# Patient Record
Sex: Male | Born: 1945 | Race: White | Hispanic: No | Marital: Married | Smoking: Former smoker
Health system: Southern US, Community
[De-identification: ages and names within clinical notes are randomized; demographics above are authoritative.]

## PROBLEM LIST (undated history)

## (undated) DIAGNOSIS — N1831 Chronic kidney disease, stage 3a: Secondary | ICD-10-CM

## (undated) DIAGNOSIS — Z860100 Personal history of colon polyps, unspecified: Secondary | ICD-10-CM

## (undated) DIAGNOSIS — G5600 Carpal tunnel syndrome, unspecified upper limb: Secondary | ICD-10-CM

## (undated) DIAGNOSIS — M1990 Unspecified osteoarthritis, unspecified site: Secondary | ICD-10-CM

## (undated) DIAGNOSIS — G2581 Restless legs syndrome: Secondary | ICD-10-CM

## (undated) DIAGNOSIS — K219 Gastro-esophageal reflux disease without esophagitis: Secondary | ICD-10-CM

## (undated) DIAGNOSIS — C4490 Unspecified malignant neoplasm of skin, unspecified: Secondary | ICD-10-CM

## (undated) DIAGNOSIS — K5792 Diverticulitis of intestine, part unspecified, without perforation or abscess without bleeding: Secondary | ICD-10-CM

## (undated) DIAGNOSIS — E119 Type 2 diabetes mellitus without complications: Secondary | ICD-10-CM

## (undated) DIAGNOSIS — M109 Gout, unspecified: Secondary | ICD-10-CM

## (undated) DIAGNOSIS — N400 Enlarged prostate without lower urinary tract symptoms: Secondary | ICD-10-CM

## (undated) DIAGNOSIS — C61 Malignant neoplasm of prostate: Secondary | ICD-10-CM

## (undated) DIAGNOSIS — G4700 Insomnia, unspecified: Secondary | ICD-10-CM

## (undated) DIAGNOSIS — I10 Essential (primary) hypertension: Secondary | ICD-10-CM

## (undated) DIAGNOSIS — J302 Other seasonal allergic rhinitis: Secondary | ICD-10-CM

## (undated) DIAGNOSIS — E7800 Pure hypercholesterolemia, unspecified: Secondary | ICD-10-CM

## (undated) HISTORY — PX: TONSILLECTOMY: SUR1361

## (undated) HISTORY — DX: Pure hypercholesterolemia, unspecified: E78.00

## (undated) HISTORY — DX: Essential (primary) hypertension: I10

## (undated) HISTORY — DX: Other seasonal allergic rhinitis: J30.2

## (undated) HISTORY — DX: Benign prostatic hyperplasia without lower urinary tract symptoms: N40.0

## (undated) HISTORY — DX: Personal history of colon polyps, unspecified: Z86.0100

## (undated) HISTORY — PX: HERNIA REPAIR: SHX51

## (undated) HISTORY — PX: TRIGGER FINGER RELEASE: SHX641

## (undated) HISTORY — PX: APPENDECTOMY: SHX54

## (undated) HISTORY — DX: Insomnia, unspecified: G47.00

## (undated) HISTORY — DX: Type 2 diabetes mellitus without complications: E11.9

## (undated) HISTORY — DX: Personal history of colonic polyps: Z86.010

## (undated) HISTORY — DX: Chronic kidney disease, stage 3a: N18.31

## (undated) HISTORY — DX: Restless legs syndrome: G25.81

## (undated) HISTORY — DX: Unspecified osteoarthritis, unspecified site: M19.90

---

## 2013-04-25 HISTORY — PX: COLONOSCOPY: SHX174

## 2014-05-12 ENCOUNTER — Telehealth: Payer: Self-pay

## 2014-05-12 NOTE — Telephone Encounter (Signed)
Referral notes given to Manuela Schwartz.

## 2014-05-12 NOTE — Telephone Encounter (Signed)
Pt is aware of OV for next Monday at 2pm with EG

## 2014-05-12 NOTE — Telephone Encounter (Signed)
Pt's wife Katharine Look) called to say that they had received a letter from [facility] about setting patient up for a colonoscopy.  Pt's wife said that she didn't think he needed a colonoscopy and he was having problems with his hiatal hernia. She said that Dr Spainhour's office was to send his medical records to [place]. Do you have those in your office?  If I need to make him an office visit I will need those if you have them. Please advise. [phone] or [phone]

## 2014-05-19 ENCOUNTER — Encounter: Payer: Self-pay | Admitting: Nurse Practitioner

## 2014-05-19 ENCOUNTER — Ambulatory Visit (INDEPENDENT_AMBULATORY_CARE_PROVIDER_SITE_OTHER): Payer: Medicare Other | Admitting: Nurse Practitioner

## 2014-05-19 ENCOUNTER — Other Ambulatory Visit: Payer: Self-pay

## 2014-05-19 VITALS — BP 118/70 | HR 87 | Temp 97.6°F | Ht 73.0 in | Wt 212.6 lb

## 2014-05-19 DIAGNOSIS — R0602 Shortness of breath: Secondary | ICD-10-CM | POA: Insufficient documentation

## 2014-05-19 DIAGNOSIS — K449 Diaphragmatic hernia without obstruction or gangrene: Secondary | ICD-10-CM | POA: Insufficient documentation

## 2014-05-19 DIAGNOSIS — R197 Diarrhea, unspecified: Secondary | ICD-10-CM | POA: Insufficient documentation

## 2014-05-19 DIAGNOSIS — R1013 Epigastric pain: Secondary | ICD-10-CM | POA: Insufficient documentation

## 2014-05-19 DIAGNOSIS — R079 Chest pain, unspecified: Secondary | ICD-10-CM

## 2014-05-19 MED ORDER — DICYCLOMINE HCL 10 MG PO CAPS: ORAL_CAPSULE | ORAL | Status: DC

## 2014-05-19 NOTE — Assessment & Plan Note (Signed)
Has diarrhea multiple times a week. Typically wakes up, has a cup of black coffee, small breakfast, and walks on the treadmill. Will then have a normal BM and about an hour later 1-2 additional but loose bowel movements. No recurrence later in the day. Associated with generalized abdominal pain. Just had a colonoscopy in 2015 with Dr. West Carbo with polypectomy x 1 which was dysplastic/adenomatous and reccommended surveillance in 5 years (2020). Will trial bentyl 10 mg once daily in the morning with breakfast to see if this improves his symptoms. Follow-up in 6 weeks.

## 2014-05-19 NOTE — Patient Instructions (Signed)
1. We will refer you to a cardiologist for further evaluation of your symptoms. 2. We will also plan on doing an EGD, possibly in 4 weeks, but would like cardiology to see you and clear you first. 3. For your diarrhea symptoms, start taking Bentyl once a day when you wake up in the morning and have your breakfast. 4. Will have you follow-up with [place] in 6 weeks to evaluate how you're doing, or sooner we can't get the cardiology and EGD done within the next 30 days.

## 2014-05-19 NOTE — Assessment & Plan Note (Signed)
Admits epigastric pain and shortness of breath typically after walking on the treadmill, as well as occasionally at rest, which tends to be relieved with TUMS. Has not mentioned this to his PCP as he attributed it to GI etiology. Has a history of hiatal hernia, per the patient, and occasional GERD symptoms. Likely his symptoms are related to these, but cannot rule out cardiac etiology. Patient referred to cardiology for evaluation and clearence. Will plan for EGD (as stated in hiatal hernia plan) after he has been seen by cards. Will follow-up in 6 weeks to re-evaluate as well as another visit within 30 days of EGD if cannot accomplish cardiology workup and EGD schedule within 30 days.  Proceed with EGD with Dr. Gala Romney in near future: the risks, benefits, and alternatives have been discussed with the patient in detail. The patient states understanding and desires to proceed.

## 2014-05-19 NOTE — Assessment & Plan Note (Signed)
History of hiatal hernia per patient. Feels a bulging in the epigastric region with abdominal flexion, but no real protrusion felt on exam. Having epigastric tenderness and shortness of breath. Rare GERD symptoms. Has never had hiatal hernia evaluated on endoscopy. Will plan for EGD to further evaluate for hernia existence and severity, as well as possible esophagitis/gastritis.  Proceed with EGD with Dr. Gala Romney in near future: the risks, benefits, and alternatives have been discussed with the patient in detail. The patient states understanding and desires to proceed.

## 2014-05-19 NOTE — Progress Notes (Signed)
Primary Care Physician:  Kendrick Ranch, MD Primary Gastroenterologist:  Dr. Gala Romney  Chief Complaint  Patient presents with  . Hiatal Hernia    HPI:   69 year old male presents to establish care, previous MD (Dr. West Carbo) in [place] recently retired. Last colonoscopy on 02/08/14 with polypectomy x 1 and pathology shower one piece hyperplastic and one piece adenomatous, reccommended repeat in 5 years (2020).   Today states he thinks he's having several problems. Has recently started exercising, walking on a treadmill 45 min 4-5 times a week. Starts having epigastric pain and shortness of breath which is relieved by TUMS. Also having occasional spells of shortness of breath with epigastric pain. When he does abdominal exercises "a little ball pokes out" in the epigastric area. Has a history of hiatal hernia and thinks it's attributed to this.  Also c/o "stool irregularity." His first morning stool is a 4 on the [facility] Scale, within 1 hour has another bowel movement which is a 7 on the scale and has to have an additional 1-2 bowel movements before he feels safe leaving the house. Typically he wakes up, walks the treadmill, has black coffee and something to eat. Will then have his first normal BM. Will linger around the house and about an hour later have the loose stool. Has not tried any treatments yet. This typically occurs several times a week. Has abdominal pain along with the loose stools which is relieved with his bowel movement.  Has had 2 occurrences of fecal incontinence as well. Has GERD symptoms about once every 4-8 weeks which is relieved with TUMS. Has not spoken to his PCP about this yet. Denies dysphagia. Denies any other upper or lower GI symptoms.  Past Medical History  Diagnosis Date  . Hypertension   . Hypercholesterolemia   . DM type 2 (diabetes mellitus, type 2)   . Prostate enlargement   . Restless leg syndrome   . Arthritis   . Insomnia   . Seasonal  allergies   . History of colon polyps     2015 polyp x 1 dysplastic and adenomatous pieces.    Past Surgical History  Procedure Laterality Date  . Tonsillectomy    . Appendectomy    . Hernia repair      x2  . Colonoscopy  2015    polypectomy x 1 pieces dysplastic and adenomatous; Recc repeat in 2020 (5 years).    Current Outpatient Prescriptions  Medication Sig Dispense Refill  . alfuzosin (UROXATRAL) 10 MG 24 hr tablet Take 10 mg by mouth daily with breakfast.    . amLODipine (NORVASC) 10 MG tablet Take 10 mg by mouth daily.    Marland Kitchen aspirin EC 81 MG tablet Take 81 mg by mouth daily.    Marland Kitchen atorvastatin (LIPITOR) 20 MG tablet Take 20 mg by mouth daily.    . Cholecalciferol (VITAMIN D3) 2000 UNITS TABS Take 2,000 Units by mouth daily.    . fluticasone (FLONASE) 50 MCG/ACT nasal spray Place 1 spray into both nostrils daily.    Marland Kitchen gabapentin (NEURONTIN) 600 MG tablet Take 600 mg by mouth 2 (two) times daily.    Marland Kitchen glipiZIDE (GLUCOTROL) 5 MG tablet Take 5 mg by mouth daily before breakfast.    . Liraglutide (VICTOZA [place]) Inject 1.2 mg into the skin.    Marland Kitchen losartan-hydrochlorothiazide (HYZAAR) 100-25 MG per tablet Take 1 tablet by mouth daily.    . metFORMIN (GLUCOPHAGE) 1000 MG tablet Take 1,000 mg by mouth 2 (  two) times daily with a meal.    . metoprolol tartrate (LOPRESSOR) 25 MG tablet Take 25 mg by mouth daily.    . Misc Natural Products (BLACK CHERRY CONCENTRATE PO) Take 1,000 mg by mouth daily.    . Potassium Gluconate 595 MG CAPS Take 595 mg by mouth.     No current facility-administered medications for this visit.    Allergies as of 05/19/2014 - Review Complete 05/19/2014  Allergen Reaction Noted  . Ciprofloxacin  05/19/2014  . Flagyl [metronidazole]  05/19/2014    Family History  Problem Relation Age of Onset  . Hypertension Mother   . Heart failure Mother   . Prostate cancer Father   . Hypertension Child   . Hypertension Child   . Hypertension Child   . High Cholesterol  Child   . High Cholesterol Child   . High Cholesterol Child     History   Social History  . Marital Status: Married    Spouse Name: N/A    Number of Children: N/A  . Years of Education: N/A   Occupational History  . Not on file.   Social History Main Topics  . Smoking status: Never Smoker   . Smokeless tobacco: Not on file  . Alcohol Use: 0.0 oz/week    0 Not specified per week     Comment: wine once a month  . Drug Use: No  . Sexual Activity: Not on file   Other Topics Concern  . Not on file   Social History Narrative  . No narrative on file    Review of Systems: Gen: Denies any fever, chills, fatigue, weight loss, lack of appetite.  CV: Denies chest pain, heart palpitations, peripheral edema, syncope.  Resp: Denies shortness of breath at rest or with exertion. Denies wheezing or cough.  GI: See HPI. Denies dysphagia or odynophagia. Denies jaundice, hematemesis, fecal incontinence. GU : Denies urinary burning, urinary frequency, urinary hesitancy MS: Denies joint pain, muscle weakness, cramps, or limitation of movement.  Derm: Denies rash, itching, dry skin Psych: Denies depression, anxiety, memory loss, and confusion Heme: Denies bruising, bleeding, and enlarged lymph nodes.  Physical Exam: BP 118/70 mmHg  Pulse 87  Temp(Src) 97.6 F (36.4 C) (Oral)  Ht 6\' 1"  (1.854 m)  Wt 212 lb 9.6 oz (96.435 kg)  BMI 28.06 kg/m2 General:   Alert and oriented. Pleasant and cooperative. Well-nourished and well-developed.  Head:  Normocephalic and atraumatic. Eyes:  Without icterus, sclera clear and conjunctiva pink.  Ears:  Normal auditory acuity. Mouth:  No deformity or lesions, oral mucosa pink. No OP edema. Neck:  Supple, without mass or thyromegaly. Lungs:  Clear to auscultation bilaterally. No wheezes, rales, or rhonchi. No distress.  Heart:  S1, S2 present without murmurs appreciated.  Abdomen:  +BS, soft, and non-distended. Mild epigastric TTP. No HSM noted. No  guarding or rebound. No masses appreciated. No hernias noted. Rectal:  Deferred  Msk:  Symmetrical without gross deformities. Normal posture. Pulses:  Normal DP pulses noted. Extremities:  Without clubbing or edema. Neurologic:  Alert and  oriented x4;  grossly normal neurologically. Skin:  Intact without significant lesions or rashes. Psych:  Alert and cooperative. Normal mood and affect.     05/19/2014 2:18 PM

## 2014-05-20 NOTE — Progress Notes (Signed)
cc'ed to pcp °

## 2014-07-03 ENCOUNTER — Encounter: Payer: Self-pay | Admitting: Nurse Practitioner

## 2014-07-03 ENCOUNTER — Ambulatory Visit (INDEPENDENT_AMBULATORY_CARE_PROVIDER_SITE_OTHER): Payer: Medicare Other | Admitting: Nurse Practitioner

## 2014-07-03 ENCOUNTER — Encounter (INDEPENDENT_AMBULATORY_CARE_PROVIDER_SITE_OTHER): Payer: Self-pay

## 2014-07-03 DIAGNOSIS — R197 Diarrhea, unspecified: Secondary | ICD-10-CM

## 2014-07-03 NOTE — Patient Instructions (Signed)
1. I have ordered a blood test for you to check for celiac disease 2. Avoid dairy for the next month 3. Return for re-evaluation in 1 month

## 2014-07-03 NOTE — Progress Notes (Signed)
Referring Provider: Jannett Celestine* Primary Care Physician:  Kendrick Ranch, MD Primary GI:  Dr. Gala Romney  Chief Complaint  Patient presents with  . Follow-up    HPI:   69 year old male presents for follow-up on previous visit for epigastric pain, hiatal hernia, and diarrhea. Symptoms at that time included epigastric pain worse with flexion and shortness of breath which was worse with walking on a treadmill and occasionally at rest. TUMS provided some relief. History of occasional GERD symptoms and hiatal hernia. Was referred to cardiology to rule out cardiac etiology and possible EGD after that for GI evaluation of hernia and possible esophagitis/gastritis. Was also complaining of diarrhea multiple times a week in the morning where he would have 1 normal stool and 1-2 additional loose stools about an hour after that. Was given Rx for bentyl and schedule follow-up.   Today states he saw a cardiologist with [facility], had a treadmill stress test which he states was normal. States the Bentyl has not changed his stool pattern much. This pattern started about 12-18 months ago, although he has always had a history of loose bowels. Had a colonoscopy Fall of last year with recommended 5 year follow-up. Frequent morning stools are proceeded by abdominal cramping which is relieved by defecation. His epigastric pain and symptoms have resolved completely. States he had previously been doing abdominal exercises. He since hurt his back and had to stop the exercises. Since then his epigastric symptoms resolved. Was told he may have a small hernia. Denies hematochezia, melena, other abdominal pain, N/V. Has occasional dyspepsia after spicy or acidic foods but they're well controlled with diet control and occasional OTC antacids. Denies any other upper or lower GI symptoms.  Past Medical History  Diagnosis Date  . Hypertension   . Hypercholesterolemia   . DM type 2 (diabetes  mellitus, type 2)   . Prostate enlargement   . Restless leg syndrome   . Arthritis   . Insomnia   . Seasonal allergies   . History of colon polyps     2015 polyp x 1 dysplastic and adenomatous pieces.    Past Surgical History  Procedure Laterality Date  . Tonsillectomy    . Appendectomy    . Hernia repair      inguinal x2  . Colonoscopy  2015    polypectomy x 1 pieces dysplastic and adenomatous; Recc repeat in 2020 (5 years).    Current Outpatient Prescriptions  Medication Sig Dispense Refill  . alfuzosin (UROXATRAL) 10 MG 24 hr tablet Take 10 mg by mouth daily with breakfast.    . amLODipine (NORVASC) 10 MG tablet Take 10 mg by mouth daily.    Marland Kitchen aspirin EC 81 MG tablet Take 81 mg by mouth daily.    Marland Kitchen atorvastatin (LIPITOR) 20 MG tablet Take 20 mg by mouth daily.    . Cholecalciferol (VITAMIN D3) 2000 UNITS TABS Take 2,000 Units by mouth daily.    Marland Kitchen dicyclomine (BENTYL) 10 MG capsule Take 1 tab daily in the morning when waking. 30 capsule 1  . fluticasone (FLONASE) 50 MCG/ACT nasal spray Place 1 spray into both nostrils daily.    Marland Kitchen gabapentin (NEURONTIN) 600 MG tablet Take 600 mg by mouth 2 (two) times daily.    Marland Kitchen glipiZIDE (GLUCOTROL) 5 MG tablet Take 5 mg by mouth daily before breakfast.    . Liraglutide (VICTOZA [place]) Inject 1.2 mg into the skin.    Marland Kitchen losartan-hydrochlorothiazide (HYZAAR) 100-25 MG per tablet  Take 1 tablet by mouth daily.    . metFORMIN (GLUCOPHAGE) 1000 MG tablet Take 1,000 mg by mouth 2 (two) times daily with a meal.    . metoprolol tartrate (LOPRESSOR) 25 MG tablet Take 25 mg by mouth 2 (two) times daily.     . Misc Natural Products (BLACK CHERRY CONCENTRATE PO) Take 1,000 mg by mouth daily.    . Potassium Gluconate 595 MG CAPS Take 595 mg by mouth.     No current facility-administered medications for this visit.    Allergies as of 07/03/2014 - Review Complete 07/03/2014  Allergen Reaction Noted  . Ciprofloxacin  05/19/2014  . Flagyl [metronidazole]   05/19/2014    Family History  Problem Relation Age of Onset  . Hypertension Mother   . Heart failure Mother   . Prostate cancer Father   . Hypertension Child   . Hypertension Child   . Hypertension Child   . High Cholesterol Child   . High Cholesterol Child   . High Cholesterol Child   . Colon cancer Neg Hx     History   Social History  . Marital Status: Married    Spouse Name: N/A  . Number of Children: N/A  . Years of Education: N/A   Social History Main Topics  . Smoking status: Former Smoker    Quit date: 05/20/1979  . Smokeless tobacco: Former Systems developer    Quit date: 05/20/1979  . Alcohol Use: 0.0 oz/week    0 Standard drinks or equivalent per week     Comment: wine once a month  . Drug Use: No  . Sexual Activity: Not on file   Other Topics Concern  . None   Social History Narrative    Review of Systems: Gen: Denies fever, chills, anorexia. Denies fatigue, weight loss, change in appetite. CV: Denies chest pain, palpitations, syncope. Resp: Denies dyspnea at rest, wheezing, coughing up blood. GI: Denies vomiting blood, jaundice, and fecal incontinence. Denies dysphagia or odynophagia. Derm: Denies rash, itching, dry skin Psych: Denies depression, anxiety, memory loss, confusion. Heme: Denies bruising, bleeding, and enlarged lymph nodes.  Physical Exam: BP 118/70 mmHg  Pulse 73  Temp(Src) 98.1 F (36.7 C) (Oral)  Ht 6' (1.829 m)  Wt 212 lb (96.163 kg)  BMI 28.75 kg/m2 General:   Alert and oriented. No distress noted. Pleasant and cooperative.  Neck:  Supple, without mass or thyromegaly. Lungs:  Clear to auscultation bilaterally. No wheezes, rales, or rhonchi. No distress.  Heart:  S1, S2 present without murmurs, rubs, or gallops. Regular rate and rhythm. Abdomen:  +BS, soft, non-tender and non-distended. No rebound or guarding. No HSM or masses noted. With demonstrated abdominal exercises noted very minor possible hernia protrusion after several  repetitions; not apparent otherwise. Extremities:  Without edema. Neurologic:  Alert and  oriented x4;  grossly normal neurologically. Skin:  Intact without significant lesions or rashes. Psych:  Alert and cooperative. Normal mood and affect.    07/03/2014 10:31 AM

## 2014-07-03 NOTE — Assessment & Plan Note (Addendum)
Daily parretn of one normal bowel movement followed by 2-3 loose stools afterward with some associated abdominal cramping that is relieved by defecation. Denies any red flag/warning signs like gi bleed, severe abdominal pain, weight loss, change in appetite. Bentyl with little to no effect. Will advise to avoid daily products and check tissue transglutaminase IgA for celiac. Follow-up in 4 months. Other etiologies include IBS-D, pancreatic insufficiency. Full cardiac records requested. If labs return normal and no improvement with dietary modification will discuss with Dr. Gala Romney for further insight.

## 2014-07-04 ENCOUNTER — Telehealth: Payer: Self-pay | Admitting: Nurse Practitioner

## 2014-07-04 LAB — TISSUE TRANSGLUTAMINASE, IGA: TISSUE TRANSGLUTAMINASE AB, IGA: 1 U/mL (ref <4)

## 2014-07-04 NOTE — Telephone Encounter (Signed)
His tissue transglutaminase IgA was marked reviewed in error. Please notify patient it was negative (no apparent allergy to gluten.) Continue to avoid dairy and follow the plan as we discussed. Return per scheduled appointment or sooner for worsening symptoms. Please notify patient.

## 2014-07-04 NOTE — Progress Notes (Signed)
CC'ED TO PCP 

## 2014-07-07 NOTE — Telephone Encounter (Signed)
Pt is aware of results. 

## 2014-07-30 ENCOUNTER — Telehealth: Payer: Self-pay | Admitting: Nurse Practitioner

## 2014-07-30 NOTE — Telephone Encounter (Signed)
PATIENT WIFE SANDRA CALLED AND STATED THAT THE PATIENT HAS NOT FOLLOWED THE DAIRY FREE DIET THAT WAS RECOMMENDED AND WANTED TO KNOW IF THAT WAS WHAT WAS BEING LOOKED AT WAS IF THE PATIENT WAS LACTOSE INTOLERANT.  IF SO SHOULD HE STILL KEEP HIS APPOINTMENT Monday OR SHOULD HE TRY OTC LACTOSE PILLS  PLEASE ADVISE [phone]

## 2014-07-31 NOTE — Telephone Encounter (Signed)
Routing to EG 

## 2014-08-01 NOTE — Telephone Encounter (Signed)
Yes it is one option for what's being looked at. I would again recommend abstaining from dairy product for at least a full week to give [place] an idea if his symptoms improve by doing so. May need to push his appointment back some to allow for this. Can keep appointment if symptoms are severe.

## 2014-08-04 ENCOUNTER — Ambulatory Visit: Payer: Medicare Other | Admitting: Nurse Practitioner

## 2014-08-04 NOTE — Telephone Encounter (Signed)
Tried to call pt- LMOM with instructions.  

## 2014-08-04 NOTE — Telephone Encounter (Signed)
pts ov has been changed to 08/20/14.

## 2014-08-20 ENCOUNTER — Encounter: Payer: Self-pay | Admitting: Nurse Practitioner

## 2014-08-20 ENCOUNTER — Ambulatory Visit (INDEPENDENT_AMBULATORY_CARE_PROVIDER_SITE_OTHER): Payer: Medicare Other | Admitting: Nurse Practitioner

## 2014-08-20 VITALS — BP 108/69 | HR 72 | Temp 97.0°F | Ht 72.0 in | Wt 212.2 lb

## 2014-08-20 DIAGNOSIS — R197 Diarrhea, unspecified: Secondary | ICD-10-CM | POA: Diagnosis not present

## 2014-08-20 DIAGNOSIS — R1013 Epigastric pain: Secondary | ICD-10-CM

## 2014-08-20 MED ORDER — HYOSCYAMINE SULFATE 0.125 MG SL SUBL: 0.1250 mg | SUBLINGUAL_TABLET | SUBLINGUAL | Status: DC | PRN

## 2014-08-20 NOTE — Progress Notes (Signed)
Referring Provider: Jannett Celestine* Primary Care Physician:  Kendrick Ranch, MD Primary GI: Dr. Gala Romney  Chief Complaint  Patient presents with  . Follow-up    HPI:   69 year old male presents for follow-up on abdominal pain and diarrhea. Previously was attempted on Bentyl with little no effect. Tissue transglutaminase IgA negative for celiac disease. Been seen and evaluated by cardiology who pursued and exercise stress test and echo both of which were normal per the results received. Reviewed cardiology notes and full.  Today he states he has avoided daily fairly diligently without any improvement in symptoms. Will still have an initial morning bowel movement which is consistent with [place] 4. Will have 2-3 additional episodes after that, with about an hour in between, which are [place] 5-7. Has kept a diligent foot and stool diary. He does not have further symptoms later in the day, it is only in the morning. Denies hematochezia and melena. Will have abdominal discomfort prior to his morning bowel movement which is relieved with defecation. His GERD symptoms have since subsided. Symptoms improved with slowing his eating. Wants to hold off on endoscopy for now.   Past Medical History  Diagnosis Date  . Hypertension   . Hypercholesterolemia   . DM type 2 (diabetes mellitus, type 2)   . Prostate enlargement   . Restless leg syndrome   . Arthritis   . Insomnia   . Seasonal allergies   . History of colon polyps     2015 polyp x 1 dysplastic and adenomatous pieces.    Past Surgical History  Procedure Laterality Date  . Tonsillectomy    . Appendectomy    . Hernia repair      inguinal x2  . Colonoscopy  2015    polypectomy x 1 pieces dysplastic and adenomatous; Recc repeat in 2020 (5 years).    Current Outpatient Prescriptions  Medication Sig Dispense Refill  . alfuzosin (UROXATRAL) 10 MG 24 hr tablet Take 10 mg by mouth daily with breakfast.    .  amLODipine (NORVASC) 10 MG tablet Take 10 mg by mouth daily.    Marland Kitchen aspirin EC 81 MG tablet Take 81 mg by mouth daily.    Marland Kitchen atorvastatin (LIPITOR) 20 MG tablet Take 20 mg by mouth daily.    . Cholecalciferol (VITAMIN D3) 2000 UNITS TABS Take 2,000 Units by mouth daily.    Marland Kitchen dicyclomine (BENTYL) 10 MG capsule Take 1 tab daily in the morning when waking. 30 capsule 1  . fluticasone (FLONASE) 50 MCG/ACT nasal spray Place 1 spray into both nostrils daily.    Marland Kitchen gabapentin (NEURONTIN) 600 MG tablet Take 600 mg by mouth 2 (two) times daily.    Marland Kitchen glipiZIDE (GLUCOTROL) 5 MG tablet Take 5 mg by mouth daily before breakfast.    . Liraglutide (VICTOZA [place]) Inject 1.2 mg into the skin.    Marland Kitchen losartan-hydrochlorothiazide (HYZAAR) 100-25 MG per tablet Take 1 tablet by mouth daily.    . metFORMIN (GLUCOPHAGE) 1000 MG tablet Take 1,000 mg by mouth 2 (two) times daily with a meal.    . metoprolol tartrate (LOPRESSOR) 25 MG tablet Take 25 mg by mouth 2 (two) times daily.     . Misc Natural Products (BLACK CHERRY CONCENTRATE PO) Take 1,000 mg by mouth daily.    . Potassium Gluconate 595 MG CAPS Take 595 mg by mouth.     No current facility-administered medications for this visit.    Allergies as of 08/20/2014 - Review Complete  08/20/2014  Allergen Reaction Noted  . Ciprofloxacin  05/19/2014  . Flagyl [metronidazole]  05/19/2014    Family History  Problem Relation Age of Onset  . Hypertension Mother   . Heart failure Mother   . Prostate cancer Father   . Hypertension Child   . Hypertension Child   . Hypertension Child   . High Cholesterol Child   . High Cholesterol Child   . High Cholesterol Child   . Colon cancer Neg Hx     History   Social History  . Marital Status: Married    Spouse Name: N/A  . Number of Children: N/A  . Years of Education: N/A   Social History Main Topics  . Smoking status: Former Smoker    Quit date: 05/20/1979  . Smokeless tobacco: Former Systems developer    Quit date: 05/20/1979   . Alcohol Use: 0.0 oz/week    0 Standard drinks or equivalent per week     Comment: wine once a month  . Drug Use: No  . Sexual Activity: Not on file   Other Topics Concern  . None   Social History Narrative    Review of Systems: General: Negative for anorexia, weight loss, fever, chills, fatigue, weakness. Eyes: Negative for vision changes.  CV: Negative for chest pain, angina, palpitations, peripheral edema.  Respiratory: Negative for dyspnea at rest, cough, wheezing.  GI: See history of present illness. Derm: Negative for rash or itching.  Neuro: Negative for weakness, seizure, memory loss, confusion.  Psych: Negative for anxiety, depression.  Endo: Negative for unusual weight change.  Heme: Negative for bruising or bleeding. Allergy: Negative for rash or hives.   Physical Exam: BP 108/69 mmHg  Pulse 72  Temp(Src) 97 F (36.1 C)  Ht 6' (1.829 m)  Wt 212 lb 3.2 oz (96.253 kg)  BMI 28.77 kg/m2 General:   Alert and oriented. No distress noted. Pleasant and cooperative.  Head:  Normocephalic and atraumatic. Eyes:  Conjuctiva clear without scleral icterus. Lungs:  Clear to auscultation bilaterally. No wheezes, rales, or rhonchi. No distress.  Heart:  S1, S2 present without murmurs, rubs, or gallops. Regular rate and rhythm. Abdomen:  +BS, soft, non-tender and non-distended. No rebound or guarding. No HSM or masses noted. Msk:  Symmetrical without gross deformities. Normal posture. Extremities:  Without edema. Neurologic:  Alert and  oriented x4;  grossly normal neurologically. Skin:  Intact without significant lesions or rashes. Psych:  Alert and cooperative. Normal mood and affect.    08/20/2014 1:51 PM

## 2014-08-20 NOTE — Assessment & Plan Note (Signed)
Patient with continued abdominal pain and unusual bowel pattern. Recent colonoscopy normal except for polypectomy last year. He continues to wake up in the morning approximately 30 minutes after eating has a single bowel movement which is normal, 30-60 minutes later has a second and then third and and often fourth bowel movement which is progressively loose to the point of diarrhea. Has tried Bentyl without relief. Tissue transglutaminase IgA negative. Has done a trial period of diligent dairy avoidance with no improvement. At this point potentially due to incomplete emptying and will attempt to bulk his stools. Recommend fiber supplementation, probiotic, and Levsin 0.125 mg sublingual every 4 hours as needed. Return for reevaluation in 8 weeks. No red flag/warning signs or symptoms at this ti follow-up can continue further workup if no improvement to include possible IBS D.

## 2014-08-20 NOTE — Patient Instructions (Signed)
1. I'm calling in a prescription for Levsin 0.25 mg. 1 to resolve tablet under your tongue up to every 4 hours as needed. Initially start taking in the morning upon waking before eating or drinking anything. 2. Add a fiber supplement. There are several good over-the-counter supplements such as fiber dummies, Benefiber, and others. He can consult with the pharmacist to help select the best time for you. 3. Start taking a daily probiotic. These were also over-the-counter in her several good options. I provided you with a coupon for money off on one of these options. Feel free to consult with the pharmacist for select in the best time for you. 4. Return for follow-up in 8 weeks with Dr. Gala Romney.

## 2014-08-25 ENCOUNTER — Encounter: Payer: Self-pay | Admitting: Internal Medicine

## 2014-08-25 ENCOUNTER — Telehealth: Payer: Self-pay | Admitting: Internal Medicine

## 2014-08-25 MED ORDER — HYOSCYAMINE SULFATE 0.125 MG SL SUBL: 0.1250 mg | SUBLINGUAL_TABLET | SUBLINGUAL | Status: DC | PRN

## 2014-08-25 NOTE — Telephone Encounter (Signed)
Pt was seen recently be [facility] and the rx (Levsin) was written for twice a day, but the pharmacy only filled it with 30 pills not a 30 day supply. Patient is going out of town leaving possibly tomorrow or either Wednesday morning and needs this corrected. He uses [facility] [address] in [place]. Any questions call his cell at [phone]

## 2014-08-25 NOTE — Telephone Encounter (Signed)
I spoke with the pt, he said so far this is working well for him. His rx says to take it qid prn and he is taking it bid prn.  He is going out of town next week and will run out before he gets back. He is aware that he needs ov with RMR in 8 weeks. Spoke with EG ok to re-send in rx with 2 refills. rx has been sent in.  Pt said he was not scheduled with RMR yet. Please make sure pt gets an appt with RMR in 8 weeks.

## 2014-08-25 NOTE — Telephone Encounter (Signed)
APPOINTMENT MADE AND LETTER SENT °

## 2014-08-26 NOTE — Progress Notes (Signed)
CC'ED TO PCP 

## 2014-10-20 ENCOUNTER — Ambulatory Visit (INDEPENDENT_AMBULATORY_CARE_PROVIDER_SITE_OTHER): Payer: Medicare Other | Admitting: Internal Medicine

## 2014-10-20 ENCOUNTER — Encounter: Payer: Self-pay | Admitting: Internal Medicine

## 2014-10-20 VITALS — BP 119/71 | HR 73 | Temp 96.9°F | Ht 72.0 in | Wt 212.0 lb

## 2014-10-20 DIAGNOSIS — K589 Irritable bowel syndrome without diarrhea: Secondary | ICD-10-CM | POA: Diagnosis not present

## 2014-10-20 MED ORDER — HYOSCYAMINE SULFATE 0.125 MG SL SUBL: 0.1250 mg | SUBLINGUAL_TABLET | Freq: Every day | SUBLINGUAL | Status: DC

## 2014-10-20 NOTE — Progress Notes (Signed)
Primary Care Physician:  Kendrick Ranch, MD Primary Gastroenterologist:  Dr. Gala Romney  Pre-Procedure History & Physical: HPI:  Glen Guerrero. is a 69 y.o. male here for  Follow-up of early morning diarrhea. Since starting Levsin one dose each morning,  fiber and probiotic, patient states bowel function has normalized; he does have 1-2 bowel movements in rapid succession in the mornings  - sometimes it is loose. However, only 2 daily. No episodes of incontinence since last visit. No bleeding. History of adenoma removed from his colon last year. Will be due for surveillance examination in 4 years. He has been on and off metformin over the years.  Past Medical History  Diagnosis Date  . Hypertension   . Hypercholesterolemia   . DM type 2 (diabetes mellitus, type 2)   . Prostate enlargement   . Restless leg syndrome   . Arthritis   . Insomnia   . Seasonal allergies   . History of colon polyps     2015 polyp x 1 dysplastic and adenomatous pieces.    Past Surgical History  Procedure Laterality Date  . Tonsillectomy    . Appendectomy    . Hernia repair      inguinal x2  . Colonoscopy  2015    Dr.Spainhour- polypectomy x 1 pieces dysplastic and adenomatous; Recc repeat in 2020 (5 years).    Prior to Admission medications   Medication Sig Start Date End Date Taking? Authorizing Provider  alfuzosin (UROXATRAL) 10 MG 24 hr tablet Take 10 mg by mouth daily with breakfast.   Yes Historical Provider, MD  amLODipine (NORVASC) 10 MG tablet Take 10 mg by mouth daily.   Yes Historical Provider, MD  aspirin EC 81 MG tablet Take 81 mg by mouth daily.   Yes Historical Provider, MD  atorvastatin (LIPITOR) 20 MG tablet Take 20 mg by mouth daily.   Yes Historical Provider, MD  Cholecalciferol (VITAMIN D3) 2000 UNITS TABS Take 2,000 Units by mouth daily.   Yes Historical Provider, MD  dicyclomine (BENTYL) 10 MG capsule Take 1 tab daily in the morning when waking. 05/19/14  Yes Carlis Stable, NP  fluticasone (FLONASE) 50 MCG/ACT nasal spray Place 1 spray into both nostrils daily.   Yes Historical Provider, MD  gabapentin (NEURONTIN) 600 MG tablet Take 600 mg by mouth 2 (two) times daily.   Yes Historical Provider, MD  glipiZIDE (GLUCOTROL) 5 MG tablet Take 5 mg by mouth daily before breakfast.   Yes Historical Provider, MD  hyoscyamine (LEVSIN/SL) 0.125 MG SL tablet Place 1 tablet (0.125 mg total) under the tongue every 4 (four) hours as needed. 08/25/14  Yes Carlis Stable, NP  Liraglutide (VICTOZA [place]) Inject 1.2 mg into the skin.   Yes Historical Provider, MD  losartan-hydrochlorothiazide (HYZAAR) 100-25 MG per tablet Take 1 tablet by mouth daily.   Yes Historical Provider, MD  metFORMIN (GLUCOPHAGE) 1000 MG tablet Take 1,000 mg by mouth 2 (two) times daily with a meal.   Yes Historical Provider, MD  metoprolol tartrate (LOPRESSOR) 25 MG tablet Take 25 mg by mouth 2 (two) times daily.    Yes Historical Provider, MD  Misc Natural Products (BLACK CHERRY CONCENTRATE PO) Take 1,000 mg by mouth daily.   Yes Historical Provider, MD  Potassium Gluconate 595 MG CAPS Take 595 mg by mouth.   Yes Historical Provider, MD  Probiotic Product (ALIGN) 4 MG CAPS Take 4 mg by mouth daily.   Yes Historical Provider, MD  Allergies as of 10/20/2014 - Review Complete 10/20/2014  Allergen Reaction Noted  . Ciprofloxacin  05/19/2014  . Flagyl [metronidazole]  05/19/2014    Family History  Problem Relation Age of Onset  . Hypertension Mother   . Heart failure Mother   . Prostate cancer Father   . Hypertension Child   . Hypertension Child   . Hypertension Child   . High Cholesterol Child   . High Cholesterol Child   . High Cholesterol Child   . Colon cancer Neg Hx     History   Social History  . Marital Status: Married    Spouse Name: N/A  . Number of Children: N/A  . Years of Education: N/A   Occupational History  . Not on file.   Social History Main Topics  . Smoking status:  Former Smoker    Quit date: 05/20/1979  . Smokeless tobacco: Former Systems developer    Quit date: 05/20/1979  . Alcohol Use: 0.0 oz/week    0 Standard drinks or equivalent per week     Comment: wine once a month  . Drug Use: No  . Sexual Activity: Not on file   Other Topics Concern  . Not on file   Social History Narrative    Review of Systems: See HPI, otherwise negative ROS  Physical Exam: BP 119/71 mmHg  Pulse 73  Temp(Src) 96.9 F (36.1 C) (Oral)  Ht 6' (1.829 m)  Wt 212 lb (96.163 kg)  BMI 28.75 kg/m2 General:   Alert,  Well-developed, well-nourished, pleasant and cooperative in NAD. Accompanied by spouse the Skin:  Intact without significant lesions or rashes. Eyes:  Sclera clear, no icterus.   Conjunctiva pink. Ears:  Normal auditory acuity. Nose:  No deformity, discharge,  or lesions. Mouth:  No deformity or lesions. Neck:  Supple; no masses or thyromegaly. No significant cervical adenopathy. Lungs:  Clear throughout to auscultation.   No wheezes, crackles, or rhonchi. No acute distress. Heart:  Regular rate and rhythm; no murmurs, clicks, rubs,  or gallops. Abdomen: Non-distended, normal bowel sounds.  Soft and nontender without appreciable mass or hepatosplenomegaly.  Pulses:  Normal pulses noted. Extremities:  Without clubbing or edema.  Impression:  69 year old gentleman with early intermittent early morning diarrhea likely related to an element of irritable bowel syndrome. Diabetic enteropathy and medication effect (metformin)  not excluded. Clinically, marked improvement with the new regimen instituted since his last office visit as outlined above.  Recommendations:  Continue Levsin daily.  Continue fiber daily - try one dose daily.  Continue probiotic daily.Repeat colonoscopy in 4 years. Office visit in 6 months      Notice: This dictation was prepared with Dragon dictation along with smaller phrase technology. Any transcriptional errors that result from this  process are unintentional and may not be corrected upon review.

## 2014-10-20 NOTE — Patient Instructions (Signed)
Continue Levsin daily  Continue fiber daily - try one dose daily  Continue probiotic daily.  Repeat colonoscopy in 4 years  Office visit in 6 months

## 2015-03-11 ENCOUNTER — Encounter: Payer: Self-pay | Admitting: Internal Medicine

## 2017-10-24 ENCOUNTER — Encounter

## 2017-10-24 ENCOUNTER — Other Ambulatory Visit: Payer: Self-pay | Admitting: *Deleted

## 2017-10-24 ENCOUNTER — Telehealth: Payer: Self-pay | Admitting: *Deleted

## 2017-10-24 ENCOUNTER — Ambulatory Visit (INDEPENDENT_AMBULATORY_CARE_PROVIDER_SITE_OTHER): Payer: Medicare Other | Admitting: Internal Medicine

## 2017-10-24 ENCOUNTER — Encounter: Payer: Self-pay | Admitting: *Deleted

## 2017-10-24 ENCOUNTER — Encounter: Payer: Self-pay | Admitting: Internal Medicine

## 2017-10-24 VITALS — BP 112/72 | HR 55 | Temp 97.1°F | Ht 72.0 in | Wt 213.2 lb

## 2017-10-24 DIAGNOSIS — K921 Melena: Secondary | ICD-10-CM

## 2017-10-24 DIAGNOSIS — Z86010 Personal history of colonic polyps: Secondary | ICD-10-CM

## 2017-10-24 DIAGNOSIS — K580 Irritable bowel syndrome with diarrhea: Secondary | ICD-10-CM

## 2017-10-24 MED ORDER — NA SULFATE-K SULFATE-MG SULF 17.5-3.13-1.6 GM/177ML PO SOLN: 1.0000 | ORAL | 0 refills | Status: DC

## 2017-10-24 NOTE — Patient Instructions (Addendum)
Schedule a diagnostic colonoscopy - hematochezia and hx of polyps - conscious sedation  Pamphlet on hemorrhoid banding provided  Further recommendations to follow

## 2017-10-24 NOTE — Telephone Encounter (Signed)
Per RMR diabetic instructions for TCS: No glucotrol day of procedure, no jardiance day before/day of procedure, lantus 10 units at bedtime the night before procedure. Will provide instructions for lantus after procedure.

## 2017-10-24 NOTE — Progress Notes (Signed)
Primary Care Physician:  Kendrick Ranch, MD Primary Gastroenterologist:  Dr. Gala Romney  Pre-Procedure History & Physical: HPI:  Glen Guerrero. is a 72 y.o. male here for Further evaluation of rectal bleeding. Patient states he has known hemorrhoids which protrude from time to time when he is having a bowel movement. Bleeding has been noted from time to time recently with wiping - sometimes at other times as well. His IBS symptoms have improved since he came off metformin.  History colonic adenomas removed in [place]; due for surveillance colonoscopy 2020. Hasn't had any upper GI tract symptoms such as odynophagia, dysphagia, early satiety, nausea vomiting. He denies melena. Previously prescribed Levsin for IBS but since coming off metformin hasn't needed to take anything. He does sometimes spend a prolonged amount of time on the toilet in achieving a bowel movement.  Past Medical History:  Diagnosis Date  . Arthritis   . DM type 2 (diabetes mellitus, type 2) ([place])   . History of colon polyps    2015 polyp x 1 dysplastic and adenomatous pieces.  . Hypercholesterolemia   . Hypertension   . Insomnia   . Prostate enlargement   . Restless leg syndrome   . Seasonal allergies     Prior to Admission medications   Medication Sig Start Date End Date Taking? Authorizing Provider  alfuzosin (UROXATRAL) 10 MG 24 hr tablet Take 10 mg by mouth daily with breakfast.   Yes [provider]  amLODipine (NORVASC) 10 MG tablet Take 10 mg by mouth daily.   Yes [provider]  aspirin EC 81 MG tablet Take 81 mg by mouth daily.   Yes [provider]  atorvastatin (LIPITOR) 20 MG tablet Take 20 mg by mouth daily.   Yes [provider]  Cholecalciferol (VITAMIN D3) 2000 UNITS TABS Take 2,000 Units by mouth daily.   Yes [provider]  gabapentin (NEURONTIN) 600 MG tablet Take 1,200 mg by mouth at bedtime.    Yes [provider]    glipiZIDE (GLUCOTROL) 5 MG tablet Take 5 mg by mouth daily before breakfast.   Yes [provider]  JARDIANCE 25 MG TABS tablet Take 25 mg by mouth daily. 08/19/17  Yes [provider]  LANTUS SOLOSTAR 100 UNIT/ML Solostar Pen Inject 36 Units into the skin at bedtime. 10/05/17  Yes [provider]  loratadine (CLARITIN) 10 MG tablet Take 10 mg by mouth daily as needed for allergies.   Yes [provider]  losartan-hydrochlorothiazide (HYZAAR) 100-25 MG per tablet Take 0.5 tablets by mouth daily.    Yes [provider]  metoprolol tartrate (LOPRESSOR) 25 MG tablet Take 25 mg by mouth 2 (two) times daily.    Yes [provider]  Misc Natural Products (BLACK CHERRY CONCENTRATE PO) Take 1,000 mg by mouth 2 (two) times daily.    Yes [provider]  NON FORMULARY Uses CPAP nightly   Yes [provider]  Potassium Gluconate 595 MG CAPS Take 595 mg by mouth.   Yes [provider]  dicyclomine (BENTYL) 10 MG capsule Take 1 tab daily in the morning when waking. Patient not taking: Reported on 10/24/2017 05/19/14   Carlis Stable, NP  fluticasone [facility]) 50 MCG/ACT nasal spray Place 1 spray into both nostrils daily.    [provider]  hyoscyamine (LEVSIN SL) 0.125 MG SL tablet Place 1 tablet (0.125 mg total) under the tongue daily. Patient not taking: Reported on 10/24/2017 10/20/14  Anglia Blakley, Cristopher Estimable, MD  hyoscyamine (LEVSIN/SL) 0.125 MG SL tablet Place 1 tablet (0.125 mg total) under the tongue every 4 (four) hours as needed. Patient not taking: Reported on 10/24/2017 08/25/14   Carlis Stable, NP  Liraglutide (VICTOZA [place]) Inject 1.2 mg into the skin.    [provider]  metFORMIN (GLUCOPHAGE) 1000 MG tablet Take 1,000 mg by mouth 2 (two) times daily with a meal.    [provider]  Probiotic Product (ALIGN) 4 MG CAPS Take 4 mg by mouth daily.    [provider]    Allergies as of 10/24/2017 - Review  Complete 10/24/2017  Allergen Reaction Noted  . Ciprofloxacin  05/19/2014  . Flagyl [metronidazole]  05/19/2014  . Tramadol Nausea And Vomiting 10/24/2017    Family History  Problem Relation Age of Onset  . Hypertension Mother   . Heart failure Mother   . Prostate cancer Father   . Hypertension Child   . Hypertension Child   . Hypertension Child   . High Cholesterol Child   . High Cholesterol Child   . High Cholesterol Child   . Colon cancer Neg Hx     Social History   Socioeconomic History  . Marital status: Married    Spouse name: Not on file  . Number of children: Not on file  . Years of education: Not on file  . Highest education level: Not on file  Occupational History  . Not on file  Social Needs  . Financial resource strain: Not on file  . Food insecurity:    Worry: Not on file    Inability: Not on file  . Transportation needs:    Medical: Not on file    Non-medical: Not on file  Tobacco Use  . Smoking status: Former Smoker    Last attempt to quit: 05/20/1979    Years since quitting: 38.4  . Smokeless tobacco: Former Systems developer    Quit date: 05/20/1979  Substance and Sexual Activity  . Alcohol use: Yes    Alcohol/week: 0.0 oz    Comment: wine once a week; occ whiskey  . Drug use: No  . Sexual activity: Not on file  Lifestyle  . Physical activity:    Days per week: Not on file    Minutes per session: Not on file  . Stress: Not on file  Relationships  . Social connections:    Talks on phone: Not on file    Gets together: Not on file    Attends religious service: Not on file    Active member of club or organization: Not on file    Attends meetings of clubs or organizations: Not on file    Relationship status: Not on file  . Intimate partner violence:    Fear of current or ex partner: Not on file    Emotionally abused: Not on file    Physically abused: Not on file    Forced sexual activity: Not on file  Other Topics Concern  . Not on file  Social  History Narrative  . Not on file    Review of Systems: See HPI, otherwise negative ROS  Physical Exam: BP 112/72   Pulse (!) 55   Temp (!) 97.1 F (36.2 C) (Oral)   Ht 6' (1.829 m)   Wt 213 lb 3.2 oz (96.7 kg)   BMI 28.92 kg/m  General:   Alert,  , pleasant and cooperative in NAD - accompanied by spouse Neck:  Supple; no  masses or thyromegaly. No significant cervical adenopathy. Lungs:  Clear throughout to auscultation.   No wheezes, crackles, or rhonchi. No acute distress. Heart:  Regular rate and rhythm; no murmurs, clicks, rubs,  or gallops. Abdomen: Non-distended, normal bowel sounds.  Soft and nontender without appreciable mass or hepatosplenomegaly.  Pulses:  Normal pulses noted. Extremities:  Without clubbing or edema. Rectal:  small grade 3 hemorrhoid tags present. Easily reducible. No mass in the rectal vault scant brown stool Hemoccult negative  Impression:  Pleasant 72 year old gentleman who appears to have symptomatic grade 3 hemorrhoids in a background of IBS D- the latter problem quiscient lately. History of colonic adenomas removed elsewhere; due for surveillance next year. History of failing conservative topical treatments for his symptomatic hemorrhoid disease. He may benefit from hemorrhoid banding. However, he may have other cause of rectal bleeding; he has a history of colonic polyps  Recommendations: I told the patient he should go ahead and have  colonoscopy to make sure nothing else is going on. Assuming colonoscopy turns out okay, would offer in office hemorrhoid banding in the near future, explaining the procedure to him and his wife. There are multiple treatment modalities for hemorrhoids. Banding is one effective treatment  -  although no 100% guarantee can be made that all of his hemorrhoidal symptoms would resolve permanently with banding. We will schedule a diagnostic colonoscopy - hematochezia and hx of polyps - conscious sedation.  The risks, benefits,  limitations, alternatives and imponderables have been reviewed with the patient. Questions have been answered. All parties are agreeable.   Pamphlet on hemorrhoid banding provided  Further recommendations to follow                            Notice: This dictation was prepared with Dragon dictation along with smaller phrase technology. Any transcriptional errors that result from this process are unintentional and may not be corrected upon review.

## 2017-12-05 ENCOUNTER — Ambulatory Visit (HOSPITAL_COMMUNITY)
Admission: RE | Admit: 2017-12-05 | Discharge: 2017-12-05 | Disposition: A | Discharge status: Home / Self Care | Payer: Medicare Other | Source: Ambulatory Visit | Attending: Internal Medicine | Admitting: Internal Medicine

## 2017-12-05 ENCOUNTER — Encounter (HOSPITAL_COMMUNITY): Payer: Self-pay

## 2017-12-05 ENCOUNTER — Telehealth: Payer: Self-pay | Admitting: Internal Medicine

## 2017-12-05 ENCOUNTER — Other Ambulatory Visit: Payer: Self-pay

## 2017-12-05 ENCOUNTER — Encounter (HOSPITAL_COMMUNITY)
Admission: RE | Disposition: A | Discharge status: Home / Self Care | Payer: Self-pay | Source: Ambulatory Visit | Attending: Internal Medicine

## 2017-12-05 DIAGNOSIS — N400 Enlarged prostate without lower urinary tract symptoms: Secondary | ICD-10-CM | POA: Insufficient documentation

## 2017-12-05 DIAGNOSIS — D124 Benign neoplasm of descending colon: Secondary | ICD-10-CM

## 2017-12-05 DIAGNOSIS — Z86010 Personal history of colonic polyps: Secondary | ICD-10-CM | POA: Diagnosis not present

## 2017-12-05 DIAGNOSIS — Z7982 Long term (current) use of aspirin: Secondary | ICD-10-CM | POA: Insufficient documentation

## 2017-12-05 DIAGNOSIS — E119 Type 2 diabetes mellitus without complications: Secondary | ICD-10-CM | POA: Insufficient documentation

## 2017-12-05 DIAGNOSIS — I10 Essential (primary) hypertension: Secondary | ICD-10-CM | POA: Insufficient documentation

## 2017-12-05 DIAGNOSIS — Z79899 Other long term (current) drug therapy: Secondary | ICD-10-CM | POA: Diagnosis not present

## 2017-12-05 DIAGNOSIS — K921 Melena: Secondary | ICD-10-CM | POA: Diagnosis not present

## 2017-12-05 DIAGNOSIS — K5730 Diverticulosis of large intestine without perforation or abscess without bleeding: Secondary | ICD-10-CM | POA: Diagnosis not present

## 2017-12-05 DIAGNOSIS — K642 Third degree hemorrhoids: Secondary | ICD-10-CM | POA: Diagnosis not present

## 2017-12-05 DIAGNOSIS — Z794 Long term (current) use of insulin: Secondary | ICD-10-CM | POA: Diagnosis not present

## 2017-12-05 DIAGNOSIS — E7800 Pure hypercholesterolemia, unspecified: Secondary | ICD-10-CM | POA: Insufficient documentation

## 2017-12-05 DIAGNOSIS — K644 Residual hemorrhoidal skin tags: Secondary | ICD-10-CM | POA: Diagnosis not present

## 2017-12-05 DIAGNOSIS — K625 Hemorrhage of anus and rectum: Secondary | ICD-10-CM | POA: Diagnosis present

## 2017-12-05 HISTORY — PX: POLYPECTOMY: SHX5525

## 2017-12-05 HISTORY — PX: COLONOSCOPY: SHX5424

## 2017-12-05 LAB — GLUCOSE, CAPILLARY: GLUCOSE-CAPILLARY: 123 mg/dL — AB (ref 70–99)

## 2017-12-05 SURGERY — COLONOSCOPY
Anesthesia: Moderate Sedation

## 2017-12-05 MED ORDER — ONDANSETRON HCL 4 MG/2ML IJ SOLN
INTRAMUSCULAR | Status: DC | PRN
  Administered 2017-12-05: 4 mg via INTRAVENOUS

## 2017-12-05 MED ORDER — MEPERIDINE HCL 100 MG/ML IJ SOLN
INTRAMUSCULAR | Status: AC
  Filled 2017-12-05: qty 2

## 2017-12-05 MED ORDER — STERILE WATER FOR IRRIGATION IR SOLN
Status: DC | PRN
  Administered 2017-12-05: 11:00:00

## 2017-12-05 MED ORDER — ONDANSETRON HCL 4 MG/2ML IJ SOLN
INTRAMUSCULAR | Status: AC
  Filled 2017-12-05: qty 2

## 2017-12-05 MED ORDER — MEPERIDINE HCL 100 MG/ML IJ SOLN
INTRAMUSCULAR | Status: DC | PRN
  Administered 2017-12-05: 25 mg via INTRAVENOUS

## 2017-12-05 MED ORDER — MIDAZOLAM HCL 5 MG/5ML IJ SOLN
INTRAMUSCULAR | Status: DC | PRN
  Administered 2017-12-05: 1 mg via INTRAVENOUS
  Administered 2017-12-05 (×2): 2 mg via INTRAVENOUS
  Administered 2017-12-05: 1 mg via INTRAVENOUS

## 2017-12-05 MED ORDER — MIDAZOLAM HCL 5 MG/5ML IJ SOLN
INTRAMUSCULAR | Status: AC
  Filled 2017-12-05: qty 5

## 2017-12-05 MED ORDER — SODIUM CHLORIDE 0.9 % IV SOLN
INTRAVENOUS | Status: DC
  Administered 2017-12-05: 10:00:00 via INTRAVENOUS

## 2017-12-05 MED ORDER — HYDROCORTISONE 2.5 % RE CREA: 1.0000 "application " | TOPICAL_CREAM | Freq: Two times a day (BID) | RECTAL | 1 refills | Status: DC

## 2017-12-05 MED ORDER — MIDAZOLAM HCL 5 MG/5ML IJ SOLN
INTRAMUSCULAR | Status: AC
  Filled 2017-12-05: qty 10

## 2017-12-05 NOTE — Op Note (Signed)
[facility] Patient Name: Glen Guerrero Procedure Date: 12/05/2017 9:45 AM MRN: [id number] Date of Birth: Feb 22, 1946 Attending MD: Norvel Richards , MD CSN: [id number] Age: 72 Admit Type: Outpatient Procedure:                Colonoscopy Indications:              Hematochezia Providers:                Norvel Richards, MD, Jeanann Lewandowsky. Gwenlyn Perking RN, RN,                            Aram Candela Referring MD:              Medicines:                Midazolam 5 mg IV, Meperidine 25 mg IV, Ondansetron                            4 mg SL Complications:            No immediate complications. Estimated blood loss:                            Minimal. Estimated Blood Loss:     Estimated blood loss was minimal. Procedure:                Pre-Anesthesia Assessment:                           - Prior to the procedure, a History and Physical                            was performed, and patient medications and                            allergies were reviewed. The patient's tolerance of                            previous anesthesia was also reviewed. The risks                            and benefits of the procedure and the sedation                            options and risks were discussed with the patient.                            All questions were answered, and informed consent                            was obtained. Prior Anticoagulants: The patient has                            taken no previous anticoagulant or antiplatelet                            agents. ASA  Grade Assessment: II - A patient with                            mild systemic disease. After reviewing the risks                            and benefits, the patient was deemed in                            satisfactory condition to undergo the procedure.                           After obtaining informed consent, the colonoscope                            was passed under direct vision. Throughout the           procedure, the patient's blood pressure, pulse, and                            oxygen saturations were monitored continuously. The                            CF-HQ190L ([id number]) scope was introduced through                            the anus and advanced to the the cecum, identified                            by appendiceal orifice and ileocecal valve. The                            colonoscopy was performed without difficulty. The                            patient tolerated the procedure well. The quality                            of the bowel preparation was adequate. The                            ileocecal valve, appendiceal orifice, and rectum                            were photographed. The entire colon was well                            visualized. Scope In: 10:35:27 AM Scope Out: 10:52:08 AM Scope Withdrawal Time: 0 hours 11 minutes 12 seconds  Total Procedure Duration: 0 hours 16 minutes 41 seconds  Findings:      Hemorrhoids were found on perianal exam.      Non-bleeding external and internal hemorrhoids were found during       retroflexion. The hemorrhoids were moderate, medium-sized and Grade III       (internal hemorrhoids that  prolapse but require manual reduction).      A 6 mm polyp was found in the descending colon. The polyp was       semi-pedunculated. The polyp was removed with a cold snare. Resection       and retrieval were complete. Estimated blood loss was minimal.      Multiple small and large-mouthed diverticula were found in the sigmoid       colon and descending colon.      The exam was otherwise without abnormality on direct and retroflexion       views. Impression:               - Hemorrhoids found on perianal exam.                           - Non-bleeding external and internal hemorrhoids.                           - One 6 mm polyp in the descending colon, removed                            with a cold snare. Resected and retrieved.                            - Diverticulosis in the sigmoid colon and in the                            descending colon.                           - The examination was otherwise normal on direct                            and retroflexion views. Bleeding likely from                            hemorrhoids. Moderate Sedation:      Moderate (conscious) sedation was administered by the endoscopy nurse       and supervised by the endoscopist. The following parameters were       monitored: oxygen saturation, heart rate, blood pressure, respiratory       rate, EKG, adequacy of pulmonary ventilation, and response to care.       Total physician intraservice time was 23 minutes. Recommendation:           - Patient has a contact number available for                            emergencies. The signs and symptoms of potential                            delayed complications were discussed with the                            patient. Return to normal activities tomorrow.  Written discharge instructions were provided to the                            patient.                           - Advance diet as tolerated.                           - Patient has a contact number available for                            emergencies. The signs and symptoms of potential                            delayed complications were discussed with the                            patient. Return to normal activities tomorrow.                            Written discharge instructions were provided to the                            patient.                           - Advance diet as tolerated.                           - Continue present medications.                           - Repeat colonoscopy date to be determined after                            pending pathology results are reviewed for                            surveillance based on pathology results.                           - Return to GI office in 8  weeks. Pamphlet on                            hemorrhoid banding. Office visit as scheduled. Procedure Code(s):        --- Professional ---                           [phone], Colonoscopy, flexible; with removal of                            tumor(s), polyp(s), or other lesion(s) by snare                            technique  G0500, Moderate sedation services provided by the                            same physician or other qualified health care                            professional performing a gastrointestinal                            endoscopic service that sedation supports,                            requiring the presence of an independent trained                            observer to assist in the monitoring of the                            patient's level of consciousness and physiological                            status; initial 15 minutes of intra-service time;                            patient age 55 years or older (additional time may                            be reported with [phone], as appropriate)                           [phone], Moderate sedation services provided by the                            same physician or other qualified health care                            professional performing the diagnostic or                            therapeutic service that the sedation supports,                            requiring the presence of an independent trained                            observer to assist in the monitoring of the                            patient's level of consciousness and physiological                            status; each additional 15 minutes intraservice  time (List separately in addition to code for                            primary service) Diagnosis Code(s):        --- Professional ---                           K64.2, Third degree hemorrhoids                           D12.4, Benign  neoplasm of descending colon                           K92.1, Melena (includes Hematochezia)                           K57.30, Diverticulosis of large intestine without                            perforation or abscess without bleeding CPT copyright 2017 American Medical Association. All rights reserved. The codes documented in this report are preliminary and upon coder review may  be revised to meet current compliance requirements. Cristopher Estimable. Sheryle Vice, MD Norvel Richards, MD 12/05/2017 11:00:02 AM This report has been signed electronically. Number of Addenda: 0

## 2017-12-05 NOTE — Telephone Encounter (Signed)
[name], RN from Short Stay called to make Banding OV in 6-8 weeks. I asked if this was for a banding and she said yes. Banding was made with AB on 10/31. I looked at RMR recommendations and it said banding information was given to patient and to make OV in 8 weeks. Please clarify if patient needs OV or a banding and can I keep patient on AB schedule for 10/31 or if I need to move him farther out in [name]

## 2017-12-05 NOTE — Discharge Instructions (Signed)
Colonoscopy Discharge Instructions  Read the instructions outlined below and refer to this sheet in the next few weeks. These discharge instructions provide you with general information on caring for yourself after you leave the hospital. Your doctor may also give you specific instructions. While your treatment has been planned according to the most current medical practices available, unavoidable complications occasionally occur. If you have any problems or questions after discharge, call Dr. Gala Romney at [phone]. ACTIVITY  You may resume your regular activity, but move at a slower pace for the next 24 hours.   Take frequent rest periods for the next 24 hours.   Walking will help get rid of the air and reduce the bloated feeling in your belly (abdomen).   No driving for 24 hours (because of the medicine (anesthesia) used during the test).    Do not sign any important legal documents or operate any machinery for 24 hours (because of the anesthesia used during the test).  NUTRITION  Drink plenty of fluids.   You may resume your normal diet as instructed by your doctor.   Begin with a light meal and progress to your normal diet. Heavy or fried foods are harder to digest and may make you feel sick to your stomach (nauseated).   Avoid alcoholic beverages for 24 hours or as instructed.  MEDICATIONS  You may resume your normal medications unless your doctor tells you otherwise.  WHAT YOU CAN EXPECT TODAY  Some feelings of bloating in the abdomen.   Passage of more gas than usual.   Spotting of blood in your stool or on the toilet paper.  IF YOU HAD POLYPS REMOVED DURING THE COLONOSCOPY:  No aspirin products for 7 days or as instructed.   No alcohol for 7 days or as instructed.   Eat a soft diet for the next 24 hours.  FINDING OUT THE RESULTS OF YOUR TEST Not all test results are available during your visit. If your test results are not back during the visit, make an appointment  with your caregiver to find out the results. Do not assume everything is normal if you have not heard from your caregiver or the medical facility. It is important for you to follow up on all of your test results.  SEEK IMMEDIATE MEDICAL ATTENTION IF:  You have more than a spotting of blood in your stool.   Your belly is swollen (abdominal distention).   You are nauseated or vomiting.   You have a temperature over 101.   You have abdominal pain or discomfort that is severe or gets worse throughout the day.    Diverticulosis, colon polyp and  Hemorrhoid information provided  Pamphlet on hemorrhoid banding provided  Office visit with [place] in 6-8 weeks for Hemorrhoid banding.  Oct 31 @ 9:30 am  Further recommendations to follow  Pending review of pathology report  Diverticulosis Diverticulosis is a condition that develops when small pouches (diverticula) form in the wall of the large intestine (colon). The colon is where water is absorbed and stool is formed. The pouches form when the inside layer of the colon pushes through weak spots in the outer layers of the colon. You may have a few pouches or many of them. What are the causes? The cause of this condition is not known. What increases the risk? The following factors may make you more likely to develop this condition:  Being older than age 73. Your risk for this condition increases with age. Diverticulosis is rare  among people younger than age 20. By age 32, many people have it.  Eating a low-fiber diet.  Having frequent constipation.  Being overweight.  Not getting enough exercise.  Smoking.  Taking over-the-counter pain medicines, like aspirin and ibuprofen.  Having a family history of diverticulosis.  What are the signs or symptoms? In most people, there are no symptoms of this condition. If you do have symptoms, they may include:  Bloating.  Cramps in the abdomen.  Constipation or diarrhea.  Pain in the lower  left side of the abdomen.  How is this diagnosed? This condition is most often diagnosed during an exam for other colon problems. Because diverticulosis usually has no symptoms, it often cannot be diagnosed independently. This condition may be diagnosed by:  Using a flexible scope to examine the colon (colonoscopy).  Taking an X-ray of the colon after dye has been put into the colon (barium enema).  Doing a CT scan.  How is this treated? You may not need treatment for this condition if you have never developed an infection related to diverticulosis. If you have had an infection before, treatment may include:  Eating a high-fiber diet. This may include eating more fruits, vegetables, and grains.  Taking a fiber supplement.  Taking a live bacteria supplement (probiotic).  Taking medicine to relax your colon.  Taking antibiotic medicines.  Follow these instructions at home:  Drink 6-8 glasses of water or more each day to prevent constipation.  Try not to strain when you have a bowel movement.  If you have had an infection before: ? Eat more fiber as directed by your health care provider or your diet and nutrition specialist (dietitian). ? Take a fiber supplement or probiotic, if your health care provider approves.  Take over-the-counter and prescription medicines only as told by your health care provider.  If you were prescribed an antibiotic, take it as told by your health care provider. Do not stop taking the antibiotic even if you start to feel better.  Keep all follow-up visits as told by your health care provider. This is important. Contact a health care provider if:  You have pain in your abdomen.  You have bloating.  You have cramps.  You have not had a bowel movement in 3 days. Get help right away if:  Your pain gets worse.  Your bloating becomes very bad.  You have a fever or chills, and your symptoms suddenly get worse.  You vomit.  You have bowel  movements that are bloody or black.  You have bleeding from your rectum. Summary  Diverticulosis is a condition that develops when small pouches (diverticula) form in the wall of the large intestine (colon).  You may have a few pouches or many of them.  This condition is most often diagnosed during an exam for other colon problems.  If you have had an infection related to diverticulosis, treatment may include increasing the fiber in your diet, taking supplements, or taking medicines. This information is not intended to replace advice given to you by your health care provider. Make sure you discuss any questions you have with your health care provider. Document Released: 01/07/2004 Document Revised: 02/29/2016 Document Reviewed: 02/29/2016 Elsevier Interactive Patient Education  2017 [place].  Colon Polyps Polyps are tissue growths inside the body. Polyps can grow in many places, including the large intestine (colon). A polyp may be a round bump or a mushroom-shaped growth. You could have one polyp or several. Most colon polyps  are noncancerous (benign). However, some colon polyps can become cancerous over time. What are the causes? The exact cause of colon polyps is not known. What increases the risk? This condition is more likely to develop in people who:  Have a family history of colon cancer or colon polyps.  Are older than 25 or older than 45 if they are African American.  Have inflammatory bowel disease, such as ulcerative colitis or Crohn disease.  Are overweight.  Smoke cigarettes.  Do not get enough exercise.  Drink too much alcohol.  Eat a diet that is: ? High in fat and red meat. ? Low in fiber.  Had childhood cancer that was treated with abdominal radiation.  What are the signs or symptoms? Most polyps do not cause symptoms. If you have symptoms, they may include:  Blood coming from your rectum when having a bowel movement.  Blood in your stool.The  stool may look dark red or black.  A change in bowel habits, such as constipation or diarrhea.  How is this diagnosed? This condition is diagnosed with a colonoscopy. This is a procedure that uses a lighted, flexible scope to look at the inside of your colon. How is this treated? Treatment for this condition involves removing any polyps that are found. Those polyps will then be tested for cancer. If cancer is found, your health care provider will talk to you about options for colon cancer treatment. Follow these instructions at home: Diet  Eat plenty of fiber, such as fruits, vegetables, and whole grains.  Eat foods that are high in calcium and vitamin D, such as milk, cheese, yogurt, eggs, liver, fish, and broccoli.  Limit foods high in fat, red meats, and processed meats, such as hot dogs, sausage, bacon, and lunch meats.  Maintain a healthy weight, or lose weight if recommended by your health care provider. General instructions  Do not smoke cigarettes.  Do not drink alcohol excessively.  Keep all follow-up visits as told by your health care provider. This is important. This includes keeping regularly scheduled colonoscopies. Talk to your health care provider about when you need a colonoscopy.  Exercise every day or as told by your health care provider. Contact a health care provider if:  You have new or worsening bleeding during a bowel movement.  You have new or increased blood in your stool.  You have a change in bowel habits.  You unexpectedly lose weight. This information is not intended to replace advice given to you by your health care provider. Make sure you discuss any questions you have with your health care provider. Document Released: 01/06/2004 Document Revised: 09/17/2015 Document Reviewed: 03/02/2015 Elsevier Interactive Patient Education  [employer].

## 2017-12-05 NOTE — H&P (Signed)
Primary Care Physician:  Kendrick Ranch, MD Primary Gastroenterologist:  Dr. Gala Romney  Pre-Procedure History & Physical: HPI:  Glen Guerrero. is a 72 y.o. male here for diagnostic colonoscopy. Recent rectal bleeding and a history  Of colonic polyps.  Past Medical History:  Diagnosis Date  . Arthritis   . DM type 2 (diabetes mellitus, type 2) ([place])   . History of colon polyps    2015 polyp x 1 dysplastic and adenomatous pieces.  . Hypercholesterolemia   . Hypertension   . Insomnia   . Prostate enlargement   . Restless leg syndrome   . Seasonal allergies       Prior to Admission medications   Medication Sig Start Date End Date Taking? Authorizing Provider  alfuzosin (UROXATRAL) 10 MG 24 hr tablet Take 10 mg by mouth daily with breakfast.   Yes [provider]  amLODipine (NORVASC) 10 MG tablet Take 10 mg by mouth daily.   Yes [provider]  aspirin EC 81 MG tablet Take 81 mg by mouth daily.   Yes [provider]  atorvastatin (LIPITOR) 20 MG tablet Take 20 mg by mouth daily.   Yes [provider]  Black Pepper-Turmeric 3-500 MG CAPS Take 1 tablet by mouth 2 (two) times daily.   Yes [provider]  Cholecalciferol (VITAMIN D3) 2000 UNITS TABS Take 2,000 Units by mouth daily.   Yes [provider]  ezetimibe (ZETIA) 10 MG tablet Take 10 mg by mouth daily.   Yes [provider]  fluorouracil (EFUDEX) 5 % cream Apply 1 application topically 2 (two) times daily.   Yes [provider]  gabapentin (NEURONTIN) 600 MG tablet Take 1,200 mg by mouth at bedtime.    Yes [provider]  glipiZIDE (GLUCOTROL) 10 MG tablet Take 10 mg by mouth daily before breakfast.    Yes [provider]  ibuprofen (ADVIL,MOTRIN) 200 MG tablet Take 200-800 mg by mouth every 6 (six) hours as needed.   Yes [provider]  JARDIANCE 25 MG TABS tablet Take 25 mg by mouth daily. 08/19/17  Yes  [provider]  LANTUS SOLOSTAR 100 UNIT/ML Solostar Pen Inject 35 Units into the skin at bedtime.  10/05/17  Yes [provider]  loratadine (CLARITIN) 10 MG tablet Take 10 mg by mouth daily as needed for allergies.   Yes [provider]  losartan-hydrochlorothiazide (HYZAAR) 100-25 MG per tablet Take 0.5 tablets by mouth daily.    Yes [provider]  metoprolol tartrate (LOPRESSOR) 25 MG tablet Take 25 mg by mouth 2 (two) times daily.    Yes [provider]  Misc Natural Products (BLACK CHERRY CONCENTRATE PO) Take 1,000 mg by mouth 2 (two) times daily.    Yes [provider]  Na Sulfate-K Sulfate-Mg Sulf 17.5-3.13-1.6 GM/177ML SOLN Take 1 kit by mouth as directed. 10/24/17  Yes Castulo Scarpelli, Cristopher Estimable, MD  NON FORMULARY Uses CPAP nightly   Yes [provider]  Potassium 99 MG TABS Take 99 mg by mouth daily.   Yes [provider]  vitamin B-12 (CYANOCOBALAMIN) 1000 MCG tablet Take 1,000 mcg by mouth daily.   Yes [provider]  dicyclomine (BENTYL) 10 MG capsule Take 1 tab daily in the morning when waking. Patient not taking: Reported on 10/24/2017 05/19/14   Carlis Stable, NP  hyoscyamine (LEVSIN SL) 0.125 MG SL tablet Place 1 tablet (0.125 mg total) under the tongue daily. Patient not taking: Reported on  10/24/2017 10/20/14   Daneil Dolin, MD  hyoscyamine (LEVSIN/SL) 0.125 MG SL tablet Place 1 tablet (0.125 mg total) under the tongue every 4 (four) hours as needed. Patient not taking: Reported on 10/24/2017 08/25/14   Carlis Stable, NP    Allergies as of 10/24/2017 - Review Complete 10/24/2017  Allergen Reaction Noted  . Ciprofloxacin  05/19/2014  . Flagyl [metronidazole]  05/19/2014  . Tramadol Nausea And Vomiting 10/24/2017    Family History  Problem Relation Age of Onset  . Hypertension Mother   . Heart failure Mother   . Prostate cancer Father   . Hypertension Child   . Hypertension Child   . Hypertension Child    . High Cholesterol Child   . High Cholesterol Child   . High Cholesterol Child   . Colon cancer Neg Hx       Review of Systems: See HPI, otherwise negative ROS  Physical Exam: BP 140/77   Pulse 66   Temp 98.6 F (37 C) (Oral)   Resp 17   Ht 6' (1.829 m)   Wt 97.1 kg   SpO2 98%   BMI 29.02 kg/m  General:   Alert,  Well-developed, well-nourished, pleasant and cooperative in NAD Neck:  Supple; no masses or thyromegaly. No significant cervical adenopathy. Lungs:  Clear throughout to auscultation.   No wheezes, crackles, or rhonchi. No acute distress. Heart:  Regular rate and rhythm; no murmurs, clicks, rubs,  or gallops. Abdomen: Non-distended, normal bowel sounds.  Soft and nontender without appreciable mass or hepatosplenomegaly.  Pulses:  Normal pulses noted. Extremities:  Without clubbing or edema.  Impression/Plan:   72 year old gentleman history of colonic polyps and intermittent rectal bleediing. Here for colonoscopy to further  Evaluate per plan.  The risks, benefits, limitations, alternatives and imponderables have been reviewed with the patient. Questions have been answered. All parties are agreeable.  The risks, benefits, limitations, alternatives and imponderables have been reviewed with the patient. Questions have been answered. All parties are agreeable.      Notice: This dictation was prepared with Dragon dictation along with smaller phrase technology. Any transcriptional errors that result from this process are unintentional and may not be corrected upon review.

## 2017-12-05 NOTE — Telephone Encounter (Signed)
May keep on 10/31. In meantime, avoid straining, avoid constipation, start using Anusol cream BID per rectum. Call if any irritation or pain and we will see sooner. I sent Anusol cream to his pharmacy.

## 2017-12-08 ENCOUNTER — Encounter (HOSPITAL_COMMUNITY): Payer: Self-pay | Admitting: Internal Medicine

## 2017-12-09 ENCOUNTER — Encounter: Payer: Self-pay | Admitting: Internal Medicine

## 2018-02-22 ENCOUNTER — Ambulatory Visit (INDEPENDENT_AMBULATORY_CARE_PROVIDER_SITE_OTHER): Payer: Medicare Other | Admitting: Gastroenterology

## 2018-02-22 ENCOUNTER — Encounter: Payer: Self-pay | Admitting: Gastroenterology

## 2018-02-22 VITALS — BP 122/75 | HR 57 | Temp 97.7°F | Ht 72.0 in | Wt 216.8 lb

## 2018-02-22 DIAGNOSIS — K642 Third degree hemorrhoids: Secondary | ICD-10-CM | POA: Diagnosis not present

## 2018-02-22 NOTE — Progress Notes (Addendum)
[facility] Banding Note:  Pleasant 72 year old male s/p recent colonoscopy Aug 2019, known internal hemorrhoids with intermittent rectal bleeding, prolapsing but able to manually reduce at home, feels sensation of prolapse right side of rectum. Leakage and soilage noted.   The patient presents with symptomatic grade 3 hemorrhoids, unresponsive to maximal medical therapy, requesting rubber band ligation of his hemorrhoidal disease. All risks, benefits, and alternative forms of therapy were described and informed consent was obtained. Reports feeling more prolapsing on the right side when wiping.   In the left lateral decubitus position DRE exam was performed. External skin tags noted, no mass or pain with DRE.   The decision was made to band the right posterior internal hemorrhoid, and the [place] was used to perform band ligation without complication. Digital anorectal examination was then performed to assure proper positioning of the band, and to adjust the banded tissue as required. The patient was discharged home without pain or other issues. Dietary and behavioral recommendations were given along with follow-up instructions. The patient will return in 2-3 weeks for followup and possible additional banding as required.  No complications were encountered and the patient tolerated the procedure well.  Annitta Needs, PhD, ANP-BC [facility]

## 2018-02-22 NOTE — Patient Instructions (Signed)
Continue to avoid straining.  Benefiber 2 teaspoons twice daily  Limit toilet time to 2-3 minutes  Call with any interim problems  Schedule followup appointment in 2-3 weeks from now  It was good to meet you!  It was a pleasure to see you today. I strive to create trusting relationships with patients to provide genuine, compassionate, and quality care. I value your feedback. If you receive a survey regarding your visit,  I greatly appreciate you taking time to fill this out.   Annitta Needs, PhD, ANP-BC [facility]enter[facility]

## 2018-03-14 ENCOUNTER — Ambulatory Visit (INDEPENDENT_AMBULATORY_CARE_PROVIDER_SITE_OTHER): Payer: Medicare Other | Admitting: Gastroenterology

## 2018-03-14 ENCOUNTER — Encounter: Payer: Self-pay | Admitting: Gastroenterology

## 2018-03-14 VITALS — BP 111/70 | HR 62 | Temp 97.7°F | Ht 72.0 in | Wt 217.4 lb

## 2018-03-14 DIAGNOSIS — K641 Second degree hemorrhoids: Secondary | ICD-10-CM | POA: Diagnosis not present

## 2018-03-14 NOTE — Progress Notes (Signed)
[facility] Banding Note:   Pleasant 72 year old male s/p recent colonoscopy Aug 2019, known internal hemorrhoids with intermittent rectal bleeding, prolapsing but able to manually reduce at home. Previously banded the right posterior internal hemorrhoid. He notes mild urinary hesitancy several days following banding, and he saw his PCP. Started on new agent. History of BPH. No urinary issues now.   The patient presents with symptomatic grade 2-3 hemorrhoids, unresponsive to maximal medical therapy, requesting rubber band ligation of his/her hemorrhoidal disease. All risks, benefits, and alternative forms of therapy were described and informed consent was obtained.   The decision was made to band the right anterior internal hemorrhoid, and the [place] was used to perform band ligation without complication. Digital anorectal examination was then performed to assure proper positioning of the band, and to adjust the banded tissue as required. The patient was discharged home without pain or other issues. Dietary and behavioral recommendations were given along with follow-up instructions. The patient will return in 2-3 weeks for followup and possible additional banding as required.  No complications were encountered and the patient tolerated the procedure well. Discussed with patient to call if any further issues with urinary symptoms.    Annitta Needs, PhD, ANP-BC [facility]astroenter[facility]

## 2018-03-14 NOTE — Patient Instructions (Signed)
Avoid straining.  Benefiber 2 teaspoons twice daily  Limit toilet time to 2-3 minutes  Call with any interim problems!  Please schedule followup appointment in 2-3 weeks from now!  It was a pleasure to see you today. I strive to create trusting relationships with patients to provide genuine, compassionate, and quality care. I value your feedback. If you receive a survey regarding your visit,  I greatly appreciate you taking time to fill this out.   Annitta Needs, PhD, ANP-BC [facility]astroenterology

## 2018-04-06 ENCOUNTER — Ambulatory Visit (INDEPENDENT_AMBULATORY_CARE_PROVIDER_SITE_OTHER): Payer: Medicare Other | Admitting: Gastroenterology

## 2018-04-06 ENCOUNTER — Encounter: Payer: Self-pay | Admitting: Gastroenterology

## 2018-04-06 VITALS — BP 116/73 | HR 64 | Temp 97.9°F | Ht 72.0 in | Wt 217.4 lb

## 2018-04-06 DIAGNOSIS — K642 Third degree hemorrhoids: Secondary | ICD-10-CM

## 2018-04-06 NOTE — Patient Instructions (Signed)
Continue to avoid straining, and use Benefiber (or equivalent) 2 teaspoons twice a day. Limit toilet time to 2-3 minutes.   Please call with any other issues!  This was the third band, so you should notice improvement overall. If any issues continuing let me know.   We will see you back as needed!  I enjoyed seeing you again today! As you know, I value our relationship and want to provide genuine, compassionate, and quality care. I welcome your feedback. If you receive a survey regarding your visit,  I greatly appreciate you taking time to fill this out. See you next time!  Annitta Needs, PhD, ANP-BC [facility]astroenterology

## 2018-04-06 NOTE — Progress Notes (Addendum)
   [facility] Banding Note:   Pleasant 72 year old male s/p recent colonoscopy Aug 2019, known internal hemorrhoids with intermittent rectal bleeding, prolapsing but able to manually reduce at home. Here for follow-up for potential last banding. Felt to have Grade III hemorrhoids in the past. Previously banding right posterior and most recently right anterior. Here to discuss left lateral banding.  No rectal bleeding. Continues to note improvement from last 2 bandings. Still with prolapsing occasionally, easily reduced manually. The patient presents with symptomatic grade 3 hemorrhoids, requesting rubber band ligation of his hemorrhoidal disease. All risks, benefits, and alternative forms of therapy were described and informed consent was obtained.   The decision was made to band the left lateral internal hemorrhoid, and the [place] was used to perform band ligation without complication. Digital anorectal examination was then performed to assure proper positioning of the band, and to adjust the banded tissue as required. The patient was discharged home without pain or other issues. Dietary and behavioral recommendations were given along with follow-up instructions. The patient will follow-up prn. Next colonoscopy in 7 years.   No complications were encountered and the patient tolerated the procedure well. Discussed with patient to call if any further issues with urinary symptoms.    Annitta Needs, PhD, ANP-BC [facility] Gastroenterology

## 2018-12-07 ENCOUNTER — Ambulatory Visit (INDEPENDENT_AMBULATORY_CARE_PROVIDER_SITE_OTHER): Payer: Medicare Other | Admitting: Urology

## 2018-12-07 ENCOUNTER — Other Ambulatory Visit: Payer: Self-pay

## 2018-12-07 ENCOUNTER — Other Ambulatory Visit (HOSPITAL_COMMUNITY): Payer: Self-pay | Admitting: Urology

## 2018-12-07 ENCOUNTER — Other Ambulatory Visit: Payer: Self-pay | Admitting: Urology

## 2018-12-07 DIAGNOSIS — R3912 Poor urinary stream: Secondary | ICD-10-CM | POA: Diagnosis not present

## 2018-12-07 DIAGNOSIS — N403 Nodular prostate with lower urinary tract symptoms: Secondary | ICD-10-CM

## 2018-12-07 DIAGNOSIS — R972 Elevated prostate specific antigen [PSA]: Secondary | ICD-10-CM | POA: Diagnosis not present

## 2019-01-04 ENCOUNTER — Ambulatory Visit (HOSPITAL_COMMUNITY)
Admission: RE | Admit: 2019-01-04 | Discharge: 2019-01-04 | Disposition: A | Discharge status: Home / Self Care | Payer: Medicare Other | Source: Ambulatory Visit | Attending: Urology | Admitting: Urology

## 2019-01-04 ENCOUNTER — Encounter (HOSPITAL_COMMUNITY): Payer: Self-pay

## 2019-01-04 ENCOUNTER — Other Ambulatory Visit: Payer: Self-pay

## 2019-01-04 DIAGNOSIS — R972 Elevated prostate specific antigen [PSA]: Secondary | ICD-10-CM | POA: Diagnosis not present

## 2019-01-04 DIAGNOSIS — C61 Malignant neoplasm of prostate: Secondary | ICD-10-CM | POA: Insufficient documentation

## 2019-01-04 MED ORDER — CEFTRIAXONE SODIUM 1 G IJ SOLR
1.0000 g | Freq: Once | INTRAMUSCULAR | Status: AC
  Administered 2019-01-04: 13:00:00 1 g via INTRAMUSCULAR

## 2019-01-04 MED ORDER — LIDOCAINE HCL (PF) 2 % IJ SOLN
INTRAMUSCULAR | Status: AC
  Filled 2019-01-04: qty 10

## 2019-01-04 MED ORDER — LIDOCAINE HCL (PF) 1 % IJ SOLN
INTRAMUSCULAR | Status: AC
  Administered 2019-01-04: 2.1 mL
  Filled 2019-01-04: qty 5

## 2019-01-04 MED ORDER — CEFTRIAXONE SODIUM 1 G IJ SOLR
INTRAMUSCULAR | Status: AC
  Administered 2019-01-04: 1 g via INTRAMUSCULAR
  Filled 2019-01-04: qty 10

## 2019-01-16 ENCOUNTER — Other Ambulatory Visit: Payer: Self-pay | Admitting: Urology

## 2019-01-16 ENCOUNTER — Other Ambulatory Visit (HOSPITAL_COMMUNITY): Payer: Self-pay | Admitting: Urology

## 2019-01-16 DIAGNOSIS — C61 Malignant neoplasm of prostate: Secondary | ICD-10-CM

## 2019-01-21 ENCOUNTER — Other Ambulatory Visit (HOSPITAL_COMMUNITY): Payer: Self-pay | Admitting: Urology

## 2019-01-21 DIAGNOSIS — C61 Malignant neoplasm of prostate: Secondary | ICD-10-CM

## 2019-01-30 ENCOUNTER — Encounter (HOSPITAL_COMMUNITY)
Admission: RE | Admit: 2019-01-30 | Discharge: 2019-01-30 | Disposition: A | Discharge status: Home / Self Care | Payer: Medicare Other | Source: Ambulatory Visit | Attending: Urology | Admitting: Urology

## 2019-01-30 ENCOUNTER — Encounter (HOSPITAL_COMMUNITY): Payer: Self-pay

## 2019-01-30 ENCOUNTER — Other Ambulatory Visit: Payer: Self-pay

## 2019-01-30 ENCOUNTER — Ambulatory Visit (HOSPITAL_COMMUNITY)
Admission: RE | Admit: 2019-01-30 | Discharge: 2019-01-30 | Disposition: A | Discharge status: Home / Self Care | Payer: Medicare Other | Source: Ambulatory Visit | Attending: Urology | Admitting: Urology

## 2019-01-30 DIAGNOSIS — C61 Malignant neoplasm of prostate: Secondary | ICD-10-CM | POA: Diagnosis not present

## 2019-01-30 LAB — POCT I-STAT CREATININE: Creatinine, Ser: 1.3 mg/dL — ABNORMAL HIGH (ref 0.61–1.24)

## 2019-01-30 MED ORDER — TECHNETIUM TC 99M MEDRONATE IV KIT
20.0000 | PACK | Freq: Once | INTRAVENOUS | Status: AC | PRN
  Administered 2019-01-30: 21 via INTRAVENOUS

## 2019-01-30 MED ORDER — IOHEXOL 300 MG/ML  SOLN
100.0000 mL | Freq: Once | INTRAMUSCULAR | Status: AC | PRN
  Administered 2019-01-30: 08:00:00 100 mL via INTRAVENOUS

## 2019-02-01 ENCOUNTER — Ambulatory Visit (INDEPENDENT_AMBULATORY_CARE_PROVIDER_SITE_OTHER): Payer: Medicare Other | Admitting: Urology

## 2019-02-01 DIAGNOSIS — R911 Solitary pulmonary nodule: Secondary | ICD-10-CM

## 2019-02-01 DIAGNOSIS — N403 Nodular prostate with lower urinary tract symptoms: Secondary | ICD-10-CM | POA: Diagnosis not present

## 2019-02-01 DIAGNOSIS — C61 Malignant neoplasm of prostate: Secondary | ICD-10-CM

## 2019-02-01 DIAGNOSIS — R3912 Poor urinary stream: Secondary | ICD-10-CM | POA: Diagnosis not present

## 2019-02-04 ENCOUNTER — Encounter: Payer: Self-pay | Admitting: *Deleted

## 2019-02-05 ENCOUNTER — Telehealth: Payer: Self-pay | Admitting: Medical Oncology

## 2019-02-05 NOTE — Telephone Encounter (Signed)
I called pt to introduce myself as the Prostate Nurse Navigator and the Coordinator of the Prostate [place].  1. I confirmed with the patient he is aware of his referral to the clinic 02/12/19 arriving at 12:30 pm. I reviewed COVID protocols for [facility].   2. I discussed the format of the clinic and the physicians he will be seeing that day.  3. I discussed where the clinic is located and how to contact me.  4. I confirmed his address and informed him I would be mailing a packet of information and forms to be completed. I asked him to bring them with him the day of his appointment.   He voiced understanding of the above. I asked him to call me if he has any questions or concerns regarding his appointments or the forms he needs to complete.

## 2019-02-08 ENCOUNTER — Encounter: Payer: Self-pay | Admitting: Medical Oncology

## 2019-02-11 NOTE — Progress Notes (Signed)
GU Location of Tumor / Histology: prostatic adenocarcinoma  If Prostate Cancer, Gleason Score is (4 + 3) and PSA is (3.7) on finasteride. Prostate volume: 32 mL.   Prostate cancer runs in the family. Patient has a history of skin cancer.  Biopsies of prostate (if applicable) revealed:    Past/Anticipated interventions by urology, if any: prostate biopsy, CT abd/pelvis (negative), bone scan (negative), referral to [facility]  Patient taking finasteride and alfluzosin.  Past/Anticipated interventions by medical oncology, if any: no  Weight changes, if any: denies  Bowel/Bladder complaints, if any: IPSS 6. SHIM 24.    Nausea/Vomiting, if any: denies  Pain issues, if any:  Joint pain. Arthritis. Gout.  SAFETY ISSUES:  Prior radiation? no  Pacemaker/ICD? no  Possible current pregnancy? no  Is the patient on methotrexate? no  Current Complaints / other details:  73 year old male. Married with three children.

## 2019-02-12 ENCOUNTER — Inpatient Hospital Stay: Payer: Medicare Other | Attending: Oncology | Admitting: Oncology

## 2019-02-12 ENCOUNTER — Ambulatory Visit
Admission: RE | Admit: 2019-02-12 | Discharge: 2019-02-12 | Disposition: A | Discharge status: Home / Self Care | Payer: Medicare Other | Source: Ambulatory Visit | Attending: Radiation Oncology | Admitting: Radiation Oncology

## 2019-02-12 ENCOUNTER — Telehealth: Payer: Self-pay | Admitting: Medical Oncology

## 2019-02-12 ENCOUNTER — Encounter: Payer: Self-pay | Admitting: Medical Oncology

## 2019-02-12 ENCOUNTER — Encounter: Payer: Self-pay | Admitting: Radiation Oncology

## 2019-02-12 ENCOUNTER — Other Ambulatory Visit: Payer: Self-pay

## 2019-02-12 VITALS — BP 151/74 | HR 67 | Temp 98.5°F | Resp 18 | Ht 72.0 in | Wt 208.4 lb

## 2019-02-12 DIAGNOSIS — C61 Malignant neoplasm of prostate: Secondary | ICD-10-CM | POA: Insufficient documentation

## 2019-02-12 DIAGNOSIS — I10 Essential (primary) hypertension: Secondary | ICD-10-CM

## 2019-02-12 DIAGNOSIS — Z79899 Other long term (current) drug therapy: Secondary | ICD-10-CM | POA: Diagnosis not present

## 2019-02-12 DIAGNOSIS — E119 Type 2 diabetes mellitus without complications: Secondary | ICD-10-CM | POA: Insufficient documentation

## 2019-02-12 HISTORY — DX: Unspecified malignant neoplasm of skin, unspecified: C44.90

## 2019-02-12 HISTORY — DX: Carpal tunnel syndrome, unspecified upper limb: G56.00

## 2019-02-12 HISTORY — DX: Diverticulitis of intestine, part unspecified, without perforation or abscess without bleeding: K57.92

## 2019-02-12 HISTORY — DX: Malignant neoplasm of prostate: C61

## 2019-02-12 HISTORY — DX: Gout, unspecified: M10.9

## 2019-02-12 NOTE — Consult Note (Signed)
[facility]     02/12/2019   --------------------------------------------------------------------------------   Glen Guerrero  [id number]  DOB: [birth year], 73 year old Male  SSN:    PRIMARY CARE:  Venugopal K. Vasireddy, MD  REFERRING:    PROVIDER:  Irine Seal, M.D.  TREATING:  Raynelle Bring, M.D.  LOCATION:  [facility] [phone]     --------------------------------------------------------------------------------   CC/HPI: CC: Prostate Cancer   Physician requesting consult: Dr. Irine Seal  PCP: Dr. Alford Highland  Location of consult: [facility]   Glen Guerrero is a 73 year old gentleman seen by his primary care physician initially in October 2019 for BPH and LUTS. He was started on medical therapy with alfuzosin and finasteride. His PSA at that time was 3.7. He was seen by Dr. Jeffie Pollock in August because is voiding symptoms had not improved on combination medical therapy. At that time, he was noted to have a new right base nodule measuring about 8 mm and organ confined. His PSA at that time was 1.6 after having started 5 alpha reductase inhibitor therapy. He underwent a TRUS biopsy of the prostate on 01/04/19 that demonstrated Gleason 4+3=7 adenocarcinoma with 9 out of 12 biopsy cores positive for malignancy. Interestingly, his lower urinary tract symptoms significantly improved after Dr. Jeffie Pollock performed his digital rectal exam.   Family history: He does have a family history of prostate cancer. His father died of metastatic prostate cancer at age 71.   Imaging studies:  CT abd/pelvis (01/30/19): Small right lower lobe pulmonary subpleural nodules. No evidence of metastatic disease.  Bone scan (01/30/19): Negative for metastatic disease.   PMH: He has a history of hypertension, hyperlipidemia, diabetes, arthritis, GERD and sleep apnea.  PSH: Appendectomy, hernia repair.   TNM stage: cT2  N0 M0 (induration along right base and lateral mid gland with concern of induration headed toward the right seminal vesicle but no clear extraprostatic disease)  PSA: 1.6 (on finasteride)  Gleason score: 4+3=7 (Grade group 3)  Biopsy (01/04/19): 9/12 cores positive  Left: L lateral apex (40%, 3+3=6), L apex (< 5%, 3+3=6), L base (5%, 3+3=6)  Right: R apex (40%, 3+4=7), R lateral apex (50%, 3+4=7, PNI), R mid (80%, 3+4=7), R lateral mid (95%, 4+3=7, PNI), R base (50%, 3+4=7), R lateral base (90%, 4+3=7)  Prostate volume: 32.0 cc   Nomogram  OC disease: 30%  EPE: 67%  SVI: 18%  LNI: 18%  PFS (5 year, 10 year): 64%, 49%   Urinary function: IPSS is 6.  Erectile function: SHIM score is 24. He does complain of rare episodes of painful ejaculation and loss of ejaculation which have occurred since starting alpha-blocker therapy.     ALLERGIES: Mirapex TABS Tramadol     Notes: reports cant take cipro and flaygl together   MEDICATIONS: Aspirin  Finasteride  Metoprolol Tartrate  Alfuzosin Hcl Er  Allegra Allergy  Amlodipine Besylate  Atorvastatin Calcium  Ezetimibe  Fenofibrate  Fluorouracil  Fluticasone Propionate  Glipizide  Jardiance  Levemir  Ropinirole Hcl  Tart [place]     GU PSH: Prostate Needle Biopsy - 01/04/2019       PSH Notes: trigger finger  meniscus    NON-GU PSH: Appendectomy (open) Carpal tunnel surgery Hernia Repair Surgical Pathology, Gross And Microscopic Examination For Prostate Needle - 01/04/2019 Tonsillectomy..     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 His prostate volume  was 79ml. His LUTS have improved since our initial visit. He remains on finasteride and alfuzosin. he has not had a PSA since 10/19. I am going to get a PSA and testosterone today. I discussed options for therapy but feel his best options are prostatectomy or radiation therapy with ADT for 6-8 months. He has a fairly  high risk for T3 disease and at 50 witih comorbidities I would lean toward the radiation option but he would like to investigate the surgical option as well. I am going to get him set up for the [facility] for further discussion and I gave him the prostate cancer books to review. - 02/01/2019 Elevated PSA - 12/07/2018 Prostate nodule w/ LUTS, He has and elevated PSA with a right base nodule and LUTS. I am going to get him set up for biopsy and then have him return for a flowrate and possible cystoscopy. I have reviewed the risks of the biopsy including bleeding, infection and voiding difficulty and sent a script for Levaquin. - 12/07/2018 Weak Urinary Stream - 12/07/2018      PMH Notes: skin cancer   NON-GU PMH: Solitary pulmonary nodule, He had small subpleural nodules and will need f/u for that. I would consider a Chest CT at 6 months as the appropriate option but will await the recommendations of the [place]. - 02/01/2019 Arthritis GERD Hypercholesterolemia Hypertension Sleep Apnea Type 1 diabetes mellitus without complications    FAMILY HISTORY: Prostate Cancer - Runs in Family Tuberculosis - Runs in Family   SOCIAL HISTORY: No Social History    REVIEW OF SYSTEMS:    GU Review Male:   Patient denies frequent urination, hard to postpone urination, burning/ pain with urination, get up at night to urinate, leakage of urine, stream starts and stops, trouble starting your streams, and have to strain to urinate .  Gastrointestinal (Lower):   Patient denies diarrhea and constipation.  Gastrointestinal (Upper):   Patient denies nausea and vomiting.  Constitutional:   Patient denies fever, night sweats, weight loss, and fatigue.  Skin:   Patient denies itching and skin rash/ lesion.  Eyes:   Patient denies blurred vision and double vision.  Ears/ Nose/ Throat:   Patient denies sore throat and sinus problems.  Hematologic/Lymphatic:   Patient denies swollen glands and easy bruising.  Cardiovascular:    Patient denies leg swelling and chest pains.  Respiratory:   Patient denies cough and shortness of breath.  Endocrine:   Patient denies excessive thirst.  Musculoskeletal:   Patient denies back pain and joint pain.  Neurological:   Patient denies headaches and dizziness.  Psychologic:   Patient denies depression and anxiety.   VITAL SIGNS: None   GU PHYSICAL EXAMINATION:    Prostate: Prostate about 40 grams. He does have induration along the right lateral mid prostate gland extending toward the majority of the right base of the prostate with possible extension toward the right seminal vesicle.   MULTI-SYSTEM PHYSICAL EXAMINATION:    Constitutional: Well-nourished. No physical deformities. Normally developed. Good grooming.     PAST DATA REVIEWED:  Source Of History:  Patient  Lab Test Review:   PSA  Records Review:   Pathology Reports, Previous Patient Records  X-Ray Review: C.T. Abdomen/Pelvis: Reviewed Films.  Bone Scan: Reviewed Films.     02/01/19 02/08/18  PSA  Total PSA 1.6 ng/dl 3.7 ng/dl    02/01/19  Hormones  Testosterone, Total 237 pg/dL    PROCEDURES: None   ASSESSMENT:  ICD-10 Details  1 GU:   Prostate Cancer - C61    PLAN:           Document Letter(s):  Created for Patient: Clinical Summary         Notes:   1. Unfavorable intermediate risk prostate cancer: I had a detailed discussion with Mr. Kruchten today along with his wife and daughter who were part of the conversation. He has also seen Dr. Alen Blew and Dr. Tammi Klippel. Considering his life expectancy and disease parameters, I did recommend therapy of curative intent. The patient was counseled about the natural history of prostate cancer and the standard treatment options that are available for prostate cancer. It was explained to him how his age and life expectancy, clinical stage, Gleason score, and PSA affect his prognosis, the decision to proceed with additional staging studies, as well as how that  information influences recommended treatment strategies. We discussed the roles for active surveillance, radiation therapy, surgical therapy, androgen deprivation, as well as ablative therapy options for the treatment of prostate cancer as appropriate to his individual cancer situation. We discussed the risks and benefits of these options with regard to their impact on cancer control and also in terms of potential adverse events, complications, and impact on quality of life particularly related to urinary and sexual function. The patient was encouraged to ask questions throughout the discussion today and all questions were answered to his stated satisfaction. In addition, the patient was provided with and/or directed to appropriate resources and literature for further education about prostate cancer and treatment options.   We discussed options of primary surgical therapy vs. radiation therapy would likely at least short-term androgen deprivation. Considering his exam, I would be hesitant to consider a radiation seed implant alone in do think he would likely benefit from external beam radiation as at least a component of his therapy along with a short course of systemic androgen deprivation. He is going to further consider his options but appears to be leaning toward 6 months of androgen deprivation and external beam radiation therapy. He may be interested in proceeding with radiation therapy in [place], [place].   2. BPH/LUTS: Interestingly, his symptoms significantly improved after his rectal exam after not improving on combination medical therapy for 9 months. This does raise the question of how helpful as medication is. He has been having significant side effects with alpha-blocker therapy we discussed stopping this medication to see how he would do without it. Once he proceeds with prostate cancer treatment, it is also questionable whether finasteride will continue to provide him any additional benefit.  He will discuss this further with Dr. Jeffie Pollock once he has made a final decision about treatment.   Cc: Dr. Irine Seal  Dr. Tyler Pita  Dr. Zola Button  Dr. Horald Chestnut Vasireddy     E & M CODE: I spent at least 38 minutes face to face with the patient, more than 50% of that time was spent on counseling and/or coordinating care.

## 2019-02-12 NOTE — Progress Notes (Signed)
Radiation Oncology         [phone] ________________________________  Multidisciplinary Prostate Cancer Clinic  Initial Radiation Oncology Consultation  Name: Glen Guerrero. MRN: [id number]  Date: 02/12/2019  DOB: 1946/03/31  GY:[id number], Lanetta Inch, MD  Irine Seal, MD   REFERRING PHYSICIAN: Irine Seal, MD  DIAGNOSIS: 73 y.o. 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    ICD-10-CM   1. Malignant neoplasm of prostate ([place])  C61     HISTORY OF PRESENT ILLNESS::Glen Guerrero. is a 73 y.o. gentleman.  He presented with worsening voiding symptoms. He had been followed by his PCP, Dr. Silverio Decamp in [place], for this and is currently on finasteride and alfuzosin. PSA in 01/2018, after starting finasteride, was 3.7.  He made an appointment for evaluation in urology by Dr. Jeffie Pollock on 12/07/2018,  digital rectal examination was performed at that time revealing an 8 mm right base prostate nodule.  The patient proceeded to transrectal ultrasound with 12 biopsies of the prostate on 01/04/2019.  The prostate volume measured 32 cc.  Out of 12 core biopsies, 9 were positive.  The maximum Gleason score was 4+3, and this was seen in right base lateral and right mid lateral (with perineural invasion). Gleason 3+4 was seen in right apex lateral (with PNI), right apex, right mid, and right base. Gleason 3+3 was noted in left apex lateral, left base (with PNI), and left apex (small focus).    He underwent abdomen/pelvis CT on 01/30/2019, which showed no acute process or evidence of metastatic disease. Bone scan performed the same day also showed no evidence of metastatic disease.  The patient reviewed the biopsy results with his urologist and he has kindly been referred today to the multidisciplinary prostate cancer clinic for presentation of pathology and radiology studies in our conference for discussion of  potential radiation treatment options and clinical evaluation.    PREVIOUS RADIATION THERAPY: No  PAST MEDICAL HISTORY:  has a past medical history of Arthritis, Cancer ([place]), DM type 2 (diabetes mellitus, type 2) ([place]), History of colon polyps, Hypercholesterolemia, Hypertension, Insomnia, Prostate enlargement, Restless leg syndrome, and Seasonal allergies.    PAST SURGICAL HISTORY: Past Surgical History:  Procedure Laterality Date  . APPENDECTOMY    . COLONOSCOPY  2015   Dr.Spainhour- polypectomy x 1 pieces dysplastic and adenomatous; Recc repeat in 2020 (5 years).  . COLONOSCOPY N/A 12/05/2017   Procedure: COLONOSCOPY;  Surgeon: Daneil Dolin, MD;  Location: [facility];  Service: Endoscopy;  Laterality: N/A;  11:15am  . HERNIA REPAIR     inguinal x2  . POLYPECTOMY  12/05/2017   Procedure: POLYPECTOMY;  Surgeon: Daneil Dolin, MD;  Location: [facility];  Service: Endoscopy;;  colon   . TONSILLECTOMY      FAMILY HISTORY: family history includes Heart failure in his mother; High Cholesterol in his child, child, and child; Hypertension in his child, child, child, and mother; Prostate cancer in his father.  SOCIAL HISTORY:  reports that he quit smoking about 39 years ago. He quit smokeless tobacco use about 39 years ago. He reports current alcohol use. He reports that he does not use drugs.  ALLERGIES: Ciprofloxacin, Flagyl [metronidazole], and Tramadol  MEDICATIONS:  Current Outpatient Medications  Medication Sig Dispense Refill  . alfuzosin (UROXATRAL) 10 MG 24 hr tablet Take 10 mg by mouth daily with breakfast.    . amLODipine (  NORVASC) 10 MG tablet Take 10 mg by mouth daily.    Marland Kitchen aspirin EC 81 MG tablet Take 81 mg by mouth daily.    Marland Kitchen atorvastatin (LIPITOR) 20 MG tablet Take 20 mg by mouth daily.    . Black Pepper-Turmeric 3-500 MG CAPS Take 1 tablet by mouth 2 (two) times daily.    . Cholecalciferol (VITAMIN D3) 2000 UNITS TABS Take 2,000 Units by mouth daily.    Marland Kitchen  ezetimibe (ZETIA) 10 MG tablet Take 10 mg by mouth daily.    . finasteride (PROSCAR) 5 MG tablet Take 5 mg by mouth daily.    . fluorouracil (EFUDEX) 5 % cream Apply 1 application topically as needed.     . gabapentin (NEURONTIN) 600 MG tablet Take 1,200 mg by mouth at bedtime.     Marland Kitchen glipiZIDE (GLUCOTROL) 10 MG tablet Take 10 mg by mouth daily before breakfast.     . hydrocortisone (ANUSOL-HC) 2.5 % rectal cream Place 1 application rectally 2 (two) times daily. (Patient taking differently: Place 1 application rectally as needed. ) 30 g 1  . ibuprofen (ADVIL,MOTRIN) 200 MG tablet Take 200-800 mg by mouth every 6 (six) hours as needed.    Marland Kitchen JARDIANCE 25 MG TABS tablet Take 25 mg by mouth daily.  1  . LANTUS SOLOSTAR 100 UNIT/ML Solostar Pen Inject 36 Units into the skin at bedtime.   3  . loratadine (CLARITIN) 10 MG tablet Take 10 mg by mouth daily as needed for allergies.    Marland Kitchen losartan-hydrochlorothiazide (HYZAAR) 100-25 MG per tablet Take 0.5 tablets by mouth daily.     . metoprolol tartrate (LOPRESSOR) 25 MG tablet Take 25 mg by mouth 2 (two) times daily.     . Misc Natural Products (BLACK CHERRY CONCENTRATE PO) Take 1,000 mg by mouth 2 (two) times daily.     . Na Sulfate-K Sulfate-Mg Sulf 17.5-3.13-1.6 GM/177ML SOLN Take 1 kit by mouth as directed. (Patient not taking: Reported on 04/06/2018) 1 Bottle 0  . NON FORMULARY Uses CPAP nightly    . POTASSIUM PO Take 595 mg by mouth daily.     . vitamin B-12 (CYANOCOBALAMIN) 1000 MCG tablet Take 1,000 mcg by mouth daily.    . Wheat Dextrin (BENEFIBER PO) Take by mouth every other day.      No current facility-administered medications for this encounter.     REVIEW OF SYSTEMS:  On review of systems, the patient reports that he is doing well overall. He denies any chest pain, shortness of breath, cough, fevers, chills, night sweats, unintended weight changes. He denies any bowel disturbances, and denies abdominal pain, nausea or vomiting. He denies  any new musculoskeletal or joint aches or pains. He does report chronic joint pain, arthritis, and gout. His IPSS was 6, indicating mild urinary symptoms. His SHIM was 24, indicating he does not have erectile dysfunction. A complete review of systems is obtained and is otherwise negative.   PHYSICAL EXAM:  Wt Readings from Last 3 Encounters:  04/06/18 217 lb 6.4 oz (98.6 kg)  03/14/18 217 lb 6.4 oz (98.6 kg)  02/22/18 216 lb 12.8 oz (98.3 kg)   Temp Readings from Last 3 Encounters:  01/04/19 97.9 F (36.6 C) (Oral)  04/06/18 97.9 F (36.6 C) (Oral)  03/14/18 97.7 F (36.5 C) (Oral)   BP Readings from Last 3 Encounters:  01/04/19 (!) 152/76  04/06/18 116/73  03/14/18 111/70   Pulse Readings from Last 3 Encounters:  01/04/19 64  04/06/18 64  03/14/18 62    /10  In general this is a well appearing gentleman in no acute distress. He is alert and oriented x4 and appropriate throughout the examination.   KPS = 100  100 - Normal; no complaints; no evidence of disease. 90   - Able to carry on normal activity; minor signs or symptoms of disease. 80   - Normal activity with effort; some signs or symptoms of disease. 2   - Cares for self; unable to carry on normal activity or to do active work. 60   - Requires occasional assistance, but is able to care for most of his personal needs. 50   - Requires considerable assistance and frequent medical care. 17   - Disabled; requires special care and assistance. 85   - Severely disabled; hospital admission is indicated although death not imminent. 64   - Very sick; hospital admission necessary; active supportive treatment necessary. 10   - Moribund; fatal processes progressing rapidly. 0     - Dead  Karnofsky DA, Abelmann WH, Craver LS and Burchenal JH [phone]) The use of the nitrogen mustards in the palliative treatment of carcinoma: with particular reference to bronchogenic carcinoma Cancer 1 634-56   LABORATORY DATA:  No results found  for: WBC, HGB, HCT, MCV, PLT No results found for: NA, K, CL, CO2 No results found for: ALT, AST, GGT, ALKPHOS, BILITOT   RADIOGRAPHY: Nm Bone Scan Whole Body  Result Date: 01/30/2019 CLINICAL DATA:  New diagnosis prostate cancer, no bone pain EXAM: NUCLEAR MEDICINE WHOLE BODY BONE SCAN TECHNIQUE: Whole body anterior and posterior images were obtained approximately 3 hours after intravenous injection of radiopharmaceutical. RADIOPHARMACEUTICALS:  21 mCi Technetium-31mMDP IV COMPARISON:  None Correlation: CT abdomen pelvis 01/30/2019 FINDINGS: Minimal uptake of tracer at the shoulders, sternoclavicular joints, LEFT elbow, LEFT wrist, knees, and feet, typically degenerative. No worrisome sites of abnormal tracer accumulation to suggest osseous metastatic disease. Expected urinary tract and soft tissue distribution of tracer. IMPRESSION: No scintigraphic evidence of osseous metastatic disease. Electronically Signed   By: MLavonia DanaM.D.   On: 01/30/2019 16:33   Ct Abdomen Pelvis W Contrast  Result Date: 01/30/2019 CLINICAL DATA:  Staging of prostate cancer. History of hernia repair and appendectomy. EXAM: CT ABDOMEN AND PELVIS WITH CONTRAST TECHNIQUE: Multidetector CT imaging of the abdomen and pelvis was performed using the standard protocol following bolus administration of intravenous contrast. CONTRAST:  1041mOMNIPAQUE IOHEXOL 300 MG/ML  SOLN COMPARISON:  None. FINDINGS: Lower chest: Subpleural right lower lobe pulmonary nodules on the order of 3 mm on series 4. Normal heart size without pericardial or pleural effusion. Hepatobiliary: Hepatomegaly at 21.6 cm. Normal gallbladder, without biliary ductal dilatation. Pancreas: Normal, without mass or ductal dilatation. Spleen: Normal in size, without focal abnormality. Adrenals/Urinary Tract: Normal adrenal glands. Mild bilateral renal cortical thinning. Fluid density bilateral renal lesions are consistent with cysts. No hydronephrosis. Normal urinary  bladder. Stomach/Bowel: Normal stomach, without wall thickening. Extensive colonic diverticulosis. Normal terminal ileum. Appendectomy. Normal small bowel. Vascular/Lymphatic: Aortic atherosclerosis. No abdominopelvic adenopathy. Reproductive: Normal prostate. Symmetric seminal vesicles. Other: No significant free fluid. Left inguinal hernia repair. 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 IMPRESSION: 1. No acute process or evidence of metastatic disease in the abdomen or pelvis. 2. Hepatomegaly. 3. Aortic Atherosclerosis (ICD10-I70.0). 4. Tiny right base pulmonary nodules are favored to represent subpleural lymph nodes. Recommend attention on follow-up. Electronically Signed   By: KyAbigail Miyamoto.D.   On: 01/30/2019 08:18  IMPRESSION/PLAN: 73 y.o. gentleman with Stage T2a vs. T3 adenocarcinoma of the prostate with a PSA of 3.7 on finasteride and a Gleason score of 4+3.    We discussed the patient's workup and outlined the nature of prostate cancer in this setting. The patient's T stage, Gleason's score, and PSA put him into the unfavorable intermediate risk group. Accordingly, he is eligible for a variety of potential treatment options including brachytherapy, 8 weeks of external radiation or 5 weeks of external radiation followed by a brachytherapy boost. We discussed the available radiation techniques, and focused on the details and logistics and delivery. We discussed and outlined the risks, benefits, short and long-term effects associated with radiotherapy and compared and contrasted these with prostatectomy. We discussed the role of SpaceOAR in reducing the rectal toxicity associated with radiotherapy. We also detailed the role of ADT in the treatment of prostate cancer and outlined the associated side effects that could be expected with this therapy.  At the end of the conversation the patient is leaning toward external radiotherapy closer to home in [place], [place].  He expressed a  desire to avoid ADT now reserving it for salvage if needed later.Marland Kitchen    ------------------------------------------------   Tyler Pita, MD [facility] Director and Director of Stereotactic Radiosurgery Direct Dial: [phone]  Fax: [phone] [email or web address]  Skype  [email or web address]   This document serves as a record of services personally performed by Tyler Pita, MD. It was created on his behalf by Wilburn Mylar, a trained medical scribe. The creation of this record is based on the scribe's personal observations and the provider's statements to them. This document has been checked and approved by the attending provider.

## 2019-02-12 NOTE — Progress Notes (Signed)
Reason for the request:    Prostate cancer  HPI: I was asked by Dr. Jeffie Pollock to evaluate Glen Guerrero for evaluation of prostate cancer.  He is a 73 year old man with history of hypertension and diabetes who was complaining of lower urinary tract symptoms including nocturia, frequency and urgency and found to have an elevated PSA of 3.7.  He was evaluated by Dr. Jeffie Pollock and was started on finasteride which helped his symptoms.  A repeat digital rectal examination showed a concerning right base nodule of the prostate that prompted a biopsy after that.  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  His PSA did go down expectedly on finasteride.  CT scan and bone scan for staging purposes demonstrate any evidence of advanced disease.  Clinically, he reports improvement in his urinary symptoms at this time.  He denies any abdominal pain or discomfort.  Continues to be active and attends activities of daily living.  He does not report any headaches, blurry vision, syncope or seizures. Does not report any fevers, chills or sweats.  Does not report any cough, wheezing or hemoptysis.  Does not report any chest pain, palpitation, orthopnea or leg edema.  Does not report any nausea, vomiting or abdominal pain.  Does not report any constipation or diarrhea.  Does not report any skeletal complaints.    Does not report frequency, urgency or hematuria.  Does not report any skin rashes or lesions. Does not report any heat or cold intolerance.  Does not report any lymphadenopathy or petechiae.  Does not report any anxiety or depression.  Remaining review of systems is negative.    Past Medical History:  Diagnosis Date  . Arthritis   . Cancer [facility])    Prostate  . DM type 2 (diabetes mellitus, type 2) ([place])   . History of colon polyps    2015 polyp x 1 dysplastic and adenomatous pieces.  . Hypercholesterolemia   . Hypertension    . Insomnia   . Prostate enlargement   . Restless leg syndrome   . Seasonal allergies   :  Past Surgical History:  Procedure Laterality Date  . APPENDECTOMY    . COLONOSCOPY  2015   Dr.Spainhour- polypectomy x 1 pieces dysplastic and adenomatous; Recc repeat in 2020 (5 years).  . COLONOSCOPY N/A 12/05/2017   Procedure: COLONOSCOPY;  Surgeon: Daneil Dolin, MD;  Location: [facility];  Service: Endoscopy;  Laterality: N/A;  11:15am  . HERNIA REPAIR     inguinal x2  . POLYPECTOMY  12/05/2017   Procedure: POLYPECTOMY;  Surgeon: Daneil Dolin, MD;  Location: [facility];  Service: Endoscopy;;  colon   . TONSILLECTOMY    :   Current Outpatient Medications:  .  alfuzosin (UROXATRAL) 10 MG 24 hr tablet, Take 10 mg by mouth daily with breakfast., Disp: , Rfl:  .  amLODipine (NORVASC) 10 MG tablet, Take 10 mg by mouth daily., Disp: , Rfl:  .  aspirin EC 81 MG tablet, Take 81 mg by mouth daily., Disp: , Rfl:  .  atorvastatin (LIPITOR) 20 MG tablet, Take 20 mg by mouth daily., Disp: , Rfl:  .  Black Pepper-Turmeric 3-500 MG CAPS, Take 1 tablet by mouth 2 (two) times daily., Disp: , Rfl:  .  Cholecalciferol (VITAMIN D3) 2000 UNITS TABS, Take 2,000 Units by mouth daily., Disp: , Rfl:  .  ezetimibe (ZETIA) 10 MG tablet, Take 10 mg by mouth daily., Disp: , Rfl:  .  finasteride (PROSCAR) 5 MG tablet, Take 5 mg by mouth daily., Disp: , Rfl:  .  fluorouracil (EFUDEX) 5 % cream, Apply 1 application topically as needed. , Disp: , Rfl:  .  gabapentin (NEURONTIN) 600 MG tablet, Take 1,200 mg by mouth at bedtime. , Disp: , Rfl:  .  glipiZIDE (GLUCOTROL) 10 MG tablet, Take 10 mg by mouth daily before breakfast. , Disp: , Rfl:  .  hydrocortisone (ANUSOL-HC) 2.5 % rectal cream, Place 1 application rectally 2 (two) times daily. (Patient taking differently: Place 1 application rectally as needed. ), Disp: 30 g, Rfl: 1 .  ibuprofen (ADVIL,MOTRIN) 200 MG tablet, Take 200-800 mg by mouth every 6 (six)  hours as needed., Disp: , Rfl:  .  JARDIANCE 25 MG TABS tablet, Take 25 mg by mouth daily., Disp: , Rfl: 1 .  LANTUS SOLOSTAR 100 UNIT/ML Solostar Pen, Inject 36 Units into the skin at bedtime. , Disp: , Rfl: 3 .  loratadine (CLARITIN) 10 MG tablet, Take 10 mg by mouth daily as needed for allergies., Disp: , Rfl:  .  losartan-hydrochlorothiazide (HYZAAR) 100-25 MG per tablet, Take 0.5 tablets by mouth daily. , Disp: , Rfl:  .  metoprolol tartrate (LOPRESSOR) 25 MG tablet, Take 25 mg by mouth 2 (two) times daily. , Disp: , Rfl:  .  Misc Natural Products (BLACK CHERRY CONCENTRATE PO), Take 1,000 mg by mouth 2 (two) times daily. , Disp: , Rfl:  .  Na Sulfate-K Sulfate-Mg Sulf 17.5-3.13-1.6 GM/177ML SOLN, Take 1 kit by mouth as directed. (Patient not taking: Reported on 04/06/2018), Disp: 1 Bottle, Rfl: 0 .  NON FORMULARY, Uses CPAP nightly, Disp: , Rfl:  .  POTASSIUM PO, Take 595 mg by mouth daily. , Disp: , Rfl:  .  vitamin B-12 (CYANOCOBALAMIN) 1000 MCG tablet, Take 1,000 mcg by mouth daily., Disp: , Rfl:  .  Wheat Dextrin (BENEFIBER PO), Take by mouth every other day. , Disp: , Rfl: :  Allergies  Allergen Reactions  . Ciprofloxacin Nausea Only    Lightheaded  . Flagyl [Metronidazole] Other (See Comments)    Can't remember symptoms  . Tramadol Nausea And Vomiting    Can't take any medication similar to Tramadol  :  Family History  Problem Relation Age of Onset  . Hypertension Mother   . Heart failure Mother   . Prostate cancer Father   . Hypertension Child   . Hypertension Child   . Hypertension Child   . High Cholesterol Child   . High Cholesterol Child   . High Cholesterol Child   . Colon cancer Neg Hx   :  Social History   Socioeconomic History  . Marital status: Married    Spouse name: Not on file  . Number of children: Not on file  . Years of education: Not on file  . Highest education level: Not on file  Occupational History  . Not on file  Social Needs  .  Financial resource strain: Not on file  . Food insecurity    Worry: Not on file    Inability: Not on file  . Transportation needs    Medical: Not on file    Non-medical: Not on file  Tobacco Use  . Smoking status: Former Smoker    Quit date: 05/20/1979    Years since quitting: 39.7  . Smokeless tobacco: Former Systems developer    Quit date:  05/20/1979  Substance and Sexual Activity  . Alcohol use: Yes    Alcohol/week: 0.0 standard drinks    Comment: wine once a week; occ whiskey  . Drug use: No  . Sexual activity: Not on file  Lifestyle  . Physical activity    Days per week: Not on file    Minutes per session: Not on file  . Stress: Not on file  Relationships  . Social Herbalist on phone: Not on file    Gets together: Not on file    Attends religious service: Not on file    Active member of club or organization: Not on file    Attends meetings of clubs or organizations: Not on file    Relationship status: Not on file  . Intimate partner violence    Fear of current or ex partner: Not on file    Emotionally abused: Not on file    Physically abused: Not on file    Forced sexual activity: Not on file  Other Topics Concern  . Not on file  Social History Narrative  . Not on file  :  Pertinent items are noted in HPI.  Exam: ECOG 1 General appearance: alert and cooperative appeared without distress. Head: atraumatic without any abnormalities. Eyes: conjunctivae/corneas clear. PERRL.  Sclera anicteric. Throat: lips, mucosa, and tongue normal; without oral thrush or ulcers. Resp: clear to auscultation bilaterally without rhonchi, wheezes or dullness to percussion. Cardio: regular rate and rhythm, S1, S2 normal, no murmur, click, rub or gallop GI: soft, non-tender; bowel sounds normal; no masses,  no organomegaly Skin: Skin color, texture, turgor normal. No rashes or lesions Lymph nodes: Cervical, supraclavicular, and axillary nodes normal. Neurologic: Grossly normal without  any motor, sensory or deep tendon reflexes. Musculoskeletal: No joint deformity or effusion.     Nm Bone Scan Whole Body  Result Date: 01/30/2019 CLINICAL DATA:  New diagnosis prostate cancer, no bone pain EXAM: NUCLEAR MEDICINE WHOLE BODY BONE SCAN TECHNIQUE: Whole body anterior and posterior images were obtained approximately 3 hours after intravenous injection of radiopharmaceutical. RADIOPHARMACEUTICALS:  21 mCi Technetium-27mMDP IV COMPARISON:  None Correlation: CT abdomen pelvis 01/30/2019 FINDINGS: Minimal uptake of tracer at the shoulders, sternoclavicular joints, LEFT elbow, LEFT wrist, knees, and feet, typically degenerative. No worrisome sites of abnormal tracer accumulation to suggest osseous metastatic disease. Expected urinary tract and soft tissue distribution of tracer. IMPRESSION: No scintigraphic evidence of osseous metastatic disease. Electronically Signed   By: MLavonia DanaM.D.   On: 01/30/2019 16:33   Ct Abdomen Pelvis W Contrast  Result Date: 01/30/2019 CLINICAL DATA:  Staging of prostate cancer. History of hernia repair and appendectomy. EXAM: CT ABDOMEN AND PELVIS WITH CONTRAST TECHNIQUE: Multidetector CT imaging of the abdomen and pelvis was performed using the standard protocol following bolus administration of intravenous contrast. CONTRAST:  1024mOMNIPAQUE IOHEXOL 300 MG/ML  SOLN COMPARISON:  None. FINDINGS: Lower chest: Subpleural right lower lobe pulmonary nodules on the order of 3 mm on series 4. Normal heart size without pericardial or pleural effusion. Hepatobiliary: Hepatomegaly at 21.6 cm. Normal gallbladder, without biliary ductal dilatation. Pancreas: Normal, without mass or ductal dilatation. Spleen: Normal in size, without focal abnormality. Adrenals/Urinary Tract: Normal adrenal glands. Mild bilateral renal cortical thinning. Fluid density bilateral renal lesions are consistent with cysts. No hydronephrosis. Normal urinary bladder. Stomach/Bowel: Normal stomach,  without wall thickening. Extensive colonic diverticulosis. Normal terminal ileum. Appendectomy. Normal small bowel. Vascular/Lymphatic: Aortic atherosclerosis. No abdominopelvic adenopathy. Reproductive: Normal prostate.  Symmetric seminal vesicles. Other: No significant free fluid. Left inguinal hernia repair. 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 IMPRESSION: 1. No acute process or evidence of metastatic disease in the abdomen or pelvis. 2. Hepatomegaly. 3. Aortic Atherosclerosis (ICD10-I70.0). 4. Tiny right base pulmonary nodules are favored to represent subpleural lymph nodes. Recommend attention on follow-up. Electronically Signed   By: Abigail Miyamoto M.D.   On: 01/30/2019 08:18    Assessment and Plan:   73 year old man with prostate cancer diagnosed in September 2020.  He was found to have a PSA initially of 3.7 that was decreased on finasteride of 1.6.  He had a abnormal right base nodule that showed a prostate cancer on a biopsy on 01/04/2019 with a Gleason score of 3+4 = 7 and 9 out of 12 cores.  Staging work-up did not show any evidence of metastatic disease.  This case was discussed today in the prostate cancer multidisciplinary clinic.  Imaging studies were discussed with the reviewing radiologist.  His pathology reports was also discussed with the reviewing pathologist.  Natural course of this disease was also reviewed in addition to treatment options.  His services would include primary surgical therapy although he would be at increased risk of postoperative complications given his age and alternatively definitive therapy with radiation would be a reasonable choice as well.  External beam radiation with 6 months of androgen deprivation would be equivalent at this time and achieving cure moving forward.  Complication associated with androgen deprivation was reviewed today include weight gain, hot flashes and sexual dysfunction.  Additional systemic therapy such as  chemotherapy, immunotherapy, androgen synthesis inhibitors among others are not really indicated in this particular setting.  These will have a role if he develops advanced disease in the future and likely will not be curative.  After discussion today, he will consider this treatment options and appears to be leaning towards radiation with short-term androgen deprivation therapy.  I think it is a reasonable option if he chooses to do so.  30  minutes was spent with the patient face-to-face today.  More than 50% of time was spent on reviewing his disease status, treatment options and answering questions regarding future plan of care.   Thank you for the referral. A copy of this consult has been forwarded to the requesting physician.

## 2019-02-12 NOTE — Progress Notes (Signed)
                               Care Plan Summary  Name:  Mr. Glen Guerrero DOB: Jan 31, 1946  Your Medical Team:   Urologist -  Dr. Raynelle Bring, [facility]  Radiation Oncologist - Dr. Tyler Pita, [facility]   Medical Oncologist - Dr. Zola Button, [place]  Recommendations: 1) 6 month ADT (hormone injection) with radiation 2) Prostatectomy   * These recommendations are based on information available as of today's consult.      Recommendations may change depending on the results of further tests or exams.   Next Steps: 1) Dr. Ralene Muskrat office will schedule hormone injection 2) Dr. Tammi Klippel will refer you to [facility] for radiation   When appointments need to be scheduled, you will be contacted by [facility] and/or [facility].  Questions?  Please do not hesitate to call Cira Rue, RN, BSN, OCN at [phone] any questions or concerns.  Shirlean Mylar is your Oncology Nurse Navigator and is available to assist you while you're receiving your medical care at [facility].

## 2019-02-12 NOTE — Telephone Encounter (Signed)
Spoke with patient to confirm appointment for [facility] today, arriving at 12:30 pm. I reviewed COVID restrictions and reviewed the registration process. I reminded him to bring his completed medical forms. He voiced understanding.

## 2019-02-13 ENCOUNTER — Telehealth: Payer: Self-pay | Admitting: Oncology

## 2019-02-13 NOTE — Telephone Encounter (Signed)
No los per 10/20

## 2019-02-14 ENCOUNTER — Encounter: Payer: Self-pay | Admitting: General Practice

## 2019-02-14 ENCOUNTER — Encounter: Payer: Self-pay | Admitting: Medical Oncology

## 2019-02-14 DIAGNOSIS — C61 Malignant neoplasm of prostate: Secondary | ICD-10-CM

## 2019-02-14 NOTE — Progress Notes (Signed)
Patient called stating he has discussed his treatment options with his family and he has decided to move forward with ST-ADT and radiation. I will inform Dr. Alinda Money and Dr. Tammi Klippel of his decision. He is aware that Dr. Ralene Muskrat office will schedule the ADT and [facility]  I asked him to call me if he has questions or concerns. He voiced understanding.

## 2019-02-14 NOTE — Progress Notes (Signed)
[place] Psychosocial Distress Screening Spiritual Care  LVM for [facility] following Prostate Multidisciplinary Clinic to introduce [place] team/resources, review distress screen per protocol, and assess for psychosocial/other needs.  The patient scored a 3 on the Psychosocial Distress Thermometer which indicates mild distress.   ONCBCN DISTRESS SCREENING 02/14/2019  Screening Type Initial Screening  Distress experienced in past week (1-10) 3  Emotional problem type Adjusting to illness  Spiritual/Religous concerns type Relating to God  Physical Problem type Sleep/insomnia;Tingling hands/feet  Referral to support programs Yes    Follow up needed: No. LVM encouraging callback. Please also page if needs arise or circumstances change. Thank you!   [place], [place], [facility] Pager [phone] Voicemail [phone]

## 2019-02-14 NOTE — Progress Notes (Signed)
[place] Spiritual Care Note  Connected with Glen Guerrero by phone. He is "very impressed with the whole team" and feeling confident in his care. He plans to join Prostate Cancer Support Group as a source of shared information and encouragement. Per pt, no other needs at this time, but he is very pleased to know of [place] team/programming availability should he wish to reach out later.   [place], [place], [facility] Pager [phone] Voicemail [phone]

## 2019-02-19 ENCOUNTER — Telehealth: Payer: Self-pay | Admitting: Medical Oncology

## 2019-02-19 NOTE — Telephone Encounter (Signed)
Patient returned call to confirm appointment 11/30 for ADT with Dr. Jeffie Pollock. He has a consult appointment next week with [facility], radiation oncology. I wished him well and asked him to call me if I can assist him in any way. He voiced his appreciation and understanding of the above.

## 2019-02-19 NOTE — Telephone Encounter (Signed)
Left a message with patient requesting a return call to follow up [place]. He is scheduled for ADT 10/30 with Dr. Jeffie Pollock and need to confirm [facility] has contacted him for consult.

## 2019-02-22 ENCOUNTER — Other Ambulatory Visit: Payer: Self-pay

## 2019-02-22 ENCOUNTER — Ambulatory Visit (INDEPENDENT_AMBULATORY_CARE_PROVIDER_SITE_OTHER): Payer: Medicare Other | Admitting: Urology

## 2019-02-22 DIAGNOSIS — E349 Endocrine disorder, unspecified: Secondary | ICD-10-CM

## 2019-02-22 DIAGNOSIS — N403 Nodular prostate with lower urinary tract symptoms: Secondary | ICD-10-CM | POA: Diagnosis not present

## 2019-02-22 DIAGNOSIS — C61 Malignant neoplasm of prostate: Secondary | ICD-10-CM | POA: Diagnosis not present

## 2019-03-01 ENCOUNTER — Encounter: Payer: Self-pay | Admitting: Medical Oncology

## 2019-03-25 ENCOUNTER — Encounter: Payer: Self-pay | Admitting: *Deleted

## 2019-03-26 ENCOUNTER — Ambulatory Visit (INDEPENDENT_AMBULATORY_CARE_PROVIDER_SITE_OTHER): Payer: Medicare Other | Admitting: Urology

## 2019-03-26 DIAGNOSIS — C61 Malignant neoplasm of prostate: Secondary | ICD-10-CM

## 2019-05-31 ENCOUNTER — Ambulatory Visit (INDEPENDENT_AMBULATORY_CARE_PROVIDER_SITE_OTHER): Payer: Medicare Other | Admitting: Urology

## 2019-05-31 ENCOUNTER — Other Ambulatory Visit: Payer: Self-pay

## 2019-05-31 ENCOUNTER — Encounter: Payer: Self-pay | Admitting: Urology

## 2019-05-31 VITALS — BP 122/70 | HR 69 | Temp 97.5°F | Ht 72.0 in | Wt 208.0 lb

## 2019-05-31 DIAGNOSIS — C61 Malignant neoplasm of prostate: Secondary | ICD-10-CM | POA: Diagnosis not present

## 2019-05-31 DIAGNOSIS — R918 Other nonspecific abnormal finding of lung field: Secondary | ICD-10-CM | POA: Diagnosis not present

## 2019-05-31 DIAGNOSIS — R351 Nocturia: Secondary | ICD-10-CM | POA: Diagnosis not present

## 2019-05-31 DIAGNOSIS — N1831 Chronic kidney disease, stage 3a: Secondary | ICD-10-CM

## 2019-05-31 LAB — POCT URINALYSIS DIPSTICK
Bilirubin, UA: NEGATIVE
Blood, UA: NEGATIVE
Glucose, UA: POSITIVE — AB
Ketones, UA: NEGATIVE
Leukocytes, UA: NEGATIVE
Nitrite, UA: NEGATIVE
Protein, UA: NEGATIVE
Spec Grav, UA: 1.025 (ref 1.010–1.025)
Urobilinogen, UA: NEGATIVE E.U./dL — AB
pH, UA: 5 (ref 5.0–8.0)

## 2019-05-31 LAB — BLADDER SCAN: Scan Result: 122.9

## 2019-05-31 MED ORDER — TAMSULOSIN HCL 0.4 MG PO CAPS: 0.8000 mg | ORAL_CAPSULE | Freq: Every day | ORAL | 11 refills | Status: DC

## 2019-05-31 NOTE — Progress Notes (Signed)
Subjective:  1. Prostate cancer ([place])   2. Pulmonary nodules   3. Nocturia   4. Stage 3a chronic kidney disease     I have prostate cancer. HPI: Glen Guerrero is a 74 year-old male established patient who is here evaluation for treatment of prostate cancer.  His prostate cancer was diagnosed 01/04/2019. He does have the pathology report from his biopsy. His PSA at his time of diagnosis was 3.7.   05/31/19: [name] returns today in f/u for his prostate cancer.  He was started on Firmagon 240mg  on 02/22/19 and he had Eligard 30mg  SQ on 03/26/19 and will need a second dose in early April.  He had some scrotal ecchymosis after SpaceOAR and gold seed placement on 03/19/19.   He has 7 more radiation treatments to go and has increased LUTS with an IPSS of 18.  He remains on alfuzosin and finasteride but still has significant symptoms with nocturia x 5 with difficulty voiding at night.  He has pain with voiding and it can take a long time to initiate voiding.   He does better if he walks around for a bit before voiding.   He is taking Azo with some benefit.   He has leg cramps about 5am.  His recent Cr is 1.42 on 05/08/19.  He would like to be considered for a nephrology consultation.    He has been seen at the [facility] for his T2a Gleason 7(4+3) prostate cancer. He has elected to proceed with EXRT with adjuvanct ADT for 6-8 months. He will be getting his EXRT at [facility] and I will await their input regarding the need for SpaceOAR and fiducials. He was initially seen for BPH with BOO and was found to have an 39mm right base nodule with a PSA of 3.7 which was from 10/19 prior to the initiation of finasteride which he is currently taking. He had a biopsy that demonstrated a 20ml prostate with a T2a N0 M0 Gleason 7(4+3) prostate cancer. The right base and mid lateral cores had Gleason 7(4+3) up to 90% involvement. The remaining 4 right cores had 7(3+4) disease up to 85% involvement. 3/6 left cores had low volume  Gleason 6 disease. The bone scan is negative and a CT showed some small subpleural nodules favored to be nodes but no other evidence of metastatic disease. His comorbidities include HTN, hyperlipidemia, and diabetes. His voiding symptoms have improved since his initial visit.   MSKCC nomogram predicts. 28% OCD, 69% ECE, 19% LNI and 20% SVI.    ROS:  ROS:  A complete review of systems was performed.  All systems are negative except for pertinent findings as noted.   Review of Systems  Constitutional: Positive for malaise/fatigue.  Gastrointestinal:       Frequent stools without diarrhea  Musculoskeletal: Positive for joint pain.    Allergies  Allergen Reactions  . Mirapex  [Pramipexole Dihydrochloride] Other (See Comments)  . Ciprofloxacin Nausea Only    Lightheaded  . Flagyl [Metronidazole] Other (See Comments)    Can't remember symptoms  . Ibuprofen   . Tramadol Nausea And Vomiting    Can't take any medication similar to Tramadol    Outpatient Encounter Medications as of 05/31/2019  Medication Sig  . amLODipine (NORVASC) 10 MG tablet Take 10 mg by mouth daily.  Marland Kitchen aspirin EC 81 MG tablet Take 81 mg by mouth daily.  Marland Kitchen atorvastatin (LIPITOR) 20 MG tablet Take 20 mg by mouth daily.  . calcium carbonate (OSCAL) 1500 (  600 Ca) MG TABS tablet Take by mouth 2 (two) times daily with a meal.  . ezetimibe (ZETIA) 10 MG tablet Take 10 mg by mouth daily.  . fenofibrate micronized (LOFIBRA) 134 MG capsule Take by mouth daily.  . fexofenadine (ALLEGRA) 180 MG tablet Take 180 mg by mouth daily.  . finasteride (PROSCAR) 5 MG tablet Take 5 mg by mouth daily.  . fluorouracil (EFUDEX) 5 % cream Apply 1 application topically as needed.   . fluticasone (FLONASE) 50 MCG/ACT nasal spray Place into both nostrils daily.  Marland Kitchen glipiZIDE (GLUCOTROL) 10 MG tablet Take 10 mg by mouth daily before breakfast.   . glucose blood test strip OneTouch Ultra Blue Test Strip  USE 1 STRIP VIA METER THREE TIMES A DAY  DX: E11.9  . hydrocortisone (ANUSOL-HC) 2.5 % rectal cream Place 1 application rectally 2 (two) times daily.  . Insulin Pen Needle 31G X 5 MM MISC BD Ultra-Fine Mini Pen Needle 31 gauge x 3/16"  . JARDIANCE 25 MG TABS tablet Take 25 mg by mouth daily.  Marland Kitchen LANTUS SOLOSTAR 100 UNIT/ML Solostar Pen Inject 36 Units into the skin at bedtime.   Marland Kitchen LEVEMIR FLEXTOUCH 100 UNIT/ML Pen INJECT 40 UNITS SUBCUTANEOUSLY AT BEDTIME  . loratadine (CLARITIN) 10 MG tablet Take 10 mg by mouth daily as needed for allergies.  . magnesium 30 MG tablet Take 30 mg by mouth 2 (two) times daily.  . Melatonin 10 MG TABS Take by mouth.  . metoprolol tartrate (LOPRESSOR) 25 MG tablet Take 25 mg by mouth 2 (two) times daily.   . Misc Natural Products (BLACK CHERRY CONCENTRATE PO) Take 1,000 mg by mouth 2 (two) times daily.   . NON FORMULARY Uses CPAP nightly  . ondansetron (ZOFRAN) 4 MG tablet Take 4 mg by mouth every 8 (eight) hours as needed.  Glory Rosebush Delica Lancets 99991111 MISC OneTouch Delica Lancets 30 gauge  USE TO TEST 3 TIMES A DAY  . potassium chloride (KLOR-CON) 8 MEQ tablet Take 8 mEq by mouth daily.  Marland Kitchen rOPINIRole (REQUIP) 0.5 MG tablet TAKE 1 TABLET BY MOUTH EVERYDAY AT BEDTIME  . [DISCONTINUED] alfuzosin (UROXATRAL) 10 MG 24 hr tablet Take 10 mg by mouth daily with breakfast.  . ibuprofen (ADVIL,MOTRIN) 200 MG tablet Take 200-800 mg by mouth every 6 (six) hours as needed.  . tamsulosin (FLOMAX) 0.4 MG CAPS capsule Take 2 capsules (0.8 mg total) by mouth daily.   No facility-administered encounter medications on file as of 05/31/2019.    Past Medical History:  Diagnosis Date  . Arthritis   . Carpal tunnel syndrome   . Diverticulitis   . DM type 2 (diabetes mellitus, type 2) ([place])   . Gout   . History of colon polyps    2015 polyp x 1 dysplastic and adenomatous pieces.  . Hypercholesterolemia   . Hypertension   . Insomnia   . Prostate cancer ([place])   . Prostate enlargement   . Restless leg syndrome   .  Seasonal allergies   . Skin cancer     Past Surgical History:  Procedure Laterality Date  . APPENDECTOMY    . COLONOSCOPY  2015   Dr.Spainhour- polypectomy x 1 pieces dysplastic and adenomatous; Recc repeat in 2020 (5 years).  . COLONOSCOPY N/A 12/05/2017   Procedure: COLONOSCOPY;  Surgeon: Daneil Dolin, MD;  [facility];  Service: Endoscopy;  Laterality: N/A;  11:15am  . HERNIA REPAIR     inguinal x2  . POLYPECTOMY  12/05/2017   Procedure: POLYPECTOMY;  Surgeon: Daneil Dolin, MD;  Location: [facility];  Service: Endoscopy;;  colon   . TONSILLECTOMY      Social History   Socioeconomic History  . Marital status: Married    Spouse name: Katharine Look  . Number of children: 3  . Years of education: Not on file  . Highest education level: Not on file  Occupational History  . Not on file  Tobacco Use  . Smoking status: Current Some Day Smoker    Types: Cigars  . Smokeless tobacco: Former Systems developer    Quit date: 05/20/1979  . Tobacco comment: 5 cigars per week  Substance and Sexual Activity  . Alcohol use: Yes    Alcohol/week: 0.0 standard drinks    Comment: wine once a week; occ whiskey  . Drug use: No  . Sexual activity: Yes  Other Topics Concern  . 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 Difficulty of Paying Living Expenses: Not on file  Food Insecurity:   . Worried About Charity fundraiser in the Last Year: Not on file  . Ran Out of Food in the Last Year: Not on file  Transportation Needs:   . Lack of Transportation (Medical): Not on file  . Lack of Transportation (Non-Medical): Not on file  Physical Activity:   . Days of Exercise per Week: Not on file  . Minutes of Exercise per Session: Not on file  Stress:   . Feeling of Stress : Not on file  Social Connections:   . Frequency of  Communication with Friends and Family: Not on file  . Frequency of Social Gatherings with Friends and Family: Not on file  . Attends Religious Services: Not on file  . Active Member of Clubs or Organizations: Not on file  . Attends Archivist Meetings: Not on file  . Marital Status: Not on file  Intimate Partner Violence:   . Fear of Current or Ex-Partner: Not on file  . Emotionally Abused: Not on file  . Physically Abused: Not on file  . Sexually Abused: Not on file    Family History  Problem Relation Age of Onset  . Hypertension Mother   . Heart failure Mother   . Prostate cancer Father   . Hypertension Child   . Hypertension Child   . Hypertension Child   . High Cholesterol Child   . High Cholesterol Child   . High Cholesterol Child   . Cancer Paternal Aunt        unknown type  . Cancer Paternal Aunt        unknown type  . Colon cancer Neg Hx        Objective: Vitals:   05/31/19 1009  BP: 122/70  Pulse: 69  Temp: (!) 97.5 F (36.4 C)     Physical Exam  Lab Results:  Results for orders placed or performed in visit on 05/31/19 (from the past 24 hour(s))  POCT urinalysis dipstick     Status: Abnormal   Collection Time: 05/31/19 10:20 AM  Result Value Ref Range   Color, UA orange    Clarity, UA clear    Glucose, UA Positive (A) Negative   Bilirubin, UA neg    Ketones, UA neg    Spec  Grav, UA 1.025 1.010 - 1.025   Blood, UA neg    pH, UA 5.0 5.0 - 8.0   Protein, UA Negative Negative   Urobilinogen, UA negative (A) 0.2 or 1.0 E.U./dL   Nitrite, UA neg    Leukocytes, UA Negative Negative   Appearance clear    Odor       BMET No results for input(s): NA, K, CL, CO2, GLUCOSE, BUN, CREATININE, CALCIUM in the last 72 hours. PSA No results found for: PSA No results found for: TESTOSTERONE    Studies/Results: No results found.    Assessment & Plan: BOO with Nocturia associated with radiation therapy and a PVR of 166ml.  I am going  change him to tamsulosin bid from the alfuzosin.  He can stop the finasteride.     Prostate cancer.  He will return in 2 months with labs for Eligard 30mg .  Pulmonary nodules.  He will have a Chest CT prior to f/u.  CKD3.  He has asked for a nephrology referral and I will request that.    Meds ordered this encounter  Medications  . tamsulosin (FLOMAX) 0.4 MG CAPS capsule    Sig: Take 2 capsules (0.8 mg total) by mouth daily.    Dispense:  60 capsule    Refill:  11     Orders Placed This Encounter  Procedures  . CT CHEST WO CONTRAST    Standing Status:   Future    Standing Expiration Date:   07/28/2020    Order Specific Question:   Preferred imaging location?    Answer:   [facility]    Order Specific Question:   Radiology Contrast Protocol - do NOT remove file path    Answer:   [email or web address]  . PSA    Standing Status:   Future    Standing Expiration Date:   09/28/2019  . Testosterone    Standing Status:   Future    Standing Expiration Date:   08/28/2019  . Ambulatory referral to Nephrology    Referral Priority:   Routine    Referral Type:   Consultation    Referral Reason:   Specialty Services Required    Referred to Provider:   Edrick Oh, MD    Requested Specialty:   Nephrology    Number of Visits Requested:   1  . Bladder scan  . POCT urinalysis dipstick      Return in about 2 months (around 07/29/2019) for For Eligard 30mg  IM and labs.   CC: VasireddyLanetta Inch, MD      Irine Seal 05/31/2019

## 2019-06-19 ENCOUNTER — Other Ambulatory Visit: Payer: Self-pay | Admitting: Nephrology

## 2019-06-19 ENCOUNTER — Other Ambulatory Visit (HOSPITAL_COMMUNITY): Payer: Self-pay | Admitting: Nephrology

## 2019-06-19 DIAGNOSIS — E1122 Type 2 diabetes mellitus with diabetic chronic kidney disease: Secondary | ICD-10-CM

## 2019-06-19 DIAGNOSIS — E876 Hypokalemia: Secondary | ICD-10-CM

## 2019-06-19 DIAGNOSIS — I129 Hypertensive chronic kidney disease with stage 1 through stage 4 chronic kidney disease, or unspecified chronic kidney disease: Secondary | ICD-10-CM

## 2019-06-19 DIAGNOSIS — N181 Chronic kidney disease, stage 1: Secondary | ICD-10-CM

## 2019-06-19 DIAGNOSIS — Z79899 Other long term (current) drug therapy: Secondary | ICD-10-CM

## 2019-06-28 ENCOUNTER — Ambulatory Visit (HOSPITAL_COMMUNITY)
Admission: RE | Admit: 2019-06-28 | Discharge: 2019-06-28 | Disposition: A | Discharge status: Home / Self Care | Payer: Medicare Other | Source: Ambulatory Visit | Attending: Nephrology | Admitting: Nephrology

## 2019-06-28 ENCOUNTER — Other Ambulatory Visit: Payer: Self-pay

## 2019-06-28 DIAGNOSIS — R918 Other nonspecific abnormal finding of lung field: Secondary | ICD-10-CM | POA: Diagnosis present

## 2019-06-28 DIAGNOSIS — N181 Chronic kidney disease, stage 1: Secondary | ICD-10-CM | POA: Diagnosis present

## 2019-06-28 DIAGNOSIS — E1122 Type 2 diabetes mellitus with diabetic chronic kidney disease: Secondary | ICD-10-CM | POA: Diagnosis not present

## 2019-06-28 DIAGNOSIS — E876 Hypokalemia: Secondary | ICD-10-CM | POA: Diagnosis present

## 2019-06-28 DIAGNOSIS — Z79899 Other long term (current) drug therapy: Secondary | ICD-10-CM | POA: Diagnosis present

## 2019-06-28 DIAGNOSIS — I129 Hypertensive chronic kidney disease with stage 1 through stage 4 chronic kidney disease, or unspecified chronic kidney disease: Secondary | ICD-10-CM | POA: Diagnosis present

## 2019-07-16 ENCOUNTER — Other Ambulatory Visit: Payer: Self-pay | Admitting: Urology

## 2019-07-17 LAB — PSA: PSA: <0.1 ng/mL (ref <=4.0)

## 2019-07-17 LAB — TESTOSTERONE: Testosterone: <10 ng/dL — ABNORMAL LOW (ref 250–827)

## 2019-08-08 NOTE — Progress Notes (Signed)
Subjective:  1. Prostate cancer ([place])   2. BPH with urinary obstruction   3. Nocturia     I have prostate cancer. HPI: Glen Guerrero is a 74 year-old male established patient who is here evaluation for treatment of prostate cancer.  His prostate cancer was diagnosed 01/04/2019. He does have the pathology report from his biopsy. His PSA at his time of diagnosis was 3.7.   05/31/19: Glen Guerrero returns today in f/u for his prostate cancer.  He was started on Firmagon 240mg  on 02/22/19 and he had Eligard 30mg  SQ on 03/26/19 and is to have second and final injection today.  He had some scrotal ecchymosis after SpaceOAR and gold seed placement on 03/19/19.   He completed radiation in mid February.  He remains on tamsulosin.   His nocturia has improved and is down to 2-3x.    He has no further  pain with voiding or hesitancy.   He does better if he walks around for a bit before voiding.   His PSA is <0.1 and his T is <10/  His Cr was 1.42 on 05/08/19.  He has seen nephrology and has CKD3 and was started on Vit D.    He has been seen at [facility] for his T2a Gleason 7(4+3) prostate cancer. He has elected to proceed with EXRT with adjuvanct ADT for 6-8 months. He will be getting his EXRT at [facility] and I will await their input regarding the need for SpaceOAR and fiducials. He was initially seen for BPH with BOO and was found to have an 35mm right base nodule with a PSA of 3.7 which was from 10/19 prior to the initiation of finasteride which he is currently taking. He had a biopsy that demonstrated a 57ml prostate with a T2a N0 M0 Gleason 7(4+3) prostate cancer. The right base and mid lateral cores had Gleason 7(4+3) up to 90% involvement. The remaining 4 right cores had 7(3+4) disease up to 85% involvement. 3/6 left cores had low volume Gleason 6 disease. The bone scan is negative and a CT showed some small subpleural nodules favored to be nodes but no other evidence of metastatic disease. His comorbidities  include HTN, hyperlipidemia, and diabetes. His voiding symptoms have improved since his initial visit.   MSKCC nomogram predicts. 28% OCD, 69% ECE, 19% LNI and 20% SVI.    ROS:  ROS:  A complete review of systems was performed.  All systems are negative except for pertinent findings as noted.   Review of Systems  All other systems reviewed and are negative.   Allergies  Allergen Reactions  . Mirapex  [Pramipexole Dihydrochloride] Other (See Comments)  . Other Other (See Comments)  . Pramipexole Other (See Comments)  . Ciprofloxacin Nausea Only    Lightheaded  . Flagyl [Metronidazole] Other (See Comments)    Can't remember symptoms  . Ibuprofen   . Tramadol Nausea And Vomiting    Can't take any medication similar to Tramadol    Outpatient Encounter Medications as of 08/09/2019  Medication Sig  . amLODipine (NORVASC) 10 MG tablet Take 10 mg by mouth daily.  Marland Kitchen aspirin EC 81 MG tablet Take 81 mg by mouth daily.  Marland Kitchen atorvastatin (LIPITOR) 20 MG tablet Take 20 mg by mouth daily.  . calcium carbonate (OSCAL) 1500 (600 Ca) MG TABS tablet Take by mouth 2 (two) times daily with a meal.  . CHERRY PO Take by mouth.  . ezetimibe (ZETIA) 10 MG tablet Take 10 mg by  mouth daily.  . fenofibrate micronized (LOFIBRA) 134 MG capsule Take by mouth daily.  . fexofenadine (ALLEGRA) 180 MG tablet Take 180 mg by mouth daily.  . finasteride (PROSCAR) 5 MG tablet Take 5 mg by mouth daily.  . fluorouracil (EFUDEX) 5 % cream Apply 1 application topically as needed.   . fluticasone (FLONASE) 50 MCG/ACT nasal spray Place into both nostrils daily.  Marland Kitchen glipiZIDE (GLUCOTROL) 10 MG tablet Take 10 mg by mouth daily before breakfast.   . glucose blood test strip OneTouch Ultra Blue Test Strip  USE 1 STRIP VIA METER THREE TIMES A DAY DX: E11.9  . hydrocortisone (ANUSOL-HC) 2.5 % rectal cream Place 1 application rectally 2 (two) times daily.  Marland Kitchen ibuprofen (ADVIL,MOTRIN) 200 MG tablet Take 200-800 mg by mouth every  6 (six) hours as needed.  . Insulin Pen Needle 31G X 5 MM MISC BD Ultra-Fine Mini Pen Needle 31 gauge x 3/16"  . JARDIANCE 25 MG TABS tablet Take 25 mg by mouth daily.  Marland Kitchen LANTUS SOLOSTAR 100 UNIT/ML Solostar Pen Inject 36 Units into the skin at bedtime.   Marland Kitchen LEVEMIR FLEXTOUCH 100 UNIT/ML Pen INJECT 40 UNITS SUBCUTANEOUSLY AT BEDTIME  . loratadine (CLARITIN) 10 MG tablet Take 10 mg by mouth daily as needed for allergies.  . magnesium 30 MG tablet Take 30 mg by mouth 2 (two) times daily.  . magnesium gluconate (MAGONATE) 500 MG tablet Take by mouth.  . Melatonin 10 MG TABS Take by mouth.  . metoprolol tartrate (LOPRESSOR) 25 MG tablet Take 25 mg by mouth 2 (two) times daily.   . Misc Natural Products (BLACK CHERRY CONCENTRATE PO) Take 1,000 mg by mouth 2 (two) times daily.   . NON FORMULARY Uses CPAP nightly  . NOVOLOG FLEXPEN 100 UNIT/ML FlexPen   . ondansetron (ZOFRAN) 4 MG tablet Take 4 mg by mouth every 8 (eight) hours as needed.  Glory Rosebush Delica Lancets [id number] MISC OneTouch Delica Lancets 30 gauge  USE TO TEST 3 TIMES A DAY  . potassium chloride (KLOR-CON) 8 MEQ tablet Take 8 mEq by mouth daily.  Marland Kitchen rOPINIRole (REQUIP) 0.5 MG tablet TAKE 1 TABLET BY MOUTH EVERYDAY AT BEDTIME  . tamsulosin (FLOMAX) 0.4 MG CAPS capsule Take 2 capsules (0.8 mg total) by mouth daily.  . valsartan (DIOVAN) 80 MG tablet Take 80 mg by mouth daily.  . Vitamin D, Ergocalciferol, (DRISDOL) 1.25 MG (50000 UNIT) CAPS capsule Take 50,000 Units by mouth once a week.  . zolpidem (AMBIEN) 5 MG tablet Take by mouth.  Marland Kitchen POTASSIUM CHLORIDE ER PO Take by mouth.   Facility-Administered Encounter Medications as of 08/09/2019  Medication  . leuprolide (LUPRON) injection 30 mg    Past Medical History:  Diagnosis Date  . Arthritis   . Carpal tunnel syndrome   . Diverticulitis   . DM type 2 (diabetes mellitus, type 2) ([place])   . Gout   . History of colon polyps    2015 polyp x 1 dysplastic and adenomatous pieces.  .  Hypercholesterolemia   . Hypertension   . Insomnia   . Prostate cancer ([place])   . Prostate enlargement   . Restless leg syndrome   . Seasonal allergies   . Skin cancer     Past Surgical History:  Procedure Laterality Date  . APPENDECTOMY    . COLONOSCOPY  2015   Dr.Spainhour- polypectomy x 1 pieces dysplastic and adenomatous; Recc repeat in 2020 (5 years).  . COLONOSCOPY N/A 12/05/2017   Procedure: COLONOSCOPY;  Surgeon: Daneil Dolin, MD;  [facility];  Service: Endoscopy;  Laterality: N/A;  11:15am  . HERNIA REPAIR     inguinal x2  . POLYPECTOMY  12/05/2017   Procedure: POLYPECTOMY;  Surgeon: Daneil Dolin, MD;  [facility];  Service: Endoscopy;;  colon   . TONSILLECTOMY      Social History   Socioeconomic History  . Marital status: Married    Spouse name: Katharine Look  . Number of children: 3  . Years of education: Not on file  . Highest education level: Not on file  Occupational History  . Not on file  Tobacco Use  . Smoking status: Current Some Day Smoker    Types: Cigars  . Smokeless tobacco: Former Systems developer    Quit date: 05/20/1979  . Tobacco comment: 5 cigars per week  Substance and Sexual Activity  . Alcohol use: Yes    Alcohol/week: 0.0 standard drinks    Comment: wine once a week; occ whiskey  . Drug use: No  . Sexual activity: Yes  Other Topics Concern  . 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 Difficulty of Paying Living Expenses:   Food Insecurity:   . Worried About Charity fundraiser in the Last Year:   . Arboriculturist in the Last Year:   Transportation Needs:   . Film/video editor (Medical):   Marland Kitchen Lack of Transportation (Non-Medical):   Physical Activity:   . Days of Exercise per Week:   . Minutes of Exercise per Session:   Stress:   . Feeling of Stress  :   Social Connections:   . Frequency of Communication with Friends and Family:   . Frequency of Social Gatherings with Friends and Family:   . Attends Religious Services:   . Active Member of Clubs or Organizations:   . Attends Archivist Meetings:   Marland Kitchen Marital Status:   Intimate Partner Violence:   . Fear of Current or Ex-Partner:   . Emotionally Abused:   Marland Kitchen Physically Abused:   . Sexually Abused:     Family History  Problem Relation Age of Onset  . Hypertension Mother   . Heart failure Mother   . Prostate cancer Father   . Hypertension Child   . Hypertension Child   . Hypertension Child   . High Cholesterol Child   . High Cholesterol Child   . High Cholesterol Child   . Cancer Paternal Aunt        unknown type  . Cancer Paternal Aunt        unknown type  . Colon cancer Neg Hx        Objective: Vitals:   08/09/19 1054  BP: 123/74  Pulse: 60  Temp: (!) 97.5 F (36.4 C)     Physical Exam Vitals reviewed.  Constitutional:      Appearance: Normal appearance.  Neurological:     Mental Status: He is alert.     Lab Results:  No results found for this or any previous visit (from the past 24 hour(s)).   BMET No results for input(s): NA, K, CL, CO2, GLUCOSE, BUN, CREATININE, CALCIUM in the last 72 hours. PSA PSA  Date Value Ref Range Status  07/16/2019 <0.1 < OR = 4.0  ng/mL Final    Comment:    The total PSA value from this assay system is  standardized against the WHO standard. The test  result will be approximately 20% lower when compared  to the equimolar-standardized total PSA (Beckman  Coulter). Comparison of serial PSA results should be  interpreted with this fact in mind. . This test was performed using the Siemens  chemiluminescent method. Values obtained from  different assay methods cannot be used interchangeably. PSA levels, regardless of value, should not be interpreted as absolute evidence of the presence or absence of  disease.    Testosterone  Date Value Ref Range Status  07/16/2019 <10 (L) 250 - 827 ng/dL Final    Comment:    In hypogonadal males, Testosterone, Total, LC/MS/MS, is the recommended assay due to the diminished accuracy of immunoassay at levels below 250 ng/dL. This test code [id number]) must be collected in a red-top tube with no gel.        Studies/Results: No results found.    Assessment & Plan: BPH with BOO and Nocturia.  He is voiding better on tamsulosin which he will take 1-2 x daily.    Prostate cancer.  He was given Eligard 30mg  today.  He will return in 6 months with a PSA and testosterone. .  Pulmonary nodules.  He was to have a Chest CT prior to f/u but reports he was not contacted to have it done.  I have reordered the study.   CKD3.  He has seen nephrology and is on Vit D.     Meds ordered this encounter  Medications  . leuprolide (LUPRON) injection 30 mg     Orders Placed This Encounter  Procedures  . PSA    Standing Status:   Future    Standing Expiration Date:   08/08/2020  . Testosterone    Standing Status:   Future    Standing Expiration Date:   08/08/2020      Return in about 6 months (around 02/08/2020) for with PSA and testosterone. .   CC: VasireddyLanetta Inch, MD      Irine Seal 08/09/2019

## 2019-08-09 ENCOUNTER — Other Ambulatory Visit: Payer: Self-pay

## 2019-08-09 ENCOUNTER — Ambulatory Visit (INDEPENDENT_AMBULATORY_CARE_PROVIDER_SITE_OTHER): Payer: Medicare Other | Admitting: Urology

## 2019-08-09 VITALS — BP 123/74 | HR 60 | Temp 97.5°F | Ht 72.0 in | Wt 208.0 lb

## 2019-08-09 DIAGNOSIS — R351 Nocturia: Secondary | ICD-10-CM | POA: Diagnosis not present

## 2019-08-09 DIAGNOSIS — N138 Other obstructive and reflux uropathy: Secondary | ICD-10-CM

## 2019-08-09 DIAGNOSIS — R918 Other nonspecific abnormal finding of lung field: Secondary | ICD-10-CM

## 2019-08-09 DIAGNOSIS — C61 Malignant neoplasm of prostate: Secondary | ICD-10-CM

## 2019-08-09 DIAGNOSIS — N401 Enlarged prostate with lower urinary tract symptoms: Secondary | ICD-10-CM | POA: Diagnosis not present

## 2019-08-09 MED ORDER — LEUPROLIDE ACETATE (4 MONTH) 30 MG ~~LOC~~ KIT
30.0000 mg | PACK | Freq: Once | SUBCUTANEOUS | Status: AC
  Administered 2019-08-09: 12:00:00 30 mg via SUBCUTANEOUS

## 2019-08-09 MED ORDER — LEUPROLIDE ACETATE (4 MONTH) 30 MG IM KIT: 30.0000 mg | PACK | Freq: Once | INTRAMUSCULAR | Status: DC

## 2019-08-29 ENCOUNTER — Other Ambulatory Visit: Payer: Self-pay

## 2019-08-29 ENCOUNTER — Ambulatory Visit (HOSPITAL_COMMUNITY): Payer: Medicare Other

## 2019-08-29 ENCOUNTER — Ambulatory Visit (HOSPITAL_COMMUNITY)
Admission: RE | Admit: 2019-08-29 | Discharge: 2019-08-29 | Disposition: A | Discharge status: Home / Self Care | Payer: Medicare Other | Source: Ambulatory Visit | Attending: Urology | Admitting: Urology

## 2019-08-29 ENCOUNTER — Telehealth: Payer: Self-pay

## 2019-08-29 DIAGNOSIS — R918 Other nonspecific abnormal finding of lung field: Secondary | ICD-10-CM | POA: Diagnosis present

## 2019-08-29 NOTE — Telephone Encounter (Signed)
Pt notified of results

## 2019-08-29 NOTE — Telephone Encounter (Signed)
-----   Message from Irine Seal, MD sent at 08/29/2019 10:29 AM EDT ----- The previously noted nodules are no longer apparent.  ----- Message ----- From: Iris Pert, LPN Sent: [id number]  [id number] AM EDT To: Irine Seal, MD  Please review

## 2019-09-06 NOTE — Progress Notes (Signed)
I am not sure if I already reviewed this result, but the pulmonary nodules have resolved,  he does have atherosclerosis with calcification of the aorta and coronary arteries and this report needs to be sent to his PCP.

## 2020-02-10 IMAGING — CT CT ABD-PELV W/ CM
2 of 5 series · 16 of 46 positions shown, 18 images · IV contrast (omnipaque)
Comparison: None.

CLINICAL DATA: Staging of prostate cancer. History of hernia repair
and appendectomy.

EXAM:
CT ABDOMEN AND PELVIS WITH CONTRAST
TECHNIQUE: Multidetector CT imaging of the abdomen and pelvis was performed
using the standard protocol following bolus administration of
intravenous contrast.
CONTRAST:  100mL OMNIPAQUE IOHEXOL 300 MG/ML  SOLN

[Series 2: axial st · axial · 0.81mm/px · z∈[-518,-18]mm · 13 of 116 slices shown, 15 images]
[im 8/116  soft-tissue]
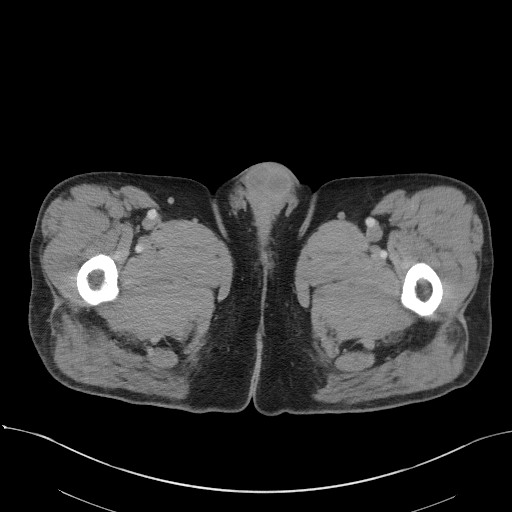
[im 8/116  bone]
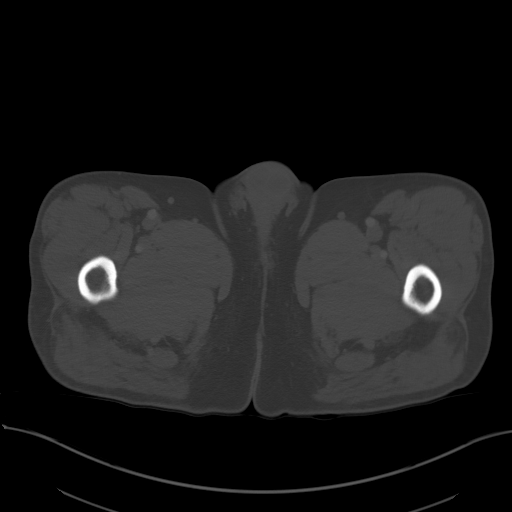
[im 16/116  soft-tissue]
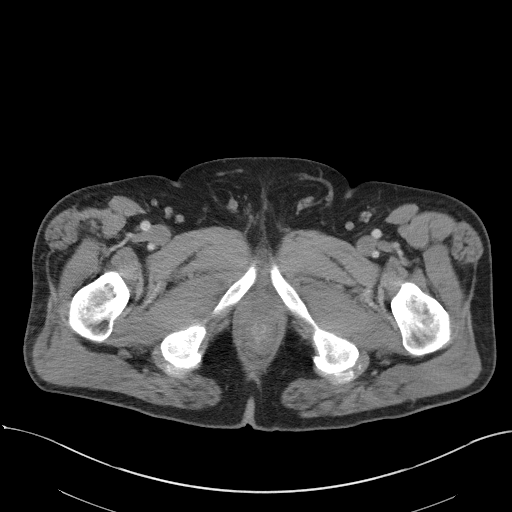
[im 24/116  soft-tissue]
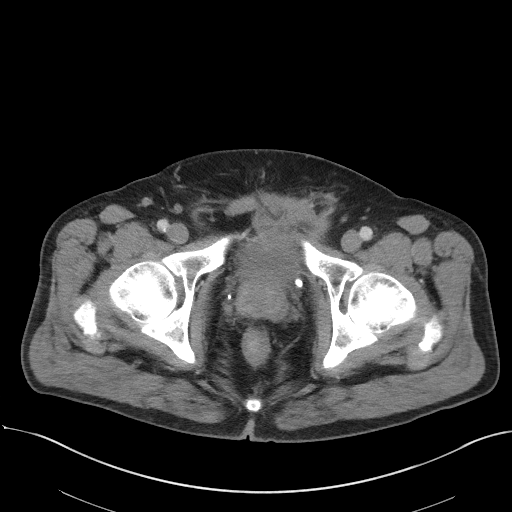
[im 31/116  soft-tissue]
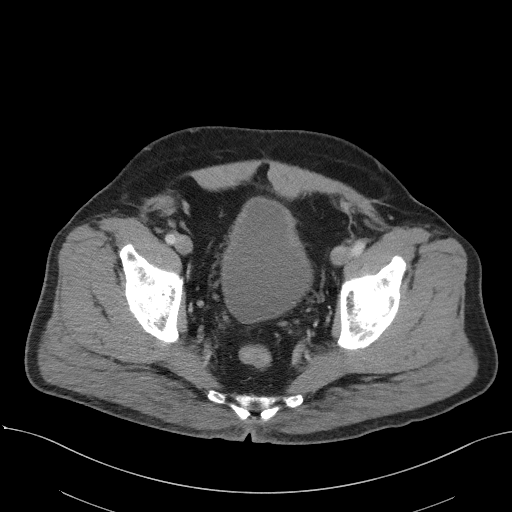
[im 39/116  soft-tissue]
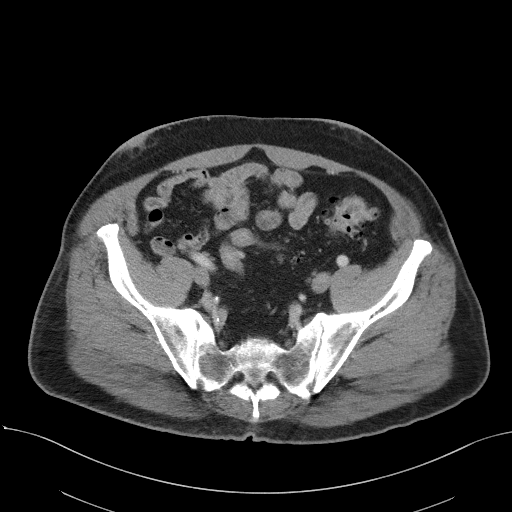
[im 47/116  soft-tissue]
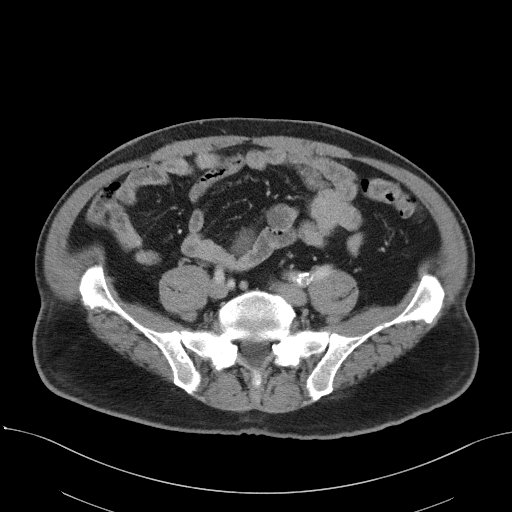
[im 62/116  soft-tissue]
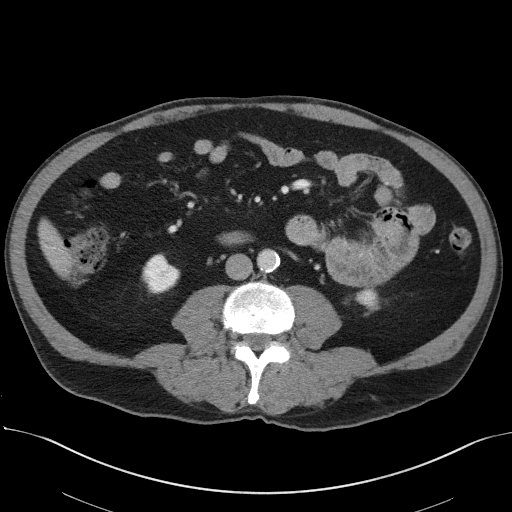
[im 70/116  soft-tissue]
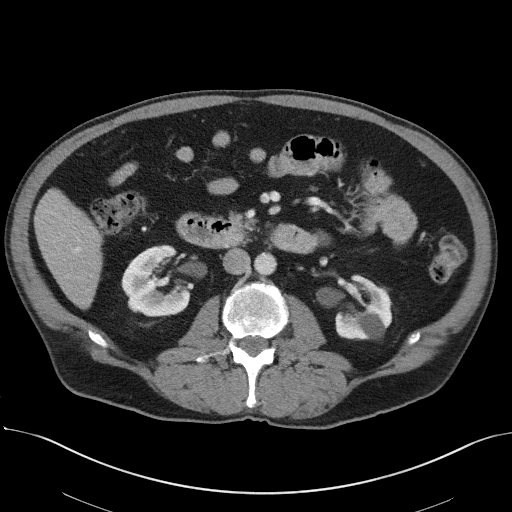
[im 77/116  soft-tissue]
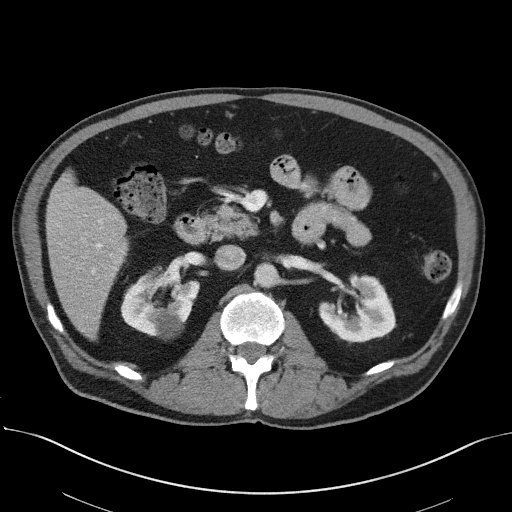
[im 77/116  bone]
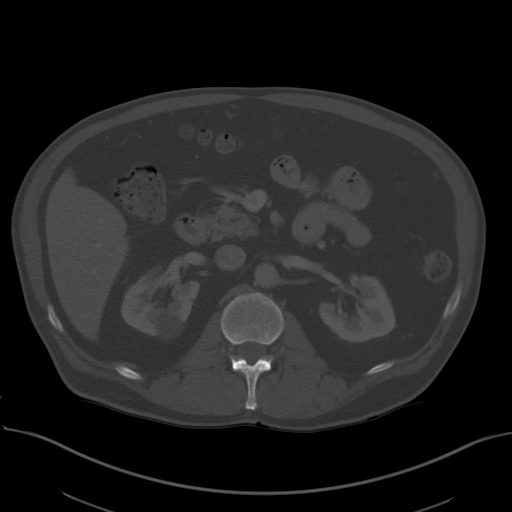
[im 85/116  soft-tissue]
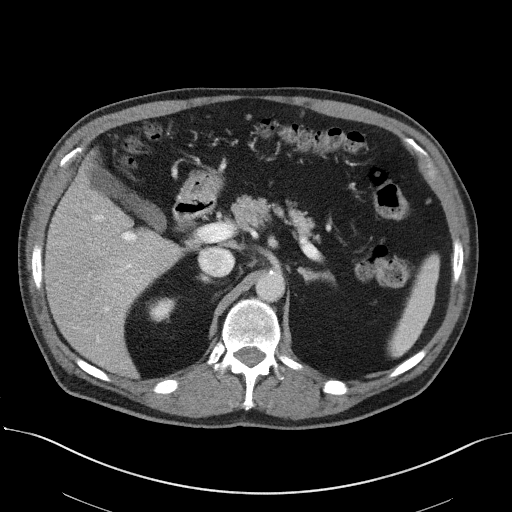
[im 93/116  soft-tissue]
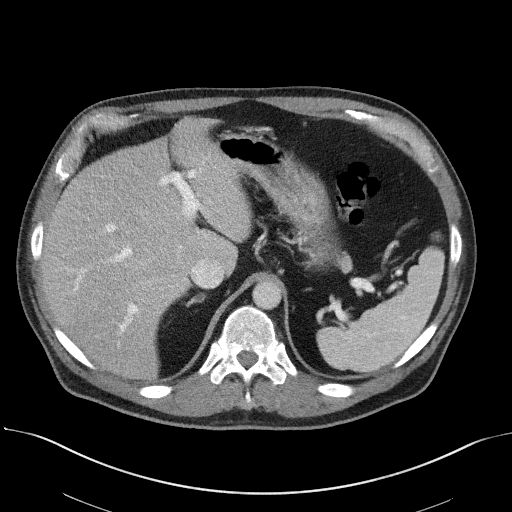
[im 100/116  soft-tissue]
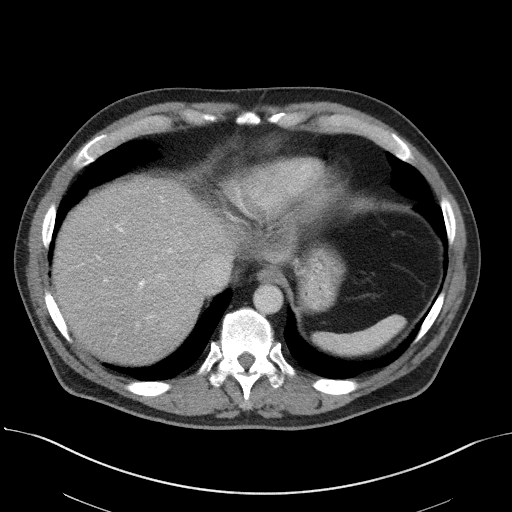
[im 108/116  soft-tissue]
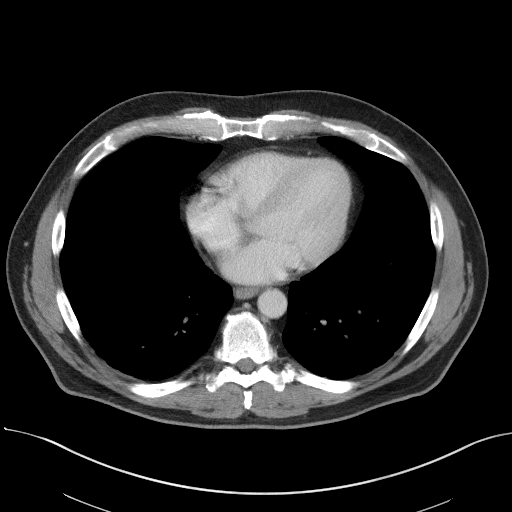

[Series 5: coronal st · coronal · 0.84mm/px · 3 of 93 slices shown]
[im 31/93  soft-tissue]
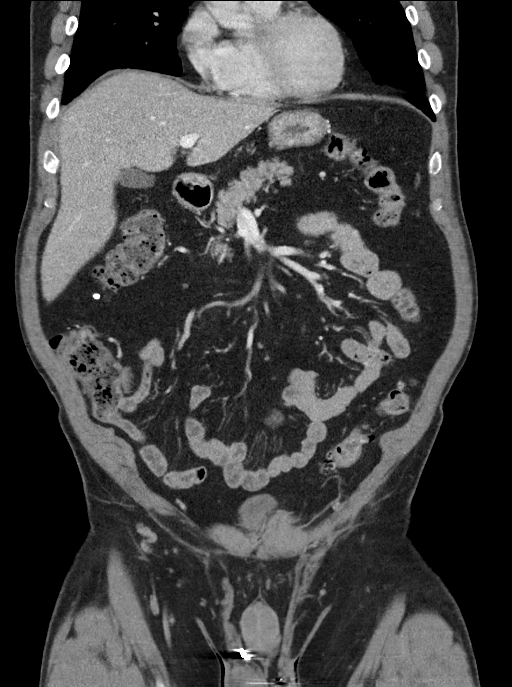
[im 41/93  soft-tissue]
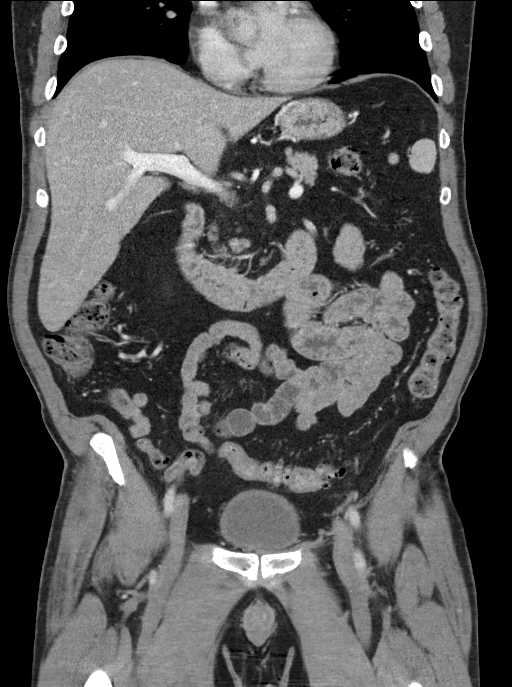
[im 52/93  soft-tissue]
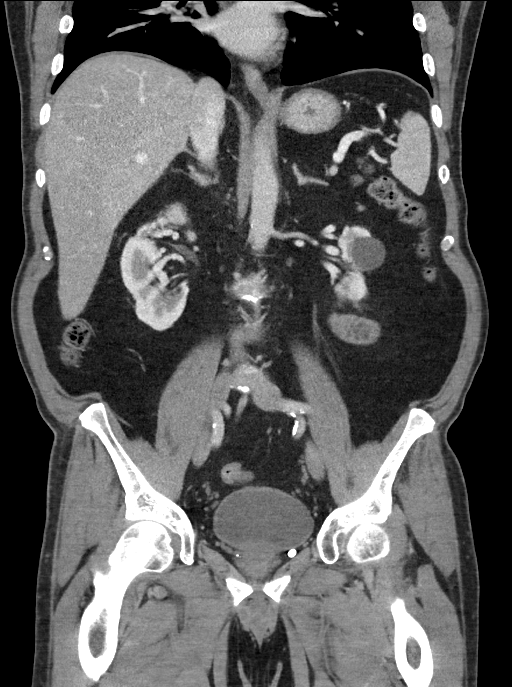

[16 of 46 positions shown; findings below may reference images not displayed]

FINDINGS: Lower chest: Subpleural right lower lobe pulmonary nodules on the
order of 3 mm on series 4. Normal heart size without pericardial or
pleural effusion.

Hepatobiliary: Hepatomegaly at 21.6 cm. Normal gallbladder, without
biliary ductal dilatation.

Pancreas: Normal, without mass or ductal dilatation.

Spleen: Normal in size, without focal abnormality.

Adrenals/Urinary Tract: Normal adrenal glands. Mild bilateral renal
cortical thinning. Fluid density bilateral renal lesions are
consistent with cysts. No hydronephrosis. Normal urinary bladder.

Stomach/Bowel: Normal stomach, without wall thickening. Extensive
colonic diverticulosis. Normal terminal ileum. Appendectomy. Normal
small bowel.

Vascular/Lymphatic: Aortic atherosclerosis. No abdominopelvic
adenopathy.

Reproductive: Normal prostate. Symmetric seminal vesicles.

Other: No significant free fluid. Left inguinal hernia repair.

Musculoskeletal: Degenerate disc disease, primarily at L4-5.
IMPRESSION: 1. No acute process or evidence of metastatic disease in the abdomen
or pelvis.
2. Hepatomegaly.
3. Aortic Atherosclerosis ([id number]).
4. Tiny right base pulmonary nodules are favored to represent
subpleural lymph nodes. Recommend attention on follow-up.

## 2020-02-14 ENCOUNTER — Ambulatory Visit: Payer: Medicare Other | Admitting: Urology

## 2020-02-19 ENCOUNTER — Other Ambulatory Visit: Payer: Medicare Other

## 2020-02-19 ENCOUNTER — Other Ambulatory Visit: Payer: Self-pay

## 2020-02-19 DIAGNOSIS — C61 Malignant neoplasm of prostate: Secondary | ICD-10-CM

## 2020-02-20 NOTE — Progress Notes (Signed)
Subjective:  1. 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 Nocturia   4. BPH with urinary obstruction     I have prostate cancer. HPI: Glen Guerrero is a 74 year-old male established patient who is here evaluation for treatment of prostate cancer.  His prostate cancer was diagnosed 01/04/2019. He does have the pathology report from his biopsy. His PSA at his time of diagnosis was 3.7.   02/21/20: Glen Guerrero returns today for his history of prostate cancer.   His PSA from 10/27 is not back yet. It was <0.1 and the testosterone was castrate at <10 in 3/21.   He has persistent fatigue and hot flashes.  He has been feeling better for the last 2 weeks.   He continues to have nocturia 3-4x and he has some intermittency.  He is on tamsulosin 2 daily.   He has no hematuria.  He has no GI complaints.  His weight is stable.     05/31/19: Glen Guerrero returns today in f/u for his prostate cancer.  He was started on Firmagon 240mg  on 02/22/19 and he had Eligard 30mg  SQ on 03/26/19 and on 08/09/19.    He completed radiation in mid February.  He remains on tamsulosin.   His nocturia has improved and is down to 2-3x.    He has no further  pain with voiding or hesitancy.   He does better if he walks around for a bit before voiding.      He has been seen at the [facility] for his T2a Gleason 7(4+3) prostate cancer. He has elected to proceed with EXRT with adjuvanct ADT for 6-8 months. He will be getting his EXRT at [facility] and I will await their input regarding the need for [employer] and fiducials. He was initially seen for BPH with BOO and was found to have an 59mm right base nodule with a PSA of 3.7 which was from 10/19 prior to the initiation of finasteride which he is currently taking. He had a biopsy that demonstrated a 49ml prostate with a T2a N0 M0 Gleason 7(4+3) prostate cancer. The right base and mid lateral cores had Gleason 7(4+3) up to 90% involvement. The remaining 4 right cores had 7(3+4)  disease up to 85% involvement. 3/6 left cores had low volume Gleason 6 disease. The bone scan is negative and a CT showed some small subpleural nodules favored to be nodes but no other evidence of metastatic disease. His comorbidities include HTN, hyperlipidemia, and diabetes. His voiding symptoms have improved since his initial visit.   MSKCC nomogram predicts. 28% OCD, 69% ECE, 19% LNI and 20% SVI.    IPSS    Row Name 02/21/20 1300         International Prostate Symptom Score   How often have you had the sensation of not emptying your bladder? About half the time     How often have you had to urinate less than every two hours? Less than 1 in 5 times     How often have you found you stopped and started again several times when you urinated? Almost always     How often have you found it difficult to postpone urination? Less than 1 in 5 times     How often have you had a weak urinary stream? More than half the time     How often have you had to strain to start urination? Not at All     How many  times did you typically get up at night to urinate? 4 Times     Total IPSS Score 18       Quality of Life due to urinary symptoms   If you were to spend the rest of your life with your urinary condition just the way it is now how would you feel about that? Unhappy             ROS:  ROS:  A complete review of systems was performed.  All systems are negative except for pertinent findings as noted.   Review of Systems  All other systems reviewed and are negative.   Allergies  Allergen Reactions  . Mirapex  [Pramipexole Dihydrochloride] Other (See Comments)  . Other Other (See Comments)  . Pramipexole Other (See Comments)  . Ciprofloxacin Nausea Only    Lightheaded  . Flagyl [Metronidazole] Other (See Comments)    Can't remember symptoms  . Ibuprofen   . Tramadol Nausea And Vomiting    Can't take any medication similar to Tramadol    Outpatient Encounter Medications as of  02/21/2020  Medication Sig  . amLODipine (NORVASC) 10 MG tablet Take 10 mg by mouth daily.  Marland Kitchen aspirin EC 81 MG tablet Take 81 mg by mouth daily.  Marland Kitchen atorvastatin (LIPITOR) 20 MG tablet Take 20 mg by mouth daily.  . calcium carbonate (OSCAL) 1500 (600 Ca) MG TABS tablet Take by mouth 2 (two) times daily with a meal.  . CHERRY PO Take by mouth.  . ezetimibe (ZETIA) 10 MG tablet Take 10 mg by mouth daily.  . fenofibrate micronized (LOFIBRA) 134 MG capsule Take by mouth daily.  . fexofenadine (ALLEGRA) 180 MG tablet Take 180 mg by mouth daily.  . finasteride (PROSCAR) 5 MG tablet Take 5 mg by mouth daily.  . fluorouracil (EFUDEX) 5 % cream Apply 1 application topically as needed.   . fluticasone (FLONASE) 50 MCG/ACT nasal spray Place into both nostrils daily.  Marland Kitchen glipiZIDE (GLUCOTROL) 10 MG tablet Take 10 mg by mouth daily before breakfast.   . glucose blood test strip OneTouch Ultra Blue Test Strip  USE 1 STRIP VIA METER THREE TIMES A DAY DX: E11.9  . hydrocortisone (ANUSOL-HC) 2.5 % rectal cream Place 1 application rectally 2 (two) times daily.  Marland Kitchen ibuprofen (ADVIL,MOTRIN) 200 MG tablet Take 200-800 mg by mouth every 6 (six) hours as needed.  . Insulin Pen Needle 31G X 5 MM MISC BD Ultra-Fine Mini Pen Needle 31 gauge x 3/16"  . JARDIANCE 25 MG TABS tablet Take 25 mg by mouth daily.  Marland Kitchen LANTUS SOLOSTAR 100 UNIT/ML Solostar Pen Inject 36 Units into the skin at bedtime.   Marland Kitchen LEVEMIR FLEXTOUCH 100 UNIT/ML Pen INJECT 40 UNITS SUBCUTANEOUSLY AT BEDTIME  . loratadine (CLARITIN) 10 MG tablet Take 10 mg by mouth daily as needed for allergies.  . magnesium 30 MG tablet Take 30 mg by mouth 2 (two) times daily.  . magnesium gluconate (MAGONATE) 500 MG tablet Take by mouth.  . Melatonin 10 MG TABS Take by mouth.  . metoprolol tartrate (LOPRESSOR) 25 MG tablet Take 25 mg by mouth 2 (two) times daily.   . Misc Natural Products (BLACK CHERRY CONCENTRATE PO) Take 1,000 mg by mouth 2 (two) times daily.   . NON  FORMULARY Uses CPAP nightly  . NOVOLOG FLEXPEN 100 UNIT/ML FlexPen   . ondansetron (ZOFRAN) 4 MG tablet Take 4 mg by mouth every 8 (eight) hours as needed.  Glory Rosebush Delica Lancets 19F  MISC OneTouch Delica Lancets 30 gauge  USE TO TEST 3 TIMES A DAY  . potassium chloride (KLOR-CON) 8 MEQ tablet Take 8 mEq by mouth daily.  Marland Kitchen POTASSIUM CHLORIDE ER PO Take by mouth.  Marland Kitchen rOPINIRole (REQUIP) 0.5 MG tablet TAKE 1 TABLET BY MOUTH EVERYDAY AT BEDTIME  . tamsulosin (FLOMAX) 0.4 MG CAPS capsule Take 2 capsules (0.8 mg total) by mouth daily.  Marland Kitchen triamcinolone cream (KENALOG) 0.1 % triamcinolone acetonide 0.1 % topical cream  APPLY TO ITCHY SPOTS ON BACK  . valsartan (DIOVAN) 80 MG tablet Take 80 mg by mouth daily.  . Vitamin D, Ergocalciferol, (DRISDOL) 1.25 MG (50000 UNIT) CAPS capsule Take 50,000 Units by mouth once a week.  . zolpidem (AMBIEN) 5 MG tablet Take by mouth.  Marland Kitchen ascorbic acid (VITAMIN C) 1000 MG tablet Take by mouth.  . Coenzyme Q10 200 MG capsule Take by mouth.  . Continuous Blood Gluc Sensor (FREESTYLE LIBRE 14 DAY SENSOR) MISC Apply topically.   No facility-administered encounter medications on file as of 02/21/2020.    Past Medical History:  Diagnosis Date  . Arthritis   . Carpal tunnel syndrome   . Diverticulitis   . DM type 2 (diabetes mellitus, type 2) ([place])   . Gout   . History of colon polyps    2015 polyp x 1 dysplastic and adenomatous pieces.  . Hypercholesterolemia   . Hypertension   . Insomnia   . Prostate cancer ([place])   . Prostate enlargement   . Restless leg syndrome   . Seasonal allergies   . Skin cancer     Past Surgical History:  Procedure Laterality Date  . APPENDECTOMY    . COLONOSCOPY  2015   Dr.Spainhour- polypectomy x 1 pieces dysplastic and adenomatous; Recc repeat in 2020 (5 years).  . COLONOSCOPY N/A 12/05/2017   Procedure: COLONOSCOPY;  Surgeon: Daneil Dolin, MD;  [facility];  Service: Endoscopy;  Laterality: N/A;  11:15am  .  HERNIA REPAIR     inguinal x2  . POLYPECTOMY  12/05/2017   Procedure: POLYPECTOMY;  Surgeon: Daneil Dolin, MD;  Location: [facility];  Service: Endoscopy;;  colon   . TONSILLECTOMY      Social History   Socioeconomic History  . Marital status: Married    Spouse name: Katharine Look  . Number of children: 3  . Years of education: Not on file  . Highest education level: Not on file  Occupational History  . Not on file  Tobacco Use  . Smoking status: Current Some Day Smoker    Types: Cigars  . Smokeless tobacco: Former Systems developer    Quit date: 05/20/1979  . Tobacco comment: 5 cigars per week  Vaping Use  . Vaping Use: Never used  Substance and Sexual Activity  . Alcohol use: Yes    Alcohol/week: 0.0 standard drinks    Comment: wine once a week; occ whiskey  . Drug use: No  . Sexual activity: Yes  Other Topics Concern  . 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 Difficulty of Paying Living Expenses: Not on file  Food Insecurity:   . Worried About Charity fundraiser in the Last Year: Not on file  . Ran Out of Food in the Last Year: Not on file  Transportation Needs:   . Film/video editor (Medical): Not on file  . Lack of Transportation (Non-Medical): Not on file  Physical Activity:   . Days of Exercise per Week: Not on file  . Minutes of Exercise per Session: Not on file  Stress:   . Feeling of Stress : Not on file  Social Connections:   . Frequency of Communication with Friends and Family: Not on file  . Frequency of Social Gatherings with Friends and Family: Not on file  . Attends Religious Services: Not on file  . Active Member of Clubs or Organizations: Not on file  . Attends Archivist Meetings: Not on file  . Marital Status: Not on file  Intimate Partner Violence:   . Fear of Current or  Ex-Partner: Not on file  . Emotionally Abused: Not on file  . Physically Abused: Not on file  . Sexually Abused: Not on file    Family History  Problem Relation Age of Onset  . Hypertension Mother   . Heart failure Mother   . Prostate cancer Father   . Hypertension Child   . Hypertension Child   . Hypertension Child   . High Cholesterol Child   . High Cholesterol Child   . High Cholesterol Child   . Cancer Paternal Aunt        unknown type  . Cancer Paternal Aunt        unknown type  . Colon cancer Neg Hx        Objective: Vitals:   02/21/20 1315  BP: 118/71  Pulse: 65  Temp: 98.1 F (36.7 C)     Physical Exam  Lab Results:  No results found for this or any previous visit (from the past 24 hour(s)).   BMET No results for input(s): NA, K, CL, CO2, GLUCOSE, BUN, CREATININE, CALCIUM in the last 72 hours. PSA PSA  Date Value Ref Range Status  07/16/2019 <0.1 < OR = 4.0 ng/mL Final    Comment:    The total PSA value from this assay system is  standardized against the WHO standard. The test  result will be approximately 20% lower when compared  to the equimolar-standardized total PSA (Beckman  Coulter). Comparison of serial PSA results should be  interpreted with this fact in mind. . This test was performed using the Siemens  chemiluminescent method. Values obtained from  different assay methods cannot be used interchangeably. PSA levels, regardless of value, should not be interpreted as absolute evidence of the presence or absence of disease.    Testosterone  Date Value Ref Range Status  07/16/2019 <10 (L) 250 - 827 ng/dL Final    Comment:    In hypogonadal males, Testosterone, Total, LC/MS/MS, is the recommended assay due to the diminished accuracy of immunoassay at levels below 250 ng/dL. This test code [phone]) must be collected in a red-top tube with no gel.      No results found for this or any previous visit (from the past 2160  hour(s)).  UA is clear.   Studies/Results: Chest CT from 5/21 demonstrated no nodules.      Assessment & Plan: BPH with BOO and Nocturia.  He is voiding better on tamsulosin BID but he still has moderate LUTS with nocturia.     Prostate cancer.  His results from 10/27 are still pending.    He will return in 6 months with a PSA and testosterone. .  Pulmonary nodules.  His CT Chest in 5/21  showed no nodules.   .     No orders of the defined types were placed in this encounter.    Orders Placed This Encounter  Procedures  . PSA    Standing Status:   Future    Standing Expiration Date:   02/20/2021  . Testosterone    Standing Status:   Future    Standing Expiration Date:   02/20/2021      Return in about 6 months (around 08/21/2020) for with labs.   CC: Vasireddy, Lanetta Inch, MD      Irine Seal 02/21/2020

## 2020-02-21 ENCOUNTER — Encounter: Payer: Self-pay | Admitting: Urology

## 2020-02-21 ENCOUNTER — Ambulatory Visit (INDEPENDENT_AMBULATORY_CARE_PROVIDER_SITE_OTHER): Payer: Medicare Other | Admitting: Urology

## 2020-02-21 ENCOUNTER — Other Ambulatory Visit: Payer: Self-pay

## 2020-02-21 VITALS — BP 118/71 | HR 65 | Temp 98.1°F | Ht 72.0 in | Wt 208.0 lb

## 2020-02-21 DIAGNOSIS — R351 Nocturia: Secondary | ICD-10-CM

## 2020-02-21 DIAGNOSIS — N401 Enlarged prostate with lower urinary tract symptoms: Secondary | ICD-10-CM | POA: Diagnosis not present

## 2020-02-21 DIAGNOSIS — Z8546 Personal history of malignant neoplasm of prostate: Secondary | ICD-10-CM

## 2020-02-21 DIAGNOSIS — N138 Other obstructive and reflux uropathy: Secondary | ICD-10-CM

## 2020-02-21 DIAGNOSIS — E291 Testicular hypofunction: Secondary | ICD-10-CM

## 2020-02-21 NOTE — Progress Notes (Signed)
Urological Symptom Review  Patient is experiencing the following symptoms: Frequent urination Get up at night to urinate Stream starts and stops Weak stream   Review of Systems  Gastrointestinal (upper)  : Negative for upper GI symptoms  Gastrointestinal (lower) : Negative for lower GI symptoms  Constitutional : Negative for symptoms  Skin: Negative for skin symptoms  Eyes: Negative for eye symptoms  Ear/Nose/Throat : Negative for Ear/Nose/Throat symptoms  Hematologic/Lymphatic: Negative for Hematologic/Lymphatic symptoms  Cardiovascular : Negative for cardiovascular symptoms  Respiratory : Negative for respiratory symptoms  Endocrine: Negative for endocrine symptoms  Musculoskeletal: Back pain  Neurological: Negative for neurological symptoms  Psychologic: Negative for psychiatric symptoms 

## 2020-05-28 ENCOUNTER — Ambulatory Visit (INDEPENDENT_AMBULATORY_CARE_PROVIDER_SITE_OTHER): Payer: Medicare Other | Admitting: Cardiology

## 2020-05-28 ENCOUNTER — Encounter: Payer: Self-pay | Admitting: Cardiology

## 2020-05-28 VITALS — BP 126/68 | HR 56 | Ht 72.0 in | Wt 216.0 lb

## 2020-05-28 DIAGNOSIS — R001 Bradycardia, unspecified: Secondary | ICD-10-CM

## 2020-05-28 DIAGNOSIS — I10 Essential (primary) hypertension: Secondary | ICD-10-CM | POA: Insufficient documentation

## 2020-05-28 DIAGNOSIS — I440 Atrioventricular block, first degree: Secondary | ICD-10-CM

## 2020-05-28 NOTE — Patient Instructions (Signed)
Your physician recommends that you schedule a follow-up appointment in: [place] - PENDING WITH DR [facility]  Your physician has recommended you make the following change in your medication:   STOP METOPROLOL   Thank you for choosing [facility]!!

## 2020-05-28 NOTE — Progress Notes (Signed)
Clinical Summary Glen Guerrero is a 75 y.o.male seen as new consult, referred by Dr Glen Guerrero for the following medical problems.  1. Sinus bradycardia, 1st degree AV block - mild fatigue at times, nonspecific - no dizziness, no passing out.    2. Prostate cancer  SH: raced cars Past Medical History:  Diagnosis Date  . Arthritis   . Carpal tunnel syndrome   . Diverticulitis   . DM type 2 (diabetes mellitus, type 2) ([place])   . Gout   . History of colon polyps    2015 polyp x 1 dysplastic and adenomatous pieces.  . Hypercholesterolemia   . Hypertension   . Insomnia   . Prostate cancer ([place])   . Prostate enlargement   . Restless leg syndrome   . Seasonal allergies   . Skin cancer      Allergies  Allergen Reactions  . Mirapex  [Pramipexole Dihydrochloride] Other (See Comments)  . Other Other (See Comments)  . Pramipexole Other (See Comments)  . Ciprofloxacin Nausea Only    Lightheaded  . Flagyl [Metronidazole] Other (See Comments)    Can't remember symptoms  . Ibuprofen   . Tramadol Nausea And Vomiting    Can't take any medication similar to Tramadol     Current Outpatient Medications  Medication Sig Dispense Refill  . amLODipine (NORVASC) 10 MG tablet Take 10 mg by mouth daily.    Marland Kitchen ascorbic acid (VITAMIN C) 1000 MG tablet Take by mouth.    Marland Kitchen aspirin EC 81 MG tablet Take 81 mg by mouth daily.    Marland Kitchen atorvastatin (LIPITOR) 20 MG tablet Take 20 mg by mouth daily.    . calcium carbonate (OSCAL) 1500 (600 Ca) MG TABS tablet Take by mouth 2 (two) times daily with a meal.    . CHERRY PO Take by mouth.    . Coenzyme Q10 200 MG capsule Take by mouth.    . Continuous Blood Gluc Sensor (FREESTYLE LIBRE 14 DAY SENSOR) MISC Apply topically.    Marland Kitchen ezetimibe (ZETIA) 10 MG tablet Take 10 mg by mouth daily.    . fenofibrate micronized (LOFIBRA) 134 MG capsule Take by mouth daily.    . fexofenadine (ALLEGRA) 180 MG tablet Take 180 mg by mouth daily.    . finasteride (PROSCAR) 5 MG  tablet Take 5 mg by mouth daily.    . fluorouracil (EFUDEX) 5 % cream Apply 1 application topically as needed.     . fluticasone (FLONASE) 50 MCG/ACT nasal spray Place into both nostrils daily.    Marland Kitchen glipiZIDE (GLUCOTROL) 10 MG tablet Take 10 mg by mouth daily before breakfast.     . glucose blood test strip OneTouch Ultra Blue Test Strip  USE 1 STRIP VIA METER THREE TIMES A DAY DX: E11.9    . hydrocortisone (ANUSOL-HC) 2.5 % rectal cream Place 1 application rectally 2 (two) times daily. 30 g 1  . ibuprofen (ADVIL,MOTRIN) 200 MG tablet Take 200-800 mg by mouth every 6 (six) hours as needed.    . Insulin Pen Needle 31G X 5 MM MISC BD Ultra-Fine Mini Pen Needle 31 gauge x 3/16"    . JARDIANCE 25 MG TABS tablet Take 25 mg by mouth daily.  1  . LANTUS SOLOSTAR 100 UNIT/ML Solostar Pen Inject 36 Units into the skin at bedtime.   3  . LEVEMIR FLEXTOUCH 100 UNIT/ML Pen INJECT 40 UNITS SUBCUTANEOUSLY AT BEDTIME    . loratadine (CLARITIN) 10 MG tablet Take 10 mg  by mouth daily as needed for allergies.    . magnesium 30 MG tablet Take 30 mg by mouth 2 (two) times daily.    . magnesium gluconate (MAGONATE) 500 MG tablet Take by mouth.    . Melatonin 10 MG TABS Take by mouth.    . metoprolol tartrate (LOPRESSOR) 25 MG tablet Take 25 mg by mouth 2 (two) times daily.     . Misc Natural Products (BLACK CHERRY CONCENTRATE PO) Take 1,000 mg by mouth 2 (two) times daily.     . NON FORMULARY Uses CPAP nightly    . NOVOLOG FLEXPEN 100 UNIT/ML FlexPen     . ondansetron (ZOFRAN) 4 MG tablet Take 4 mg by mouth every 8 (eight) hours as needed.    Glen Guerrero Delica Lancets 56L MISC OneTouch Delica Lancets 30 gauge  USE TO TEST 3 TIMES A DAY    . potassium chloride (KLOR-CON) 8 MEQ tablet Take 8 mEq by mouth daily.    Marland Kitchen POTASSIUM CHLORIDE ER PO Take by mouth.    Marland Kitchen rOPINIRole (REQUIP) 0.5 MG tablet TAKE 1 TABLET BY MOUTH EVERYDAY AT BEDTIME    . tamsulosin (FLOMAX) 0.4 MG CAPS capsule Take 2 capsules (0.8 mg total) by  mouth daily. 60 capsule 11  . triamcinolone cream (KENALOG) 0.1 % triamcinolone acetonide 0.1 % topical cream  APPLY TO ITCHY SPOTS ON BACK    . valsartan (DIOVAN) 80 MG tablet Take 80 mg by mouth daily.    . Vitamin D, Ergocalciferol, (DRISDOL) 1.25 MG (50000 UNIT) CAPS capsule Take 50,000 Units by mouth once a week.    . zolpidem (AMBIEN) 5 MG tablet Take by mouth.     No current facility-administered medications for this visit.     Past Surgical History:  Procedure Laterality Date  . APPENDECTOMY    . COLONOSCOPY  2015   Dr.Spainhour- polypectomy x 1 pieces dysplastic and adenomatous; Recc repeat in 2020 (5 years).  . COLONOSCOPY N/A 12/05/2017   Procedure: COLONOSCOPY;  Surgeon: Glen Dolin, MD;  [facility];  Service: Endoscopy;  Laterality: N/A;  11:15am  . HERNIA REPAIR     inguinal x2  . POLYPECTOMY  12/05/2017   Procedure: POLYPECTOMY;  Surgeon: Glen Dolin, MD;  [facility];  Service: Endoscopy;;  colon   . TONSILLECTOMY       Allergies  Allergen Reactions  . Mirapex  [Pramipexole Dihydrochloride] Other (See Comments)  . Other Other (See Comments)  . Pramipexole Other (See Comments)  . Ciprofloxacin Nausea Only    Lightheaded  . Flagyl [Metronidazole] Other (See Comments)    Can't remember symptoms  . Ibuprofen   . Tramadol Nausea And Vomiting    Can't take any medication similar to Tramadol      Family History  Problem Relation Age of Onset  . Hypertension Mother   . Heart failure Mother   . Prostate cancer Father   . Hypertension Child   . Hypertension Child   . Hypertension Child   . High Cholesterol Child   . High Cholesterol Child   . High Cholesterol Child   . Cancer Paternal Aunt        unknown type  . Cancer Paternal Aunt        unknown type  . Colon cancer Neg Hx      Social History Glen Guerrero reports that he has been smoking cigars. He quit smokeless tobacco use about 41 years ago. Glen Guerrero reports  current alcohol use.   Review of Systems CONSTITUTIONAL:occasional fatigue  HEENT: Eyes: No visual loss, blurred vision, double vision or yellow sclerae.No hearing loss, sneezing, congestion, runny nose or sore throat.  SKIN: No rash or itching.  CARDIOVASCULAR: per hpi RESPIRATORY: No shortness of breath, cough or sputum.  GASTROINTESTINAL: No anorexia, nausea, vomiting or diarrhea. No abdominal pain or blood.  GENITOURINARY: No burning on urination, no polyuria NEUROLOGICAL: No headache, dizziness, syncope, paralysis, ataxia, numbness or tingling in the extremities. No change in bowel or bladder control.  MUSCULOSKELETAL: No muscle, back pain, joint pain or stiffness.  LYMPHATICS: No enlarged nodes. No history of splenectomy.  PSYCHIATRIC: No history of depression or anxiety.  ENDOCRINOLOGIC: No reports of sweating, cold or heat intolerance. No polyuria or polydipsia.  Marland Kitchen   Physical Examination Today's Vitals   05/28/20 0852  BP: 126/68  Pulse: (!) 56  SpO2: 98%  Weight: 216 lb (98 kg)  Height: 6' (1.829 m)   Body mass index is 29.29 kg/m.  Gen: resting comfortably, no acute distress HEENT: no scleral icterus, pupils equal round and reactive, no palptable cervical adenopathy,  [id number], brady 55,  No mrg Resp: Clear to auscultation bilaterally GI: abdomen is soft, non-tender, non-distended, normal bowel sounds, no hepatosplenomegaly MSK: extremities are warm, no edema.  Skin: warm, no rash Neuro:  no focal deficits Psych: appropriate affect   Diagnostic Studies     Assessment and Plan  1. Sinus bradycardia/1st degree av block -nonspecific faitgue, unclear if related - will d/c metoprolol, recheck EKG in 1 week and f/u symptoms   F/u pending      Arnoldo Lenis, M.D.

## 2020-06-04 ENCOUNTER — Ambulatory Visit (INDEPENDENT_AMBULATORY_CARE_PROVIDER_SITE_OTHER): Payer: Medicare Other | Admitting: *Deleted

## 2020-06-04 DIAGNOSIS — R001 Bradycardia, unspecified: Secondary | ICD-10-CM

## 2020-06-04 NOTE — Progress Notes (Signed)
Pt voiced understanding and appreciative  

## 2020-06-04 NOTE — Progress Notes (Signed)
Pt here for EKG and BP 148/82 HR on EKG 67 - pt denies any symptoms at this time - stopped metoprolol 05/28/20 - EKG done and will forward to provider

## 2020-06-04 NOTE — Progress Notes (Signed)
EKG shows HRs have normalized off metoprolol. 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 Overall EKG looks good, would not plan any further cardiac workup   J Shamari Lofquist MD

## 2020-07-27 ENCOUNTER — Other Ambulatory Visit: Payer: Self-pay

## 2020-07-27 ENCOUNTER — Telehealth: Payer: Self-pay | Admitting: Urology

## 2020-07-27 MED ORDER — TAMSULOSIN HCL 0.4 MG PO CAPS: 0.4000 mg | ORAL_CAPSULE | Freq: Every day | ORAL | 0 refills | Status: DC

## 2020-07-27 NOTE — Telephone Encounter (Signed)
Called pts wife back. Needed 6 month return appointment and Tamsulosin rx until then. Both were done and wife notified. Pts wife said pt is to do blood work next week at Dr Durene Cal office. Will get PSA and Testosterone done then.

## 2020-07-27 NOTE — Telephone Encounter (Signed)
Pt's wife called about needing to get a refill on his medication. Please call her when you can. Thanks!

## 2020-08-19 ENCOUNTER — Other Ambulatory Visit: Payer: Self-pay | Admitting: Urology

## 2020-08-20 ENCOUNTER — Other Ambulatory Visit: Payer: Self-pay

## 2020-08-20 ENCOUNTER — Encounter: Payer: Self-pay | Admitting: Urology

## 2020-08-20 ENCOUNTER — Ambulatory Visit (INDEPENDENT_AMBULATORY_CARE_PROVIDER_SITE_OTHER): Payer: Medicare Other | Admitting: Urology

## 2020-08-20 VITALS — BP 135/79 | HR 65 | Temp 98.0°F | Ht 72.0 in | Wt 212.0 lb

## 2020-08-20 DIAGNOSIS — Z8546 Personal history of malignant neoplasm of prostate: Secondary | ICD-10-CM

## 2020-08-20 DIAGNOSIS — N138 Other obstructive and reflux uropathy: Secondary | ICD-10-CM | POA: Diagnosis not present

## 2020-08-20 DIAGNOSIS — R350 Frequency of micturition: Secondary | ICD-10-CM | POA: Diagnosis not present

## 2020-08-20 DIAGNOSIS — E291 Testicular hypofunction: Secondary | ICD-10-CM | POA: Diagnosis not present

## 2020-08-20 DIAGNOSIS — N401 Enlarged prostate with lower urinary tract symptoms: Secondary | ICD-10-CM | POA: Diagnosis not present

## 2020-08-20 DIAGNOSIS — N5201 Erectile dysfunction due to arterial insufficiency: Secondary | ICD-10-CM

## 2020-08-20 LAB — URINALYSIS, ROUTINE W REFLEX MICROSCOPIC
Bilirubin, UA: NEGATIVE
Ketones, UA: NEGATIVE
Leukocytes,UA: NEGATIVE
Nitrite, UA: NEGATIVE
Protein,UA: NEGATIVE
RBC, UA: NEGATIVE
Specific Gravity, UA: 1.01 (ref 1.005–1.030)
Urobilinogen, Ur: 0.2 mg/dL (ref 0.2–1.0)
pH, UA: 6 (ref 5.0–7.5)

## 2020-08-20 MED ORDER — SILDENAFIL CITRATE 20 MG PO TABS: ORAL_TABLET | ORAL | 11 refills | Status: DC

## 2020-08-20 NOTE — Progress Notes (Signed)
Subjective:  1. BPH with urinary obstruction   2. History of prostate cancer   3. Hypogonadism in male   4. Urinary frequency   5. Erectile dysfunction due to arterial insufficiency     I have prostate cancer. HPI: Glen Guerrero is a 75 year-old male established patient who is here evaluation for treatment of prostate cancer.  His prostate cancer was diagnosed 01/04/2019. He does have the pathology report from his biopsy. His PSA at his time of diagnosis was 3.7.   08/20/20.  Glen Guerrero returns today for the history noted below.  His PSA prior to this visit with Dr. Nevada Crane remains <0.1.  He doesn't have a testosterone result yet.  He hot flashes have improved but he still has fatigue.  He is voiding better with a decline in the IPSS to 7 with some frequency but minimal nocturia.  He is down to 1 tamsulosin daily.  I refilled it yesterday.  He tried stopping it about 3 months ago and had to go back on.  He has no GI complaints or hematuria.  He does have some chronic myalgias.  He is recovering his libido but has ED.   02/21/20: Glen Guerrero returns today for his history of prostate cancer.   His PSA from 10/27 is not back yet. It was <0.1 and the testosterone was castrate at <10 in 3/21.   He has persistent fatigue and hot flashes.  He has been feeling better for the last 2 weeks.   He continues to have nocturia 3-4x and he has some intermittency.  He is on tamsulosin 2 daily.   He has no hematuria.  He has no GI complaints.  His weight is stable.     05/31/19: Glen Guerrero returns today in f/u for his prostate cancer.  He was started on Firmagon 240mg  on 02/22/19 and he had Eligard 30mg  SQ on 03/26/19 and on 08/09/19.    He completed radiation in mid February 2021.  He remains on tamsulosin.   His nocturia has improved and is down to 2-3x.    He has no further  pain with voiding or hesitancy.   He does better if he walks around for a bit before voiding.      He has been seen at the [facility] for his T2a Gleason  7(4+3) prostate cancer. He has elected to proceed with EXRT with adjuvanct ADT for 6-8 months. He will be getting his EXRT at [facility] and I will await their input regarding the need for [employer] and fiducials. He was initially seen for BPH with BOO and was found to have an 9mm right base nodule with a PSA of 3.7 which was from 10/19 prior to the initiation of finasteride which he is currently taking. He had a biopsy that demonstrated a 31ml prostate with a T2a N0 M0 Gleason 7(4+3) prostate cancer. The right base and mid lateral cores had Gleason 7(4+3) up to 90% involvement. The remaining 4 right cores had 7(3+4) disease up to 85% involvement. 3/6 left cores had low volume Gleason 6 disease. The bone scan is negative and a CT showed some small subpleural nodules favored to be nodes but no other evidence of metastatic disease. His comorbidities include HTN, hyperlipidemia, and diabetes. His voiding symptoms have improved since his initial visit.   [facility] nomogram predicts. 28% OCD, 69% ECE, 19% LNI and 20% SVI.    IPSS    Row Name 08/20/20 1300  International Prostate Symptom Score   How often have you had the sensation of not emptying your bladder? Less than 1 in 5     How often have you had to urinate less than every two hours? About half the time     How often have you found you stopped and started again several times when you urinated? Less than 1 in 5 times     How often have you found it difficult to postpone urination? Not at All     How often have you had a weak urinary stream? Less than 1 in 5 times     How often have you had to strain to start urination? Not at All     How many times did you typically get up at night to urinate? 1 Time     Total IPSS Score 7           Quality of Life due to urinary symptoms   If you were to spend the rest of your life with your urinary condition just the way it is now how would you feel about that? Pleased             ROS:  ROS:   A complete review of systems was performed.  All systems are negative except for pertinent findings as noted.     Allergies  Allergen Reactions  . Mirapex  [Pramipexole Dihydrochloride] Other (See Comments)  . Other Other (See Comments)  . Pramipexole Other (See Comments)  . Ciprofloxacin Nausea Only    Lightheaded  . Flagyl [Metronidazole] Other (See Comments)    Can't remember symptoms  . Ibuprofen   . Tramadol Nausea And Vomiting    Can't take any medication similar to Tramadol    Outpatient Encounter Medications as of 08/20/2020  Medication Sig  . amLODipine (NORVASC) 10 MG tablet Take 10 mg by mouth daily.  Marland Kitchen ascorbic acid (VITAMIN C) 1000 MG tablet Take by mouth.  Marland Kitchen aspirin EC 81 MG tablet Take 81 mg by mouth daily.  Marland Kitchen atorvastatin (LIPITOR) 20 MG tablet Take 20 mg by mouth daily.  . calcium carbonate (OSCAL) 1500 (600 Ca) MG TABS tablet Take by mouth 2 (two) times daily with a meal.  . CHERRY PO Take 100 mg by mouth 2 (two) times daily.  . Coenzyme Q10 200 MG capsule Take by mouth.  . Continuous Blood Gluc Sensor (FREESTYLE LIBRE 14 DAY SENSOR) MISC Apply topically.  Marland Kitchen ezetimibe (ZETIA) 10 MG tablet Take 10 mg by mouth daily.  . fenofibrate micronized (LOFIBRA) 134 MG capsule Take by mouth daily.  . fexofenadine (ALLEGRA) 180 MG tablet Take 180 mg by mouth daily.  . fluorouracil (EFUDEX) 5 % cream Apply 1 application topically as needed.   . fluticasone (FLONASE) 50 MCG/ACT nasal spray Place 1 spray into both nostrils daily.  Marland Kitchen gabapentin (NEURONTIN) 300 MG capsule Take 1 capsule by mouth daily.  Marland Kitchen glipiZIDE (GLUCOTROL) 10 MG tablet Take 10 mg by mouth daily before breakfast.   . glucose blood test strip OneTouch Ultra Blue Test Strip  USE 1 STRIP VIA METER THREE TIMES A DAY DX: E11.9  . Insulin Pen Needle 31G X 5 MM MISC BD Ultra-Fine Mini Pen Needle 31 gauge x 3/16"  . JARDIANCE 25 MG TABS tablet Take 25 mg by mouth daily.  Marland Kitchen LEVEMIR FLEXTOUCH 100 UNIT/ML Pen Inject  30 Units into the skin 2 (two) times daily.  Marland Kitchen loratadine (CLARITIN) 10 MG tablet Take 10 mg by  mouth daily as needed for allergies.  . Melatonin 10 MG TABS Take 1 tablet by mouth at bedtime as needed.  . NON FORMULARY Uses CPAP nightly  . NOVOLOG FLEXPEN 100 UNIT/ML FlexPen Inject 8 Units into the skin 3 (three) times daily with meals.  Glory Rosebush Delica Lancets 22G MISC OneTouch Delica Lancets 30 gauge  USE TO TEST 3 TIMES A DAY  . POTASSIUM CHLORIDE ER PO Take 99 mg by mouth daily at 6 (six) AM.  . rOPINIRole (REQUIP) 1 MG tablet Take 1 mg by mouth 3 (three) times daily.  . tamsulosin (FLOMAX) 0.4 MG CAPS capsule TAKE 1 CAPSULE BY MOUTH EVERY DAY  . triamcinolone cream (KENALOG) 0.1 % triamcinolone acetonide 0.1 % topical cream  APPLY TO ITCHY SPOTS ON BACK  . valsartan (DIOVAN) 80 MG tablet Take 80 mg by mouth daily.  Marland Kitchen zolpidem (AMBIEN) 5 MG tablet Take 5 mg by mouth at bedtime as needed.  . sildenafil (REVATIO) 20 MG tablet 1-5 po prn 1 hour prior to activity, start with 2-3.   No facility-administered encounter medications on file as of 08/20/2020.    Past Medical History:  Diagnosis Date  . Arthritis   . Carpal tunnel syndrome   . Diverticulitis   . DM type 2 (diabetes mellitus, type 2) ([place])   . Gout   . History of colon polyps    2015 polyp x 1 dysplastic and adenomatous pieces.  . Hypercholesterolemia   . Hypertension   . Insomnia   . Prostate cancer ([place])   . Prostate enlargement   . Restless leg syndrome   . Seasonal allergies   . Skin cancer     Past Surgical History:  Procedure Laterality Date  . APPENDECTOMY    . COLONOSCOPY  2015   Dr.Spainhour- polypectomy x 1 pieces dysplastic and adenomatous; Recc repeat in 2020 (5 years).  . COLONOSCOPY N/A 12/05/2017   Procedure: COLONOSCOPY;  Surgeon: Daneil Dolin, MD;  Location: [facility];  Service: Endoscopy;  Laterality: N/A;  11:15am  . HERNIA REPAIR     inguinal x2  . POLYPECTOMY  12/05/2017   Procedure:  POLYPECTOMY;  Surgeon: Daneil Dolin, MD;  Location: [facility];  Service: Endoscopy;;  colon   . TONSILLECTOMY      Social History   Socioeconomic History  . Marital status: Married    Spouse name: Katharine Look  . Number of children: 3  . Years of education: Not on file  . Highest education level: Not on file  Occupational History  . Not on file  Tobacco Use  . Smoking status: Current Some Day Smoker    Types: Cigars    Start date: 04/12/1969  . Smokeless tobacco: Former Systems developer    Quit date: 05/20/1979  . Tobacco comment: 5 cigars per week  Vaping Use  . Vaping Use: Never used  Substance and Sexual Activity  . Alcohol use: Yes    Alcohol/week: 0.0 standard drinks    Comment: wine once a week; occ whiskey  . Drug use: No  . Sexual activity: Yes  Other Topics Concern  . 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 Hypertension Mother   . Heart failure Mother   . Prostate cancer Father   . Hypertension Child   . Hypertension Child   . Hypertension Child   . High Cholesterol Child   . High Cholesterol Child   . High Cholesterol Child   . Cancer Paternal Aunt        unknown type  . Cancer Paternal Aunt        unknown type  . Colon cancer Neg Hx        Objective: Vitals:   08/20/20 1311  BP: 135/79  Pulse: 65  Temp: 98 F (36.7 C)     Physical Exam Vitals reviewed.  Constitutional:      Appearance: Normal appearance.  Genitourinary:    Comments: AP without lesions. NST without mass. Prostate smooth and flat. SV non-palpable.   Neurological:     Mental Status: He is alert.     Lab Results:  No results found for this or any previous visit (from the past 24 hour(s)).   BMET No results for input(s): NA, K, CL, CO2, GLUCOSE, BUN, CREATININE, CALCIUM in the last 72 hours. PSA PSA  Date Value Ref Range Status  07/16/2019 <0.1 < OR = 4.0 ng/mL Final    Comment:    The total PSA value from this assay system is  standardized against the WHO standard. The test  result will be approximately 20% lower when compared  to the equimolar-standardized total PSA (Beckman  Coulter). Comparison of serial PSA results should be  interpreted with this fact in mind. . This test was performed using the Siemens  chemiluminescent method. Values obtained from  different assay methods cannot be used interchangeably. PSA levels, regardless of value, should not be interpreted as absolute evidence of the presence or absence of disease.    Testosterone  Date Value Ref Range Status  07/16/2019 <10 (L) 250 - 827 ng/dL Final    Comment:    In hypogonadal males, Testosterone, Total, LC/MS/MS, is the recommended assay due to the diminished accuracy of immunoassay at levels below 250 ng/dL. This test code [phone]) must be collected in a red-top tube with no gel.      He had a PSA done with Dr. Nevada Crane as noted above.   UA is clear.   Studies/Results: .      Assessment & Plan: Prostate cancer.  His PSA is undetectible.  I will get a testosterone today and get a PSA and testosterone in 6 months.   BPH with BOO and Nocturia.  He is voiding better on tamsulosin daily and he will try to wean off of it again.  ED.  He is recovering his libido and is interested in therapy.   I will try him on sildenafil and reviewed the instructions.   Side effects and precautions reviewed.   .     Meds ordered this encounter  Medications  . sildenafil (REVATIO) 20 MG tablet    Sig: 1-5 po prn 1 hour prior to activity, start with 2-3.     Dispense:  30 tablet    Refill:  11     Orders Placed This Encounter  Procedures  . Urinalysis, Routine w reflex microscopic  . Testosterone  . PSA    Standing Status:   Future    Standing Expiration Date:  08/20/2021  . Testosterone    Standing Status:   Future    Standing Expiration Date:   08/20/2021      Return in about 6 months (around 02/19/2021) for with PSA and testosterone. .   CC: Celene Squibb, MD      Irine Seal 08/20/2020 Patient ID: Wannetta Sender., male   DOB: 20-Jan-1946, 75 y.o.   MRN: [id number]

## 2020-08-20 NOTE — Progress Notes (Signed)
Urological Symptom Review  Patient is experiencing the following symptoms: Get up at night to urinate   Review of Systems  Gastrointestinal (upper)  : Negative for upper GI symptoms  Gastrointestinal (lower) : Negative for lower GI symptoms  Constitutional : Fatigue  Skin: Negative for skin symptoms  Eyes: Negative for eye symptoms  Ear/Nose/Throat : Negative for Ear/Nose/Throat symptoms  Hematologic/Lymphatic: Negative for Hematologic/Lymphatic symptoms  Cardiovascular : Negative for cardiovascular symptoms  Respiratory : Negative for respiratory symptoms  Endocrine: Negative for endocrine symptoms  Musculoskeletal: Negative for musculoskeletal symptoms  Neurological: Negative for neurological symptoms  Psychologic: Negative for psychiatric symptoms  

## 2020-08-21 LAB — TESTOSTERONE: Testosterone: 96 ng/dL — ABNORMAL LOW (ref 264–916)

## 2020-08-21 LAB — PSA: Prostate Specific Ag, Serum: <0.1 ng/mL (ref 0.0–4.0)

## 2020-08-24 NOTE — Progress Notes (Signed)
Message sent through Mychart

## 2020-09-08 IMAGING — CT CT CHEST W/O CM
2 of 4 series · 15 of 36 positions shown, 18 images · non-contrast
Comparison: 01/30/2019

CLINICAL DATA: Evaluate right base pulmonary nodule. History of
prostate cancer.

EXAM:
CT CHEST WITHOUT CONTRAST
TECHNIQUE: Multidetector CT imaging of the chest was performed following the
standard protocol without IV contrast.

[Series 2: routine chest without · axial · non-contrast · 0.77mm/px · z∈[+1100,+1388]mm · 12 of 172 slices shown, 15 images]
[im 14/172  mediastinal]
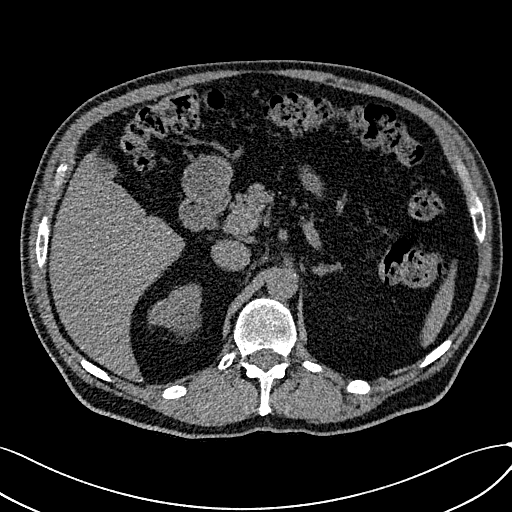
[im 14/172  lung]
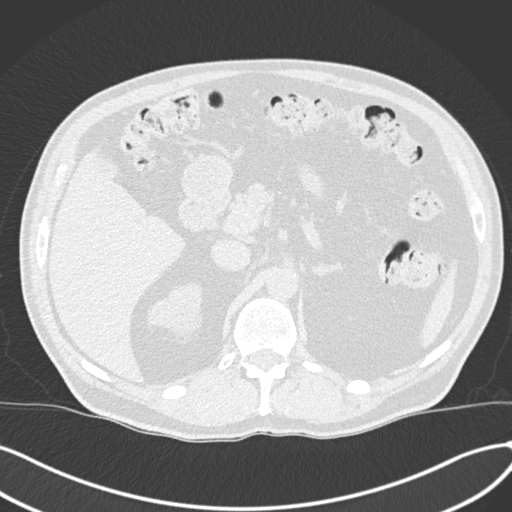
[im 27/172  lung]
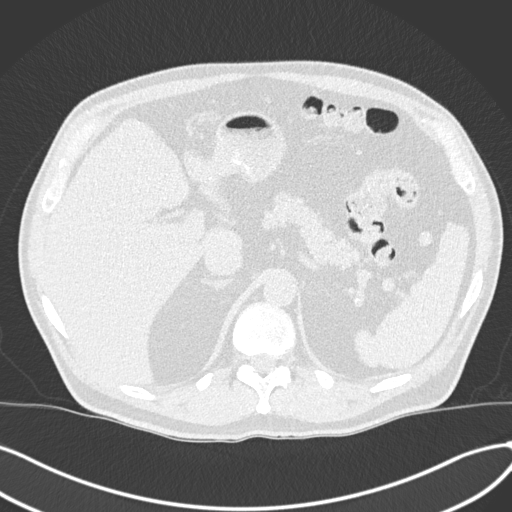
[im 40/172  lung]
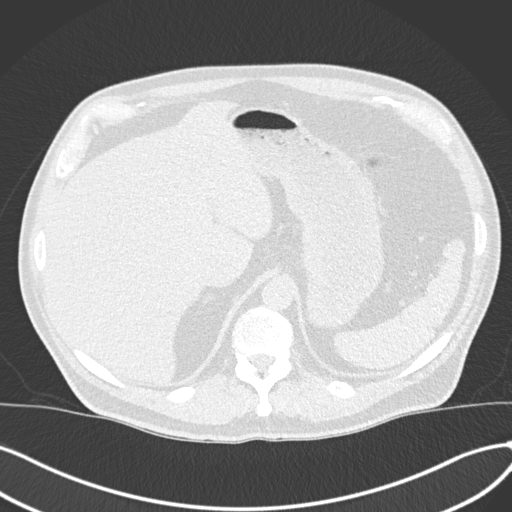
[im 53/172  lung]
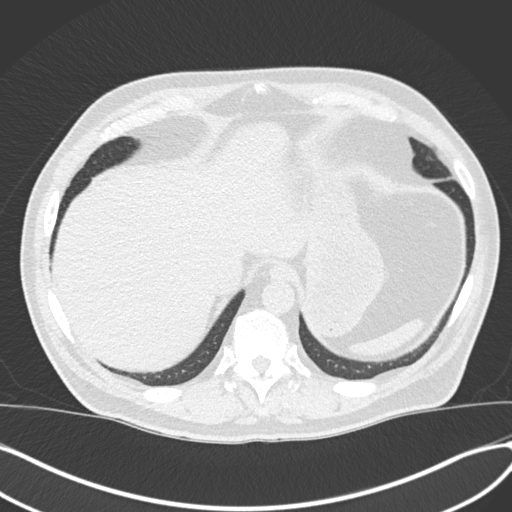
[im 66/172  mediastinal]
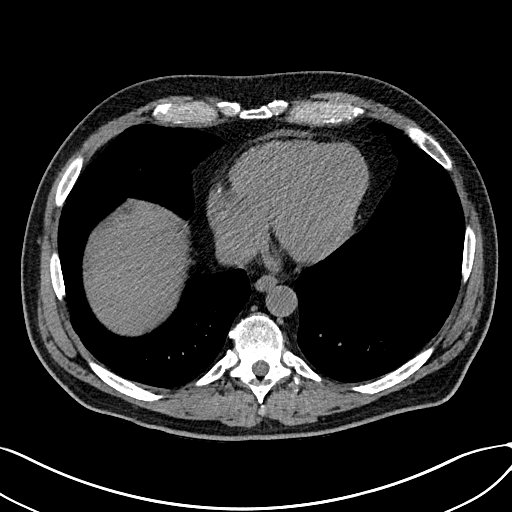
[im 66/172  lung]
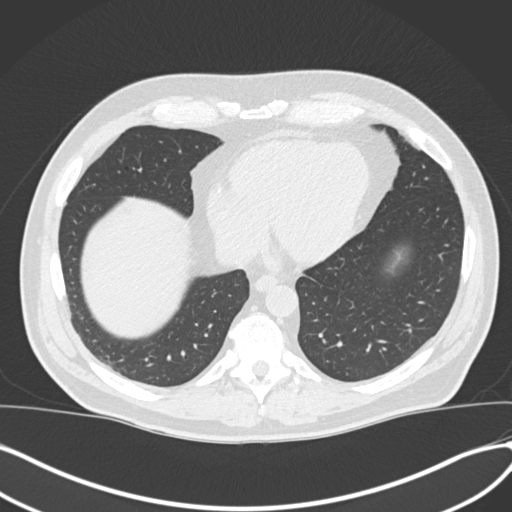
[im 79/172  lung]
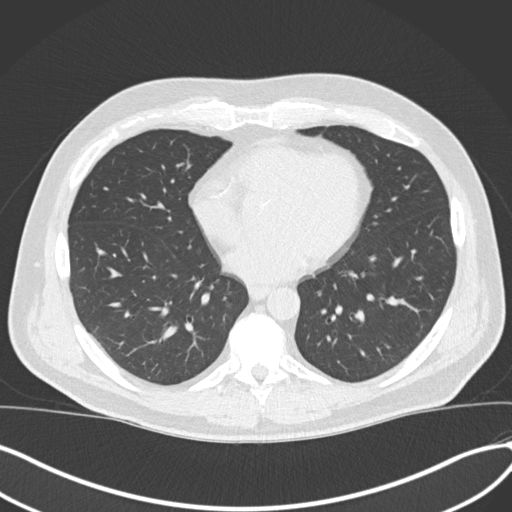
[im 93/172  lung]
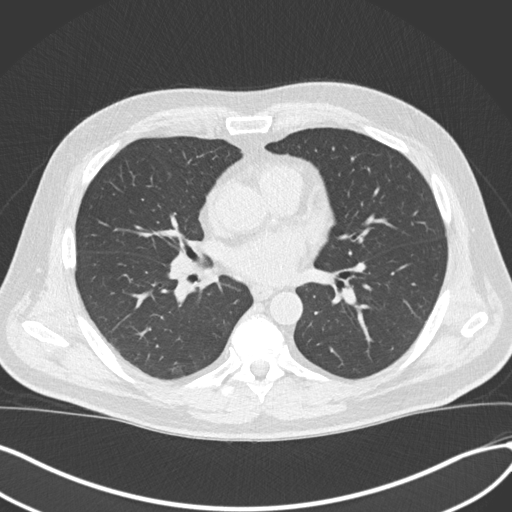
[im 106/172  lung]
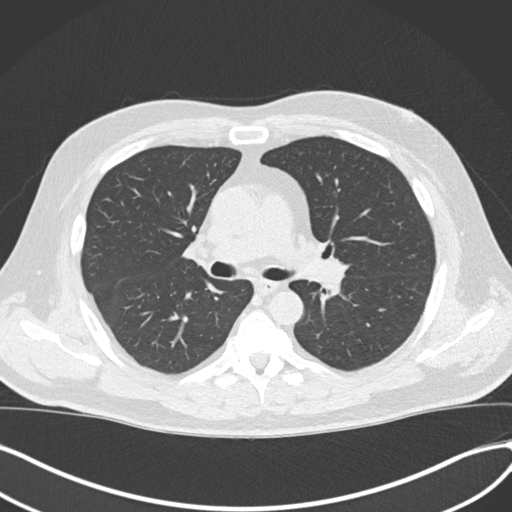
[im 119/172  mediastinal]
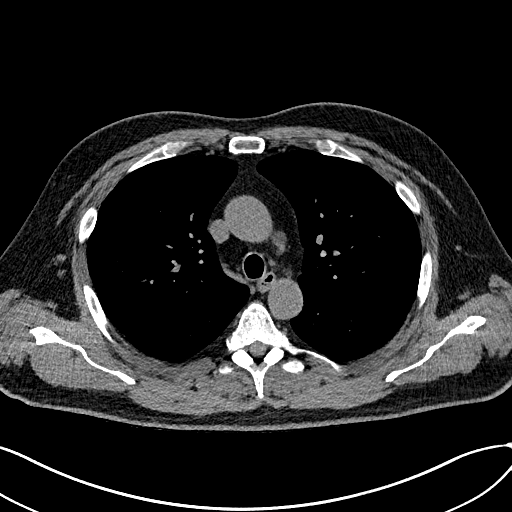
[im 119/172  lung]
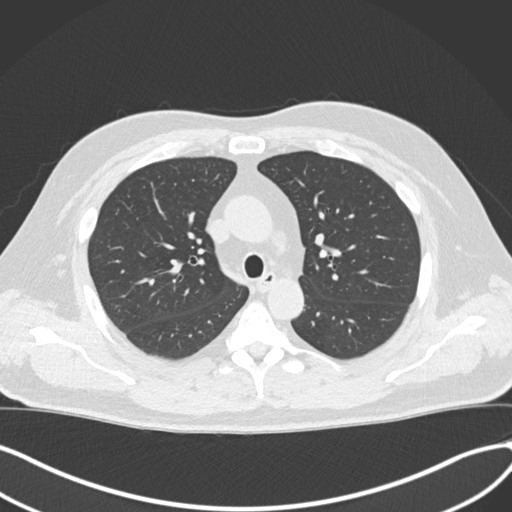
[im 132/172  lung]
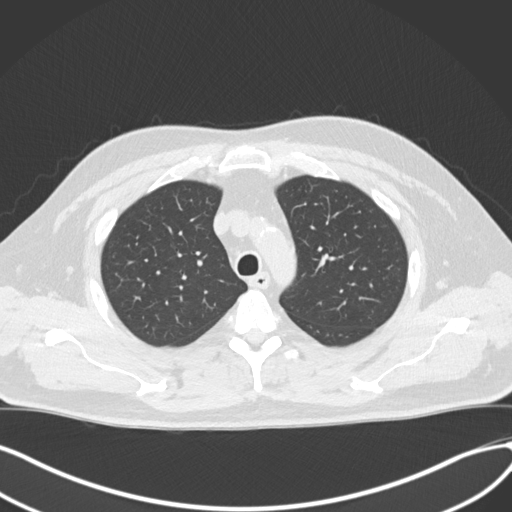
[im 145/172  lung]
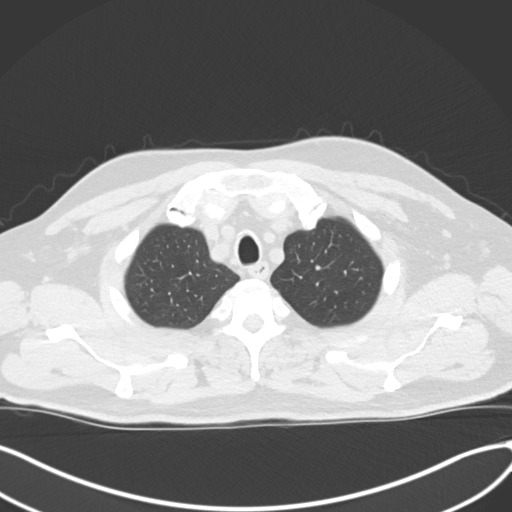
[im 158/172  lung]
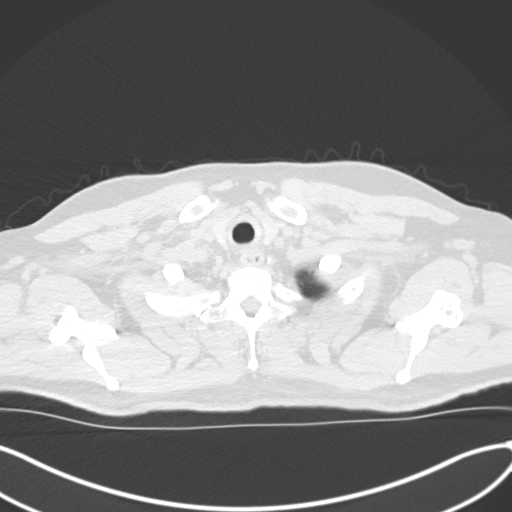

[Series 5: coronal · coronal · 0.67mm/px · 3 of 151 slices shown]
[im 31/151  lung]
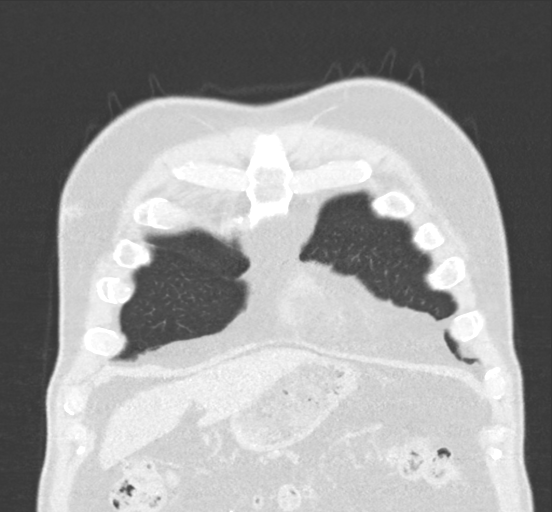
[im 61/151  lung]
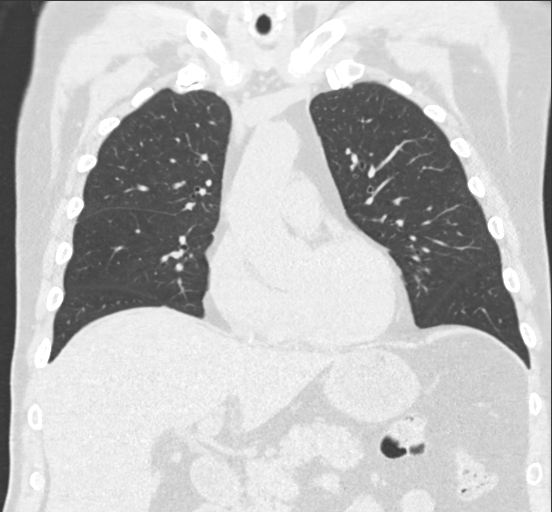
[im 91/151  lung]
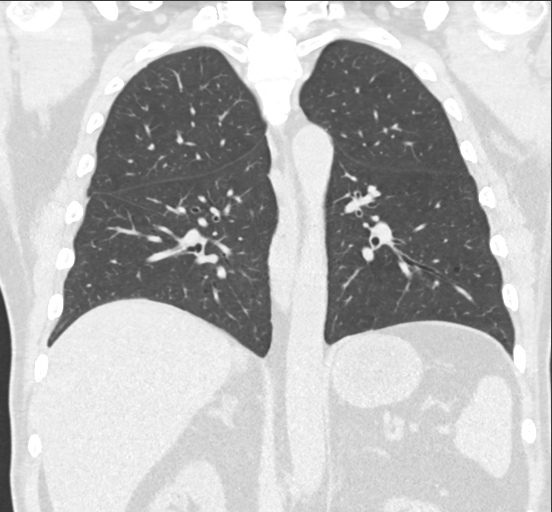

[15 of 36 positions shown; findings below may reference images not displayed]

FINDINGS: Cardiovascular: Normal heart size. Aortic atherosclerosis. Lad and
RCA coronary artery atherosclerotic calcifications.

Mediastinum/Nodes: Normal appearance of the thyroid gland. The
trachea appears patent and is midline. Normal appearance of the
esophagus. No mediastinal or hilar adenopathy.

Lungs/Pleura: No pleural effusion, airspace consolidation, or
atelectasis.Previously noted tiny right lung base subpleural nodules
the are no longer visualized. No suspicious lung nodules identified
at this time.

Upper Abdomen: No acute abnormality. Right upper pole cyst measures
3.5 cm. Incompletely characterized without IV contrast.

Musculoskeletal: No chest wall mass or suspicious bone lesions
identified.
IMPRESSION: 1. No active cardiopulmonary abnormalities.
2. Previously noted tiny right lung base subpleural nodules are no
longer visualized. No suspicious lung nodules identified at this
time.
3. Aortic atherosclerosis. Coronary artery calcifications noted.

Aortic Atherosclerosis ([id number].A).

## 2020-11-24 ENCOUNTER — Other Ambulatory Visit: Payer: Self-pay

## 2020-11-24 ENCOUNTER — Encounter: Payer: Self-pay | Admitting: Urology

## 2020-11-24 DIAGNOSIS — N5201 Erectile dysfunction due to arterial insufficiency: Secondary | ICD-10-CM

## 2020-11-24 MED ORDER — TADALAFIL 5 MG PO TABS: 5.0000 mg | ORAL_TABLET | Freq: Every day | ORAL | 0 refills | Status: DC | PRN

## 2020-11-24 NOTE — Telephone Encounter (Signed)
Please advise 

## 2021-01-11 ENCOUNTER — Other Ambulatory Visit: Payer: Self-pay

## 2021-01-11 DIAGNOSIS — C61 Malignant neoplasm of prostate: Secondary | ICD-10-CM

## 2021-02-03 ENCOUNTER — Encounter: Payer: Self-pay | Admitting: Internal Medicine

## 2021-02-06 LAB — TESTOSTERONE: Testosterone: 85 ng/dL — ABNORMAL LOW (ref 264–916)

## 2021-02-06 LAB — PSA: Prostate Specific Ag, Serum: 0.2 ng/mL (ref 0.0–4.0)

## 2021-02-25 ENCOUNTER — Encounter: Payer: Self-pay | Admitting: Urology

## 2021-02-25 ENCOUNTER — Other Ambulatory Visit: Payer: Self-pay

## 2021-02-25 ENCOUNTER — Ambulatory Visit (INDEPENDENT_AMBULATORY_CARE_PROVIDER_SITE_OTHER): Payer: Medicare Other | Admitting: Urology

## 2021-02-25 VITALS — BP 133/73 | HR 62 | Temp 98.5°F | Wt 218.4 lb

## 2021-02-25 DIAGNOSIS — N138 Other obstructive and reflux uropathy: Secondary | ICD-10-CM

## 2021-02-25 DIAGNOSIS — N5201 Erectile dysfunction due to arterial insufficiency: Secondary | ICD-10-CM

## 2021-02-25 DIAGNOSIS — E291 Testicular hypofunction: Secondary | ICD-10-CM | POA: Diagnosis not present

## 2021-02-25 DIAGNOSIS — R351 Nocturia: Secondary | ICD-10-CM

## 2021-02-25 DIAGNOSIS — Z8546 Personal history of malignant neoplasm of prostate: Secondary | ICD-10-CM

## 2021-02-25 DIAGNOSIS — R9721 Rising PSA following treatment for malignant neoplasm of prostate: Secondary | ICD-10-CM

## 2021-02-25 DIAGNOSIS — N401 Enlarged prostate with lower urinary tract symptoms: Secondary | ICD-10-CM

## 2021-02-25 LAB — URINALYSIS, ROUTINE W REFLEX MICROSCOPIC
Bilirubin, UA: NEGATIVE
Ketones, UA: NEGATIVE
Leukocytes,UA: NEGATIVE
Nitrite, UA: NEGATIVE
RBC, UA: NEGATIVE
Specific Gravity, UA: 1.01 (ref 1.005–1.030)
Urobilinogen, Ur: 0.2 mg/dL (ref 0.2–1.0)
pH, UA: 5 (ref 5.0–7.5)

## 2021-02-25 NOTE — Progress Notes (Signed)
Urological Symptom Review  Patient is experiencing the following symptoms: Get up at night to urinate Stream starts and stops Erection problems (male only)   Review of Systems  Gastrointestinal (upper)  : Negative for upper GI symptoms  Gastrointestinal (lower) : Negative for lower GI symptoms  Constitutional : Night Sweats Fatigue  Skin: Negative for skin symptoms  Eyes: Negative for eye symptoms  Ear/Nose/Throat : Negative for Ear/Nose/Throat symptoms  Hematologic/Lymphatic: Easy bruising  Cardiovascular : Negative for cardiovascular symptoms  Respiratory : Negative for respiratory symptoms  Endocrine: Negative for endocrine symptoms  Musculoskeletal: Back pain Joint pain  Neurological: Negative for neurological symptoms  Psychologic: Negative for psychiatric symptoms

## 2021-02-25 NOTE — Progress Notes (Signed)
Subjective:  1. History of prostate cancer   2. Rising PSA following treatment for malignant neoplasm of prostate   3. Hypogonadism in male   4. Erectile dysfunction due to arterial insufficiency   5. BPH with urinary obstruction   6. Nocturia     I have prostate cancer. HPI: Glen Guerrero is a 74 year-old male established patient who is here evaluation for treatment of prostate cancer.  His prostate cancer was diagnosed 01/04/2019. He does have the pathology report from his biopsy. His PSA at his time of diagnosis was 3.7.   02/25/21: Glen Guerrero returns for the history noted below.  His PSA on 02/05/21 was 0.2 which is up from <0.1 in 4/22.  The testosterone remains low at 85.  He last had Eligard 30mg  on 08/09/19.  He completed EXRT in 2/21.  He had T2a GG3 prostate cancer on his pretreatment biopsy.  He is voiding wel but has nocturia x 2 and some intermittency.  His IPSS is 5.  He is taking ambien for sleep issues related to restless leg.  He has lost a couple of lbs.  He has no GI complaints but he had a SBO 5 weeks ago that resolved with conservative management.  He has ED and didn't have a good response to PDE5's.   He had headaches with it.   He is not having hot flashes but he has malaise in the afternoon and has chronic myalgias.    He has been seen at the [facility] for his T2a Gleason 7(4+3) prostate cancer. He has elected to proceed with EXRT with adjuvanct ADT for 6-8 months. He will be getting his EXRT at [facility] and I will await their input regarding the need for [employer] and fiducials. He was initially seen for BPH with BOO and was found to have an 42mm right base nodule with a PSA of 3.7 which was from 10/19 prior to the initiation of finasteride which he is currently taking. He had a biopsy that demonstrated a 10ml prostate with a T2a N0 M0 Gleason 7(4+3) prostate cancer. The right base and mid lateral cores had Gleason 7(4+3) up to 90% involvement. The remaining 4 right cores had  7(3+4) disease up to 85% involvement. 3/6 left cores had low volume Gleason 6 disease. The bone scan is negative and a CT showed some small subpleural nodules favored to be nodes but no other evidence of metastatic disease. His comorbidities include HTN, hyperlipidemia, and diabetes. His voiding symptoms have improved since his initial visit.   MS[facility] nomogram predicts. 28% OCD, 69% ECE, 19% LNI and 20% SVI.    IPSS     Row Name 02/25/21 1300         International Prostate Symptom Score   How often have you had the sensation of not emptying your bladder? Less than 1 in 5     How often have you had to urinate less than every two hours? Less than 1 in 5 times     How often have you found you stopped and started again several times when you urinated? Less than 1 in 5 times     How often have you found it difficult to postpone urination? Not at All     How often have you had a weak urinary stream? Not at All     How often have you had to strain to start urination? Not at All     How many times did you typically get up at  night to urinate? 2 Times     Total IPSS Score 5       Quality of Life due to urinary symptoms   If you were to spend the rest of your life with your urinary condition just the way it is now how would you feel about that? Mostly Satisfied                ROS:  ROS:  A complete review of systems was performed.  All systems are negative except for pertinent findings as noted.     Allergies  Allergen Reactions   Mirapex  [Pramipexole Dihydrochloride] Other (See Comments)   Other Other (See Comments)   Pramipexole Other (See Comments)   Ciprofloxacin Nausea Only    Lightheaded   Flagyl [Metronidazole] Other (See Comments)    Can't remember symptoms   Ibuprofen    Tramadol Nausea And Vomiting    Can't take any medication similar to Tramadol    Outpatient Encounter Medications as of 02/25/2021  Medication Sig   Gabapentin Enacarbil 600 MG TBCR Take 1 tablet  by mouth daily.   amLODipine (NORVASC) 10 MG tablet Take 10 mg by mouth daily.   ascorbic acid (VITAMIN C) 1000 MG tablet Take by mouth.   aspirin EC 81 MG tablet Take 81 mg by mouth daily.   atorvastatin (LIPITOR) 20 MG tablet Take 20 mg by mouth daily.   calcium carbonate (OSCAL) 1500 (600 Ca) MG TABS tablet Take by mouth 2 (two) times daily with a meal.   CHERRY PO Take 100 mg by mouth 2 (two) times daily.   Coenzyme Q10 200 MG capsule Take by mouth.   Continuous Blood Gluc Sensor (FREESTYLE LIBRE 14 DAY SENSOR) MISC Apply topically.   ezetimibe (ZETIA) 10 MG tablet Take 10 mg by mouth daily.   fenofibrate micronized (LOFIBRA) 134 MG capsule Take by mouth daily.   fexofenadine (ALLEGRA) 180 MG tablet Take 180 mg by mouth daily.   fluorouracil (EFUDEX) 5 % cream Apply 1 application topically as needed.    fluticasone (FLONASE) 50 MCG/ACT nasal spray Place 1 spray into both nostrils daily.   glipiZIDE (GLUCOTROL) 10 MG tablet Take 10 mg by mouth daily before breakfast.    glucose blood test strip OneTouch Ultra Blue Test Strip  USE 1 STRIP VIA METER THREE TIMES A DAY DX: E11.9   Insulin Pen Needle 31G X 5 MM MISC BD Ultra-Fine Mini Pen Needle 31 gauge x 3/16"   JARDIANCE 25 MG TABS tablet Take 25 mg by mouth daily.   LEVEMIR FLEXTOUCH 100 UNIT/ML Pen Inject 30 Units into the skin 2 (two) times daily.   loratadine (CLARITIN) 10 MG tablet Take 10 mg by mouth daily as needed for allergies.   Melatonin 10 MG TABS Take 1 tablet by mouth at bedtime as needed.   NON FORMULARY Uses CPAP nightly   NOVOLOG FLEXPEN 100 UNIT/ML FlexPen Inject 8 Units into the skin 3 (three) times daily with meals.   OneTouch Delica Lancets 43X MISC OneTouch Delica Lancets 30 gauge  USE TO TEST 3 TIMES A DAY   POTASSIUM CHLORIDE ER PO Take 99 mg by mouth daily at 6 (six) AM.   rOPINIRole (REQUIP) 1 MG tablet Take 1 mg by mouth 3 (three) times daily.   triamcinolone cream (KENALOG) 0.1 % triamcinolone acetonide 0.1 %  topical cream  APPLY TO ITCHY SPOTS ON BACK   valsartan (DIOVAN) 80 MG tablet Take 80 mg by mouth daily.  zolpidem (AMBIEN) 5 MG tablet Take 5 mg by mouth at bedtime as needed.   [DISCONTINUED] gabapentin (NEURONTIN) 300 MG capsule Take 1 capsule by mouth daily.   [DISCONTINUED] sildenafil (REVATIO) 20 MG tablet 1-5 po prn 1 hour prior to activity, start with 2-3.   [DISCONTINUED] tadalafil (CIALIS) 5 MG tablet Take 1 tablet (5 mg total) by mouth daily as needed for erectile dysfunction. Take 2-4 tablets by mouth as needed daily   [DISCONTINUED] tamsulosin (FLOMAX) 0.4 MG CAPS capsule TAKE 1 CAPSULE BY MOUTH EVERY DAY   No facility-administered encounter medications on file as of 02/25/2021.    Past Medical History:  Diagnosis Date   Arthritis    Carpal tunnel syndrome    Diverticulitis    DM type 2 (diabetes mellitus, type 2) (HCC)    Gout    History of colon polyps    2015 polyp x 1 dysplastic and adenomatous pieces.   Hypercholesterolemia    Hypertension    Insomnia    Prostate cancer ([place])    Prostate enlargement    Restless leg syndrome    Seasonal allergies    Skin cancer     Past Surgical History:  Procedure Laterality Date   APPENDECTOMY     COLONOSCOPY  2015   Dr.Spainhour- polypectomy x 1 pieces dysplastic and adenomatous; Recc repeat in 2020 (5 years).   COLONOSCOPY N/A 12/05/2017   Procedure: COLONOSCOPY;  Surgeon: Daneil Dolin, MD;  [facility];  Service: Endoscopy;  Laterality: N/A;  11:15am   HERNIA REPAIR     inguinal x2   POLYPECTOMY  12/05/2017   Procedure: POLYPECTOMY;  Surgeon: Daneil Dolin, MD;  Location: [facility];  Service: Endoscopy;;  colon    TONSILLECTOMY      Social History   Socioeconomic History   Marital status: Married    Spouse name: Katharine Look   Number of children: 3   Years of education: Not on file   Highest education level: Not on file  Occupational History   Not on file  Tobacco Use   Smoking status: Some  Days    Types: Cigars    Start date: 04/12/1969   Smokeless tobacco: Former    Quit date: 05/20/1979   Tobacco comments:    5 cigars per week  Vaping Use   Vaping Use: Never used  Substance and Sexual Activity   Alcohol use: Yes    Alcohol/week: 0.0 standard drinks    Comment: wine once a week; occ whiskey   Drug use: No   Sexual activity: Yes  Other Topics Concern   Not on file  Social History Narrative   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   Three children: Revonda Humphrey and Lattie Haw   Social Determinants of Radio broadcast assistant Strain: Not on [employer] Insecurity: Not on file  Transportation Needs: Not on file  Physical Activity: Not on file  Stress: Not on file  Social Connections: Not on file  Intimate Partner Violence: Not on file    Family History  Problem Relation Age of Onset   Hypertension Mother    Heart failure Mother    Prostate cancer Father    Hypertension Child    Hypertension Child    Hypertension Child    High Cholesterol Child    High Cholesterol Child    High Cholesterol Child    Cancer Paternal Aunt        unknown  type   Cancer Paternal Aunt        unknown type   Colon cancer Neg Hx        Objective: Vitals:   02/25/21 1307  BP: 133/73  Pulse: 62  Temp: 98.5 F (36.9 C)     Physical Exam  Lab Results:  Results for orders placed or performed in visit on 02/25/21 (from the past 24 hour(s))  Urinalysis, Routine w reflex microscopic     Status: Abnormal   Collection Time: 02/25/21  1:17 PM  Result Value Ref Range   Specific Gravity, UA 1.010 1.005 - 1.030   pH, UA 5.0 5.0 - 7.5   Color, UA Yellow Yellow   Appearance Ur Clear Clear   Leukocytes,UA Negative Negative   Protein,UA Trace (A) Negative/Trace   Glucose, UA 3+ (A) Negative   Ketones, UA Negative Negative   RBC, UA Negative Negative   Bilirubin, UA Negative Negative   Urobilinogen, Ur 0.2 0.2 - 1.0 mg/dL   Nitrite, UA Negative Negative    Microscopic Examination Comment    Narrative   Performed at:  [facility] [address] [place], [place]  [id number] Lab Director: Mina Marble MT, Phone:  [phone]     BMET No results for input(s): NA, K, CL, CO2, GLUCOSE, BUN, CREATININE, CALCIUM in the last 72 hours. PSA PSA  Date Value Ref Range Status  07/16/2019 <0.1 < OR = 4.0 ng/mL Final    Comment:    The total PSA value from this assay system is  standardized against the WHO standard. The test  result will be approximately 20% lower when compared  to the equimolar-standardized total PSA (Beckman  Coulter). Comparison of serial PSA results should be  interpreted with this fact in mind. . This test was performed using the Siemens  chemiluminescent method. Values obtained from  different assay methods cannot be used interchangeably. PSA levels, regardless of value, should not be interpreted as absolute evidence of the presence or absence of disease.    Testosterone  Date Value Ref Range Status  02/05/2021 85 (L) 264 - 916 ng/dL Final    Comment:    Adult male reference interval is based on a population of healthy nonobese males (BMI <30) between 15 and 13 years old. [place], [place] [phone]. PMID: [id number].   08/20/2020 96 (L) 264 - 916 ng/dL Final    Comment:    Adult male reference interval is based on a population of healthy nonobese males (BMI <30) between 16 and 53 years old. [place], [place] [phone]. PMID: [id number].   07/16/2019 <10 (L) 250 - 827 ng/dL Final    Comment:    In hypogonadal males, Testosterone, Total, LC/MS/MS, is the recommended assay due to the diminished accuracy of immunoassay at levels below 250 ng/dL. This test code [id number]) must be collected in a red-top tube with no gel.      Lab Results  Component Value Date   PSA1 0.2 02/05/2021     UA is clear.   Studies/Results: .      Assessment & Plan: Prostate cancer.  His  PSA is up from the prior level with a persistently low but not castrate testosterone.  The level could be a post RTx bump, but I will repeat in 3 months and then in 6 months and will restage as needed.    BPH with BOO and Nocturia.  He is voiding well off of the tamsulosin.   ED.  He  had side effects and no response with sildenafil.   I discussed the VED, injections and IPP.  He is not ready to pursue other options at this time.   .     No orders of the defined types were placed in this encounter.    Orders Placed This Encounter  Procedures   Urinalysis, Routine w reflex microscopic   PSA    Standing Status:   Future    Standing Expiration Date:   08/25/2021   Testosterone    Standing Status:   Future    Standing Expiration Date:   08/25/2021   PSA   Testosterone    Standing Status:   Future    Standing Expiration Date:   02/25/2022      Return in about 6 months (around 08/25/2021) for Labs in 3 months and then f/u in 6 with labs again. .   CC: Celene Squibb, MD      Irine Seal 02/26/2021 Patient ID: Wannetta Sender., male   DOB: 1945-12-22, 75 y.o.   MRN: [id number] Patient ID: Kymere Fullington., male   DOB: 24-Dec-1945, 75 y.o.   MRN: [id number]

## 2021-03-02 ENCOUNTER — Other Ambulatory Visit: Payer: Self-pay

## 2021-03-02 ENCOUNTER — Encounter: Payer: Self-pay | Admitting: Internal Medicine

## 2021-03-02 ENCOUNTER — Ambulatory Visit (INDEPENDENT_AMBULATORY_CARE_PROVIDER_SITE_OTHER): Payer: Medicare Other | Admitting: Internal Medicine

## 2021-03-02 VITALS — BP 122/77 | HR 60 | Temp 97.1°F | Ht 72.0 in | Wt 212.6 lb

## 2021-03-02 DIAGNOSIS — K56609 Unspecified intestinal obstruction, unspecified as to partial versus complete obstruction: Secondary | ICD-10-CM

## 2021-03-02 DIAGNOSIS — K642 Third degree hemorrhoids: Secondary | ICD-10-CM

## 2021-03-02 DIAGNOSIS — R1013 Epigastric pain: Secondary | ICD-10-CM

## 2021-03-02 DIAGNOSIS — K641 Second degree hemorrhoids: Secondary | ICD-10-CM

## 2021-03-02 NOTE — Progress Notes (Signed)
Primary Care Physician:  Celene Squibb, MD Primary Gastroenterologist:  Dr. Gala Romney  Pre-Procedure History & Physical: HPI:  Glen Guerrero. is a 75 y.o. male here for further evaluation of acute illness due to a small bowel obstruction while he was on vacation in [place] month before last.  He developed acute onset periumbilical epigastric pain.  Went to emergency department where evaluation included lab work and contrast CT scan which revealed a high-grade small bowel obstruction with a transition point right lower quadrant.  He was admitted to the hospital.  NG tube placed.  He was seen by surgery.  Obstruction resolved within 24 hours.  He was noted to have a slightly elevated AST of 49 on admission; lipase was 258.  Pancreas appeared entirely normal on CT.  White count and hemoglobin were normal. Prior abdominal surgeries include open appendectomy many years ago.  Since his hospitalization.  He states he has been doing well he has been able to eat.  He is having normal bowel function without blood per rectum or constipation.  He does note he has had recurrent issues with hygiene related to known hemorrhoids.  Previously banded at this office with good results until recently.  History of small colonic adenoma removed from his colon 2019; due for surveillance examination 2017.   Also, has had polyps removed at prior colonoscopies elsewhere.  Since I last saw him, he was diagnosed with prostate cancer.  He has received radiation treatment for that condition.  Past Medical History:  Diagnosis Date   Arthritis    Carpal tunnel syndrome    Diverticulitis    DM type 2 (diabetes mellitus, type 2) (HCC)    Gout    History of colon polyps    2015 polyp x 1 dysplastic and adenomatous pieces.   Hypercholesterolemia    Hypertension    Insomnia    Prostate cancer ([place])    Prostate enlargement    Restless leg syndrome    Seasonal allergies    Skin cancer     Past Surgical History:   Procedure Laterality Date   APPENDECTOMY     COLONOSCOPY  2015   Dr.Spainhour- polypectomy x 1 pieces dysplastic and adenomatous; Recc repeat in 2020 (5 years).   COLONOSCOPY N/A 12/05/2017   Procedure: COLONOSCOPY;  Surgeon: Daneil Dolin, MD;  [facility];  Service: Endoscopy;  Laterality: N/A;  11:15am   HERNIA REPAIR     inguinal x2   POLYPECTOMY  12/05/2017   Procedure: POLYPECTOMY;  Surgeon: Daneil Dolin, MD;  [facility];  Service: Endoscopy;;  colon    TONSILLECTOMY      Prior to Admission medications   Medication Sig Start Date End Date Taking? Authorizing Provider  amLODipine (NORVASC) 10 MG tablet Take 10 mg by mouth daily.   Yes [provider]  ascorbic acid (VITAMIN C) 1000 MG tablet Take by mouth.   Yes [provider]  aspirin EC 81 MG tablet Take 81 mg by mouth daily.   Yes [provider]  atorvastatin (LIPITOR) 20 MG tablet Take 20 mg by mouth daily.   Yes [provider]  calcium carbonate (OSCAL) 1500 (600 Ca) MG TABS tablet Take by mouth 2 (two) times daily with a meal.   Yes [provider]  CHERRY PO Take 100 mg by mouth 2 (two) times daily.   Yes [provider]  ezetimibe (ZETIA) 10 MG tablet Take 10 mg by  mouth daily.   Yes [provider]  fenofibrate micronized (LOFIBRA) 134 MG capsule Take by mouth daily. 01/29/19  Yes [provider]  fexofenadine (ALLEGRA) 180 MG tablet Take 180 mg by mouth daily.   Yes [provider]  fluorouracil (EFUDEX) 5 % cream Apply 1 application topically as needed.    Yes [provider]  fluticasone (FLONASE) 50 MCG/ACT nasal spray Place 1 spray into both nostrils daily.   Yes [provider]  Gabapentin Enacarbil ER 300 MG TBCR Take 1 tablet by mouth daily.   Yes [provider]  glipiZIDE (GLUCOTROL) 10 MG tablet Take 10 mg by mouth daily before breakfast.    Yes [provider]   JARDIANCE 25 MG TABS tablet Take 25 mg by mouth daily. 08/19/17  Yes [provider]  LEVEMIR FLEXTOUCH 100 UNIT/ML Pen Inject 30 Units into the skin 2 (two) times daily. 01/30/19  Yes [provider]  Multiple Vitamins-Minerals (ZINC PO) Take by mouth daily.   Yes [provider]  NON FORMULARY Uses CPAP nightly   Yes [provider]  Omega-3 Fatty Acids (FISH OIL PO) Take by mouth.   Yes [provider]  POTASSIUM CHLORIDE ER PO Take 99 mg by mouth daily at 6 (six) AM.   Yes [provider]  rOPINIRole (REQUIP) 1 MG tablet Take 1 mg by mouth 3 (three) times daily.   Yes [provider]  triamcinolone cream (KENALOG) 0.1 % triamcinolone acetonide 0.1 % topical cream  APPLY TO ITCHY SPOTS ON BACK 11/11/19  Yes [provider]  valsartan (DIOVAN) 80 MG tablet Take 80 mg by mouth daily. 02/18/19  Yes [provider]  VITAMIN D PO Take by mouth daily.   Yes [provider]  zolpidem (AMBIEN) 5 MG tablet Take 5 mg by mouth at bedtime as needed.   Yes [provider]  Coenzyme Q10 200 MG capsule Take by mouth.    [provider]  Melatonin 10 MG TABS Take 1 tablet by mouth at bedtime as needed.    [provider]    Allergies as of 03/02/2021 - Review Complete 03/02/2021  Allergen Reaction Noted   Mirapex  [pramipexole dihydrochloride] Other (See Comments) 02/12/2019   Other Other (See Comments) 02/12/2019   Pramipexole Other (See Comments) 02/26/2019   Ciprofloxacin Nausea Only 05/19/2014   Flagyl [metronidazole] Other (See Comments) 05/19/2014   Ibuprofen  05/31/2019   Tramadol Nausea And Vomiting 10/24/2017    Family History  Problem Relation Age of Onset   Hypertension Mother    Heart failure Mother    Prostate cancer Father    Hypertension Child    Hypertension Child    Hypertension Child    High Cholesterol Child    High Cholesterol Child    High Cholesterol Child     Cancer Paternal Aunt        unknown type   Cancer Paternal Aunt        unknown type   Colon cancer Neg Hx     Social History   Socioeconomic History   Marital status: Married    Spouse name: Glen Guerrero   Number of children: 3   Years of education: Not on file   Highest education level: Not on file  Occupational History   Not on file  Tobacco Use   Smoking status: Some Days    Types: Cigars    Start date: 04/12/1969   Smokeless tobacco: Former  Quit date: 05/20/1979   Tobacco comments:    5 cigars per week  Vaping Use   Vaping Use: Never used  Substance and Sexual Activity   Alcohol use: Yes    Alcohol/week: 0.0 standard drinks    Comment: wine once a week; occ whiskey   Drug use: No   Sexual activity: Yes  Other Topics Concern   Not on file  Social History Narrative   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  Accompanied by his wife. Neck:  Supple; no masses or thyromegaly. No significant cervical adenopathy. Lungs:  Clear throughout to auscultation.   No wheezes, crackles, or rhonchi. No acute distress. Heart:  Regular rate and rhythm; no murmurs, clicks, rubs,  or gallops. Abdomen: Nondistended.  A generous vertical,  right lower quadrant surgical scar present.   Positive bowel sounds.  Soft and nontender without appreciable mass organomegaly or fascial defect. Pulses:   Normal pulses noted. Extremities:  Without clubbing or edema.  Impression/Plan: 75 year old gentleman presents with a recent history of well-documented small bowel obstruction which resolved with conservative treatment.  History of open appendectomy many years ago with a generous right lower quadrant scar.  Etiology of SBO not clear although adhesive disease is likely.  No evidence of tumor or other process to account for mechanical obstruction on recent cross-sectional imaging. Recent elevated serum lipase in context of acute small bowel obstruction and normal-appearing pancreatic parenchyma is nonspecific and not consistent with pancreatitis.  Mild elevation AST-not likely significant  History of colonic adenoma; due for surveillance colonoscopy 2026.  Some hygiene issues likely related to hemorrhoids.  Recommendations:  Proceed with CT enterography to further evaluate the cause of recent small bowel obstruction-we will schedule at [facility]  Repeat serum serum lipase and hepatic function profile  Information on hemorrhoids provided.  At this time, no need to change timing of follow-up colonoscopy which is currently scheduled for 2026  Further recommendations to follow after a CTE results, labs available for review     Notice: This dictation was prepared with Dragon dictation along with smaller phrase technology. Any transcriptional errors that result from this process are unintentional and may not be corrected upon review.

## 2021-03-02 NOTE — Patient Instructions (Signed)
It was good to see you again today!  Lets proceed with CT enterography to further evaluate you as to the cause of your recent small bowel obstruction-we will schedule at [facility]  Serum lipase and hepatic function profile  Information on hemorrhoids provided.  At this time, no need to change timing of follow-up colonoscopy which is currently scheduled for 2026   Further recommendations to follow after a CTE results are available for review

## 2021-03-29 ENCOUNTER — Other Ambulatory Visit: Payer: Self-pay | Admitting: Internal Medicine

## 2021-03-30 LAB — HEPATIC FUNCTION PANEL
ALT: 28 IU/L (ref 0–44)
AST: 24 IU/L (ref 0–40)
Albumin: 4.5 g/dL (ref 3.7–4.7)
Alkaline Phosphatase: 61 IU/L (ref 44–121)
Bilirubin Total: 0.3 mg/dL (ref 0.0–1.2)
Bilirubin, Direct: 0.12 mg/dL (ref 0.00–0.40)
Total Protein: 6.3 g/dL (ref 6.0–8.5)

## 2021-03-30 LAB — CREATININE, SERUM
Creatinine, Ser: 1.72 mg/dL — ABNORMAL HIGH (ref 0.76–1.27)
eGFR: 42 mL/min/{1.73_m2} — ABNORMAL LOW (ref >59)

## 2021-03-30 LAB — LIPASE: Lipase: 99 U/L — ABNORMAL HIGH (ref 13–78)

## 2021-04-01 ENCOUNTER — Ambulatory Visit (HOSPITAL_COMMUNITY)
Admission: RE | Admit: 2021-04-01 | Discharge: 2021-04-01 | Disposition: A | Discharge status: Home / Self Care | Payer: Medicare Other | Source: Ambulatory Visit | Attending: Internal Medicine | Admitting: Internal Medicine

## 2021-04-01 ENCOUNTER — Telehealth: Payer: Self-pay | Admitting: Internal Medicine

## 2021-04-01 ENCOUNTER — Other Ambulatory Visit: Payer: Self-pay

## 2021-04-01 DIAGNOSIS — K56609 Unspecified intestinal obstruction, unspecified as to partial versus complete obstruction: Secondary | ICD-10-CM | POA: Diagnosis present

## 2021-04-01 LAB — POCT I-STAT CREATININE: Creatinine, Ser: 1.7 mg/dL — ABNORMAL HIGH (ref 0.61–1.24)

## 2021-04-01 MED ORDER — IOHEXOL 300 MG/ML  SOLN
100.0000 mL | Freq: Once | INTRAMUSCULAR | Status: AC | PRN
  Administered 2021-04-01: 100 mL via INTRAVENOUS

## 2021-04-01 NOTE — Telephone Encounter (Signed)
Spoke with pt and informed her that the pt's lab results was going to be sent to the pt's PCP and to Dr. Theador Hawthorne.

## 2021-04-01 NOTE — Telephone Encounter (Signed)
PLEASE CALL PATIENT WIFE, SHE HAS SOME QUESTIONS ABOUT HIS LABS

## 2021-04-05 ENCOUNTER — Encounter: Payer: Self-pay | Admitting: Internal Medicine

## 2021-05-14 ENCOUNTER — Other Ambulatory Visit (HOSPITAL_COMMUNITY): Payer: Self-pay | Admitting: Nephrology

## 2021-05-14 ENCOUNTER — Other Ambulatory Visit: Payer: Self-pay | Admitting: Nephrology

## 2021-05-14 DIAGNOSIS — E1122 Type 2 diabetes mellitus with diabetic chronic kidney disease: Secondary | ICD-10-CM

## 2021-05-14 DIAGNOSIS — N170 Acute kidney failure with tubular necrosis: Secondary | ICD-10-CM

## 2021-05-14 DIAGNOSIS — E1129 Type 2 diabetes mellitus with other diabetic kidney complication: Secondary | ICD-10-CM

## 2021-05-14 DIAGNOSIS — E559 Vitamin D deficiency, unspecified: Secondary | ICD-10-CM

## 2021-05-14 DIAGNOSIS — I129 Hypertensive chronic kidney disease with stage 1 through stage 4 chronic kidney disease, or unspecified chronic kidney disease: Secondary | ICD-10-CM

## 2021-05-21 ENCOUNTER — Other Ambulatory Visit: Payer: Self-pay

## 2021-05-21 ENCOUNTER — Ambulatory Visit (HOSPITAL_COMMUNITY)
Admission: RE | Admit: 2021-05-21 | Discharge: 2021-05-21 | Disposition: A | Discharge status: Home / Self Care | Payer: Medicare Other | Source: Ambulatory Visit | Attending: Nephrology | Admitting: Nephrology

## 2021-05-21 DIAGNOSIS — E1122 Type 2 diabetes mellitus with diabetic chronic kidney disease: Secondary | ICD-10-CM | POA: Insufficient documentation

## 2021-05-21 DIAGNOSIS — N170 Acute kidney failure with tubular necrosis: Secondary | ICD-10-CM | POA: Diagnosis not present

## 2021-05-21 DIAGNOSIS — E559 Vitamin D deficiency, unspecified: Secondary | ICD-10-CM | POA: Diagnosis present

## 2021-05-21 DIAGNOSIS — I129 Hypertensive chronic kidney disease with stage 1 through stage 4 chronic kidney disease, or unspecified chronic kidney disease: Secondary | ICD-10-CM | POA: Insufficient documentation

## 2021-05-21 DIAGNOSIS — R809 Proteinuria, unspecified: Secondary | ICD-10-CM | POA: Diagnosis present

## 2021-05-21 DIAGNOSIS — E1129 Type 2 diabetes mellitus with other diabetic kidney complication: Secondary | ICD-10-CM | POA: Insufficient documentation

## 2021-05-26 ENCOUNTER — Other Ambulatory Visit: Payer: Self-pay | Admitting: Urology

## 2021-05-27 LAB — PSA: Prostate Specific Ag, Serum: 0.2 ng/mL (ref 0.0–4.0)

## 2021-06-10 ENCOUNTER — Other Ambulatory Visit (HOSPITAL_COMMUNITY): Payer: Self-pay | Admitting: Family Medicine

## 2021-06-10 ENCOUNTER — Other Ambulatory Visit: Payer: Self-pay | Admitting: Family Medicine

## 2021-06-10 DIAGNOSIS — M79605 Pain in left leg: Secondary | ICD-10-CM

## 2021-06-10 DIAGNOSIS — M79604 Pain in right leg: Secondary | ICD-10-CM

## 2021-06-11 ENCOUNTER — Encounter: Payer: Self-pay | Admitting: Cardiology

## 2021-06-18 ENCOUNTER — Ambulatory Visit (HOSPITAL_COMMUNITY)
Admission: RE | Admit: 2021-06-18 | Discharge: 2021-06-18 | Disposition: A | Discharge status: Home / Self Care | Payer: Medicare Other | Source: Ambulatory Visit | Attending: Family Medicine | Admitting: Family Medicine

## 2021-06-18 ENCOUNTER — Other Ambulatory Visit: Payer: Self-pay

## 2021-06-18 DIAGNOSIS — M79605 Pain in left leg: Secondary | ICD-10-CM

## 2021-06-18 DIAGNOSIS — M79604 Pain in right leg: Secondary | ICD-10-CM

## 2021-07-01 ENCOUNTER — Telehealth: Payer: Self-pay | Admitting: "Endocrinology

## 2021-07-01 NOTE — Telephone Encounter (Signed)
Patient was referred from [place] for his diabetes. Patient spoke with his Primary, Dr Nevada Crane and he told him that he could cancel the appt and close the referral ?

## 2021-07-12 ENCOUNTER — Encounter: Payer: Self-pay | Admitting: Cardiology

## 2021-07-12 ENCOUNTER — Other Ambulatory Visit: Payer: Self-pay | Admitting: Cardiology

## 2021-07-12 ENCOUNTER — Telehealth: Payer: Self-pay | Admitting: Cardiology

## 2021-07-12 ENCOUNTER — Ambulatory Visit (INDEPENDENT_AMBULATORY_CARE_PROVIDER_SITE_OTHER): Payer: Medicare Other

## 2021-07-12 ENCOUNTER — Encounter: Payer: Self-pay | Admitting: *Deleted

## 2021-07-12 ENCOUNTER — Other Ambulatory Visit: Payer: Self-pay

## 2021-07-12 ENCOUNTER — Ambulatory Visit (INDEPENDENT_AMBULATORY_CARE_PROVIDER_SITE_OTHER): Payer: Medicare Other | Admitting: Cardiology

## 2021-07-12 VITALS — BP 138/78 | HR 64 | Ht 72.0 in | Wt 216.0 lb

## 2021-07-12 DIAGNOSIS — I10 Essential (primary) hypertension: Secondary | ICD-10-CM | POA: Diagnosis not present

## 2021-07-12 DIAGNOSIS — R0609 Other forms of dyspnea: Secondary | ICD-10-CM

## 2021-07-12 DIAGNOSIS — R42 Dizziness and giddiness: Secondary | ICD-10-CM

## 2021-07-12 DIAGNOSIS — R5383 Other fatigue: Secondary | ICD-10-CM

## 2021-07-12 NOTE — Telephone Encounter (Signed)
Checking percert on the following patient for testing scheduled at [facility].   ? ? ? ?ECHO    07/28/2021 ? ?7 Day ZIO XT/Branch ?

## 2021-07-12 NOTE — Progress Notes (Signed)
? ? ? ?Clinical Summary ?Glen Guerrero is a 76 y.o.male ? ?1. Sinus bradycardia, 1st degree AV block ?- mild fatigue at times, nonspecific ?- no dizziness, no passing out. ?  ? - we previously had stopped his beta blocker, HRs normalized at that time ? ? ? ? ?2. 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  ?- hghest exertion is walking dogs, few hundred yards.  ?- nonspecific chest discomfort right chest or left chest. Spasm like feeling, just a second or two ?- no cough, mild wheezing. Former cigar smoker ?- has had some recent LE edema.  ? ? ?3. Fatigue ?- good day and bad days as far as energy goes ?- feels drained, no energy at all. Lays down to rest.  ?- slight dizziness at times,  no syncope.  ?- he is gabapenting but fatigue prior ? ? ? ?4. OSA ?- uses CPAP and is compliant  ?  ?5. Prostate cancer ?  ?Past Medical History:  ?Diagnosis Date  ? Arthritis   ? Carpal tunnel syndrome   ? Diverticulitis   ? DM type 2 (diabetes mellitus, type 2) ([place])   ? Gout   ? History of colon polyps   ? 2015 polyp x 1 dysplastic and adenomatous pieces.  ? Hypercholesterolemia   ? Hypertension   ? Insomnia   ? Prostate cancer ([place])   ? Prostate enlargement   ? Restless leg syndrome   ? Seasonal allergies   ? Skin cancer   ? ? ? ?Allergies  ?Allergen Reactions  ? Mirapex  [Pramipexole Dihydrochloride] Other (See Comments)  ? Other Other (See Comments)  ? Pramipexole Other (See Comments)  ? Ciprofloxacin Nausea Only  ?  Lightheaded  ? Flagyl [Metronidazole] Other (See Comments)  ?  Can't remember symptoms  ? Ibuprofen   ? Tramadol Nausea And Vomiting  ?  Can't take any medication similar to Tramadol  ? ? ? ?Current Outpatient Medications  ?Medication Sig Dispense Refill  ? amLODipine (NORVASC) 10 MG tablet Take 10 mg by mouth daily.    ? ascorbic acid (VITAMIN C) 1000 MG tablet Take by mouth.    ? aspirin EC 81 MG tablet Take 81 mg by mouth daily.    ? atorvastatin (LIPITOR) 20 MG tablet Take 20 mg by  mouth daily.    ? calcium carbonate (OSCAL) 1500 (600 Ca) MG TABS tablet Take by mouth 2 (two) times daily with a meal.    ? CHERRY PO Take 100 mg by mouth 2 (two) times daily.    ? Coenzyme Q10 200 MG capsule Take by mouth.    ? ezetimibe (ZETIA) 10 MG tablet Take 10 mg by mouth daily.    ? fenofibrate micronized (LOFIBRA) 134 MG capsule Take by mouth daily.    ? fexofenadine (ALLEGRA) 180 MG tablet Take 180 mg by mouth daily.    ? fluorouracil (EFUDEX) 5 % cream Apply 1 application topically as needed.     ? fluticasone (FLONASE) 50 MCG/ACT nasal spray Place 1 spray into both nostrils daily.    ? Gabapentin Enacarbil ER 300 MG TBCR Take 1 tablet by mouth daily.    ? glipiZIDE (GLUCOTROL) 10 MG tablet Take 10 mg by mouth daily before breakfast.     ? JARDIANCE 25 MG TABS tablet Take 25 mg by mouth daily.  1  ? LEVEMIR FLEXTOUCH 100 UNIT/ML Pen Inject 30 Units into the skin 2 (two) times daily.    ?  Melatonin 10 MG TABS Take 1 tablet by mouth at bedtime as needed.    ? Multiple Vitamins-Minerals (ZINC PO) Take by mouth daily.    ? NON FORMULARY Uses CPAP nightly    ? Omega-3 Fatty Acids (FISH OIL PO) Take by mouth.    ? POTASSIUM CHLORIDE ER PO Take 99 mg by mouth daily at 6 (six) AM.    ? rOPINIRole (REQUIP) 1 MG tablet Take 1 mg by mouth 3 (three) times daily.    ? triamcinolone cream (KENALOG) 0.1 % triamcinolone acetonide 0.1 % topical cream ? APPLY TO ITCHY SPOTS ON BACK    ? valsartan (DIOVAN) 80 MG tablet Take 80 mg by mouth daily.    ? VITAMIN D PO Take by mouth daily.    ? zolpidem (AMBIEN) 5 MG tablet Take 5 mg by mouth at bedtime as needed.    ? ?No current facility-administered medications for this visit.  ? ? ? ?Past Surgical History:  ?Procedure Laterality Date  ? APPENDECTOMY    ? COLONOSCOPY  2015  ? Dr.Spainhour- polypectomy x 1 pieces dysplastic and adenomatous; Recc repeat in 2020 (5 years).  ? COLONOSCOPY N/A 12/05/2017  ? Procedure: COLONOSCOPY;  Surgeon: Daneil Dolin, MD;  [facility];  Service: Endoscopy;  Laterality: N/A;  11:15am  ? HERNIA REPAIR    ? inguinal x2  ? POLYPECTOMY  12/05/2017  ? Procedure: POLYPECTOMY;  Surgeon: Daneil Dolin, MD;  [facility];  Service: Endoscopy;;  colon ?  ? TONSILLECTOMY    ? ? ? ?Allergies  ?Allergen Reactions  ? Mirapex  [Pramipexole Dihydrochloride] Other (See Comments)  ? Other Other (See Comments)  ? Pramipexole Other (See Comments)  ? Ciprofloxacin Nausea Only  ?  Lightheaded  ? Flagyl [Metronidazole] Other (See Comments)  ?  Can't remember symptoms  ? Ibuprofen   ? Tramadol Nausea And Vomiting  ?  Can't take any medication similar to Tramadol  ? ? ? ? ?Family History  ?Problem Relation Age of Onset  ? Hypertension Mother   ? Heart failure Mother   ? Prostate cancer Father   ? Hypertension Child   ? Hypertension Child   ? Hypertension Child   ? High Cholesterol Child   ? High Cholesterol Child   ? High Cholesterol Child   ? Cancer Paternal Aunt   ?     unknown type  ? Cancer Paternal Aunt   ?     unknown type  ? Colon cancer Neg Hx   ? ? ? ?Social History ?Glen Guerrero reports that he has been smoking cigars. He started smoking about 52 years ago. He quit smokeless tobacco use about 42 years ago. ?Glen Guerrero reports current alcohol use. ? ? ?Review of Systems ?CONSTITUTIONAL: No weight loss, fever, chills, weakness or fatigue.  ?HEENT: Eyes: No visual loss, blurred vision, double vision or yellow sclerae.No hearing loss, sneezing, congestion, runny nose or sore throat.  ?SKIN: No rash or itching.  ?CARDIOVASCULAR: per hpi ?RESPIRATORY: No shortness of breath, cough or sputum.  ?GASTROINTESTINAL: No anorexia, nausea, vomiting or diarrhea. No abdominal pain or blood.  ?GENITOURINARY: No burning on urination, no polyuria ?NEUROLOGICAL: No headache, dizziness, syncope, paralysis, ataxia, numbness or tingling in the extremities. No change in bowel or bladder control.  ?MUSCULOSKELETAL: No muscle, back pain, joint pain or stiffness.   ?LYMPHATICS: No enlarged nodes. No history of splenectomy.  ?PSYCHIATRIC: No history of depression or anxiety.  ?ENDOCRINOLOGIC: No reports  of sweating, cold or heat intolerance. No polyuria or polydipsia.  ?. ? ? ?Physical Examination ?Today's Vitals  ? 07/12/21 0909  ?BP: 138/78  ?Pulse: 64  ?SpO2: 96%  ?Weight: 216 lb (98 kg)  ?Height: 6' (1.829 m)  ? ?Body mass index is 29.29 kg/m?. ? ?Gen: resting comfortably, no acute distress ?HEENT: no scleral icterus, pupils equal round and reactive, no palptable cervical adenopathy,  ?CV: RRR, no m/r/g no jvd ?Resp: Clear to auscultation bilaterally ?GI: abdomen is soft, non-tender, non-distended, normal bowel sounds, no hepatosplenomegaly ?MSK: extremities are warm, no edema.  ?Skin: warm, no rash ?Neuro:  no focal deficits ?Psych: appropriate affect ? ? ?Diagnostic Studies ? ? ? ? ?Assessment and Plan  ?1. DOE ?- unclear etiology ?- obtain echo to further evaluate. Symptoms not consistent with ischemia, at this time would not plan for ischemic testing. If normal echo would plan for PFTs ? ?2. Fatigue ?-nonspecific generalized fatigue at times, unclear etiology ?- prior issues with bradycardia resolved off beta blocker. EKG SR with first degree av block, unclear if could be having some recurrent episodic brady episodes. Obtain 7 day monitor ? ?F/u 4 months ? ? ? ? ? ?Arnoldo Lenis, M.D. ?

## 2021-07-12 NOTE — Patient Instructions (Addendum)
Medication Instructions:  ?Your physician recommends that you continue on your current medications as directed. Please refer to the Current Medication list given to you today. ? ?Labwork: ?none ? ?Testing/Procedures: ?Your physician has requested that you have an echocardiogram. Echocardiography is a painless test that uses sound waves to create images of your heart. It provides your doctor with information about the size and shape of your heart and how well your heart?s chambers and valves are working. This procedure takes approximately one hour. There are no restrictions for this procedure. ?ZIO- Long Term Monitor Instructions  ? ?Your physician has requested you wear your ZIO patch monitor 7 days.  ? ?This is a single patch monitor.  [employer] supplies one patch monitor per enrollment.  Additional stickers are not available. ?  ?Please do not apply patch if you will be having a Nuclear Stress Test, Echocardiogram, Cardiac CT, MRI, or Chest Xray during the time frame you would be wearing the monitor. The patch cannot be worn during these tests.  You cannot remove and re-apply the ZIO XT patch monitor. ?  ?  ?Once you have received you monitor, please review enclosed instructions.  Your monitor has already been registered assigning a specific monitor serial # to you. ? ? ?Applying the monitor  ? ?Shave hair from upper left chest. ?  ?Hold abrader disc by orange tab.  Rub abrader in 40 strokes over left upper chest as indicated in your monitor instructions. ?  ?Clean area with 4 enclosed alcohol pads .  Use all pads to assure are is cleaned thoroughly.  Let dry.  ? ?Apply patch as indicated in monitor instructions.  Patch will be place under collarbone on left side of chest with arrow pointing upward. ?  ?Rub patch adhesive wings for 2 minutes.Remove white label marked "1".  Remove white label marked "2".  Rub patch adhesive wings for 2 additional minutes. ?  ?While looking in a mirror, press and release button in  center of patch.  A small green light will flash 3-4 times .  This will be your only indicator the monitor has been turned on. ?    ?Do not shower for the first 24 hours.  You may shower after the first 24 hours. ?  ?Press button if you feel a symptom. You will hear a small click.  Record Date, Time and Symptom in the Patient Log Book. ?  ?When you are ready to remove patch, follow instructions on last 2 pages of Patient Log Book.  Stick patch monitor onto last page of Patient Log Book. ?  ?Place Patient Log Book in [facility] box.  Use locking tab on box and tape box closed securely.  The [employer] has [employer] on it.  Please place in mailbox as soon as possible.  Your physician should have your test results approximately 7 days after the monitor has been mailed back to [facility]. ?  ?Call [facility] at [phone] if you have questions regarding your ZIO XT patch monitor.  Call them immediately if you see an orange light blinking on your monitor. ?  ?If your monitor falls off in less than 4 days contact our Monitor department at [phone].  If your monitor becomes loose or falls off after 4 days call [employer] at [phone] for suggestions on securing your monitor. ? ?Follow-Up: ?Your physician recommends that you schedule a follow-up appointment in: 4 months ? ?Any Other Special Instructions Will Be Listed Below (If  Applicable). ? ?If you need a refill on your cardiac medications before your next appointment, please call your pharmacy. ?

## 2021-07-13 ENCOUNTER — Ambulatory Visit: Payer: Medicare Other | Admitting: "Endocrinology

## 2021-07-28 ENCOUNTER — Ambulatory Visit (HOSPITAL_COMMUNITY): Payer: Medicare Other

## 2021-08-09 ENCOUNTER — Ambulatory Visit (HOSPITAL_COMMUNITY)
Admission: RE | Admit: 2021-08-09 | Discharge: 2021-08-09 | Disposition: A | Discharge status: Home / Self Care | Payer: Medicare Other | Source: Ambulatory Visit | Attending: Cardiology | Admitting: Cardiology

## 2021-08-09 DIAGNOSIS — R0609 Other forms of dyspnea: Secondary | ICD-10-CM | POA: Insufficient documentation

## 2021-08-09 LAB — ECHOCARDIOGRAM COMPLETE
AR max vel: 1.89 cm2
AV Area VTI: 1.74 cm2
AV Area mean vel: 1.68 cm2
AV Mean grad: 5 mmHg
AV Peak grad: 9 mmHg
Ao pk vel: 1.5 m/s
Area-P 1/2: 3.43 cm2
Calc EF: 57.3 %
MV VTI: 1.83 cm2
S' Lateral: 2.7 cm
Single Plane A2C EF: 38.5 %
Single Plane A4C EF: 68.5 %

## 2021-08-09 NOTE — Progress Notes (Signed)
*  PRELIMINARY RESULTS* ?Echocardiogram ?2D Echocardiogram has been performed. ? ?Glen Guerrero ?08/09/2021, 9:54 AM ?

## 2021-08-12 HISTORY — PX: CARPAL TUNNEL RELEASE: SHX101

## 2021-08-16 ENCOUNTER — Telehealth: Payer: Self-pay

## 2021-08-16 NOTE — Telephone Encounter (Signed)
Patient notified and verbalized understanding. Pt has declined PFT's d/t not having sob at this time. ?

## 2021-08-16 NOTE — Telephone Encounter (Signed)
-----   Message from Arnoldo Lenis, MD sent at 08/16/2021  2:53 PM EDT ----- ?Normal echo, heart function looks fine. Can we order PFTs for shortness of breath please ? ?Zandra Abts MD ?

## 2021-08-23 ENCOUNTER — Other Ambulatory Visit: Payer: Self-pay

## 2021-08-23 DIAGNOSIS — Z8546 Personal history of malignant neoplasm of prostate: Secondary | ICD-10-CM

## 2021-08-23 DIAGNOSIS — E291 Testicular hypofunction: Secondary | ICD-10-CM

## 2021-08-24 LAB — PSA: Prostate Specific Ag, Serum: 0.2 ng/mL (ref 0.0–4.0)

## 2021-08-24 LAB — TESTOSTERONE: Testosterone: 104 ng/dL — ABNORMAL LOW (ref 264–916)

## 2021-08-26 ENCOUNTER — Ambulatory Visit (INDEPENDENT_AMBULATORY_CARE_PROVIDER_SITE_OTHER): Payer: Medicare Other | Admitting: Urology

## 2021-08-26 ENCOUNTER — Encounter: Payer: Self-pay | Admitting: Urology

## 2021-08-26 VITALS — BP 150/70 | HR 71

## 2021-08-26 DIAGNOSIS — R351 Nocturia: Secondary | ICD-10-CM

## 2021-08-26 DIAGNOSIS — N5201 Erectile dysfunction due to arterial insufficiency: Secondary | ICD-10-CM

## 2021-08-26 DIAGNOSIS — Z8546 Personal history of malignant neoplasm of prostate: Secondary | ICD-10-CM | POA: Diagnosis not present

## 2021-08-26 DIAGNOSIS — N138 Other obstructive and reflux uropathy: Secondary | ICD-10-CM

## 2021-08-26 DIAGNOSIS — E291 Testicular hypofunction: Secondary | ICD-10-CM

## 2021-08-26 DIAGNOSIS — N401 Enlarged prostate with lower urinary tract symptoms: Secondary | ICD-10-CM

## 2021-08-26 LAB — URINALYSIS, ROUTINE W REFLEX MICROSCOPIC
Bilirubin, UA: NEGATIVE
Ketones, UA: NEGATIVE
Leukocytes,UA: NEGATIVE
Nitrite, UA: NEGATIVE
RBC, UA: NEGATIVE
Specific Gravity, UA: 1.015 (ref 1.005–1.030)
Urobilinogen, Ur: 0.2 mg/dL (ref 0.2–1.0)
pH, UA: 6 (ref 5.0–7.5)

## 2021-08-26 NOTE — Progress Notes (Signed)
?Subjective: ? ?1. 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 BPH with urinary obstruction   ?4. 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  ?I have prostate cancer. HPI: Glen Guerrero is a 76 year-old male established patient who is here evaluation for treatment of prostate cancer. ? ?His prostate cancer was diagnosed 01/04/2019. He does have the pathology report from his biopsy. His PSA at his time of diagnosis was 3.7.  ? ?08/26/21: Florencio returns today in f/u for the history below.  His PSA is stable at 0.2.  His T is up slightly to 104.  He is voiding ok with an IPSS of 8 and nocturia x 2.  He has no hot flashes.  His weight is up some.  He has good energy and strength particularly over the last month.  He has persistent ED.  He has had no hematuria.   He has no GI complaints.   He no bone pain.   ? ?02/25/21: Mr. Axel returns for the history noted below.  His PSA on 02/05/21 was 0.2 which is up from <0.1 in 4/22.  The testosterone remains low at 85.  He last had Eligard '30mg'$  on 08/09/19.  He completed EXRT in 2/21.  He had T2a GG3 prostate cancer on his pretreatment biopsy.  He is voiding wel but has nocturia x 2 and some intermittency.  His IPSS is 5.  He is taking ambien for sleep issues related to restless leg.  He has lost a couple of lbs.  He has no GI complaints but he had a SBO 5 weeks ago that resolved with conservative management.  He has ED and didn't have a good response to PDE5's.   He had headaches with it.   He is not having hot flashes but he has malaise in the afternoon and has chronic myalgias.  ? ? He has been seen at the [facility] for his T2a Gleason 7(4+3) prostate cancer. He has elected to proceed with EXRT with adjuvanct ADT for 6-8 months. He will be getting his EXRT at [facility] and I will await their input regarding the need for [employer] and fiducials. He was initially seen for BPH with BOO and was found to have an 34m right base nodule with a  PSA of 3.7 which was from 10/19 prior to the initiation of finasteride which he is currently taking. He had a biopsy that demonstrated a 371mprostate with a T2a N0 M0 Gleason 7(4+3) prostate cancer. The right base and mid lateral cores had Gleason 7(4+3) up to 90% involvement. The remaining 4 right cores had 7(3+4) disease up to 85% involvement. 3/6 left cores had low volume Gleason 6 disease. The bone scan is negative and a CT showed some small subpleural nodules favored to be nodes but no other evidence of metastatic disease. His comorbidities include HTN, hyperlipidemia, and diabetes. His voiding symptoms have improved since his initial visit.  ? ?MSKCC nomogram predicts. 28% OCD, 69% ECE, 19% LNI and 20% SVI.  ? ? ? ? ? ? ?ROS: ? ?ROS:  ?A complete review of systems was performed.  All systems are negative except for pertinent findings as noted above.  ? ? ? ?Allergies  ?Allergen Reactions  ? Mirapex  [Pramipexole Dihydrochloride] Other (See Comments)  ? Other Other (See Comments)  ? Pramipexole Other (See Comments)  ? Ciprofloxacin Nausea Only  ?  Lightheaded  ? Flagyl [Metronidazole] Other (See Comments)  ?  Can't remember symptoms  ? Ibuprofen   ? Tramadol Nausea And Vomiting  ?  Can't take any medication similar to Tramadol  ? ? ?Outpatient Encounter Medications as of 08/26/2021  ?Medication Sig  ? amLODipine (NORVASC) 10 MG tablet Take 10 mg by mouth daily.  ? ascorbic acid (VITAMIN C) 1000 MG tablet Take by mouth.  ? aspirin EC 81 MG tablet Take 81 mg by mouth daily.  ? atorvastatin (LIPITOR) 20 MG tablet Take 20 mg by mouth daily.  ? calcium carbonate (OSCAL) 1500 (600 Ca) MG TABS tablet Take by mouth 2 (two) times daily with a meal.  ? CHERRY PO Take 100 mg by mouth 2 (two) times daily.  ? Cholecalciferol 25 MCG (1000 UT) capsule Vitamin D3 25 mcg (1,000 unit) capsule ? TAKE 1 CAPSULE (1,000 UNITS TOTAL) BY MOUTH ONE TIME EACH DAY.  ? ezetimibe (ZETIA) 10 MG tablet Take 10 mg by mouth daily.  ?  fenofibrate micronized (LOFIBRA) 134 MG capsule Take by mouth daily.  ? fexofenadine (ALLEGRA) 180 MG tablet Take 180 mg by mouth daily.  ? fluorouracil (EFUDEX) 5 % cream Apply 1 application topically as needed.   ? fluticasone (FLONASE) 50 MCG/ACT nasal spray Place 1 spray into both nostrils daily.  ? Gabapentin Enacarbil (HORIZANT) 600 MG TBCR Horizant ER 600 mg tablet,extended release ?  600 mg every day by oral route.  ? glipiZIDE (GLUCOTROL) 10 MG tablet Take 10 mg by mouth daily before breakfast.   ? hydrALAZINE (APRESOLINE) 50 MG tablet Take by mouth.  ? JARDIANCE 25 MG TABS tablet Take 25 mg by mouth daily.  ? LEVEMIR FLEXTOUCH 100 UNIT/ML Pen Inject 40 Units into the skin 2 (two) times daily.  ? Multiple Vitamins-Minerals (ZINC PO) Take by mouth daily.  ? NON FORMULARY Uses CPAP nightly  ? Omega-3 Fatty Acids (FISH OIL PO) Take by mouth.  ? POTASSIUM CHLORIDE ER PO Take 99 mg by mouth daily at 6 (six) AM.  ? pregabalin (LYRICA) 100 MG capsule Take 100 mg by mouth 2 (two) times daily.  ? rOPINIRole (REQUIP) 1 MG tablet Take 1 mg by mouth 2 (two) times daily. Take 1 mg at 4:00pm  and take 2 mg at night  ? triamcinolone cream (KENALOG) 0.1 % triamcinolone acetonide 0.1 % topical cream ? APPLY TO ITCHY SPOTS ON BACK  ? valsartan (DIOVAN) 40 MG tablet Take 40 mg by mouth daily.  ? VITAMIN D PO Take by mouth daily.  ? [DISCONTINUED] pregabalin (LYRICA) 100 MG capsule Take 100 mg by mouth 2 (two) times daily.  ? zolpidem (AMBIEN) 5 MG tablet Take 5 mg by mouth at bedtime as needed.  ? [DISCONTINUED] Gabapentin Enacarbil ER 300 MG TBCR Take 1 tablet by mouth daily. (Patient not taking: Reported on 07/12/2021)  ? [DISCONTINUED] Melatonin 10 MG TABS Take 1 tablet by mouth at bedtime as needed.  ? ?No facility-administered encounter medications on file as of 08/26/2021.  ? ? ?Past Medical History:  ?Diagnosis Date  ? Arthritis   ? Carpal tunnel syndrome   ? Diverticulitis   ? DM type 2 (diabetes mellitus, type 2) ([place])    ? Gout   ? History of colon polyps   ? 2015 polyp x 1 dysplastic and adenomatous pieces.  ? Hypercholesterolemia   ? Hypertension   ? Insomnia   ? Prostate cancer ([place])   ? Prostate enlargement   ? Restless leg syndrome   ? Seasonal allergies   ? Skin cancer   ? ? ?  Past Surgical History:  ?Procedure Laterality Date  ? APPENDECTOMY    ? CARPAL TUNNEL RELEASE Left 08/12/2021  ? COLONOSCOPY  04/25/2013  ? Dr.Spainhour- polypectomy x 1 pieces dysplastic and adenomatous; Recc repeat in 2020 (5 years).  ? COLONOSCOPY N/A 12/05/2017  ? Procedure: COLONOSCOPY;  Surgeon: Daneil Dolin, MD;  [facility];  Service: Endoscopy;  Laterality: N/A;  11:15am  ? HERNIA REPAIR    ? inguinal x2  ? POLYPECTOMY  12/05/2017  ? Procedure: POLYPECTOMY;  Surgeon: Daneil Dolin, MD;  [facility];  Service: Endoscopy;;  colon ?  ? TONSILLECTOMY    ? ? ?Social History  ? ?Socioeconomic History  ? Marital status: Married  ?  Spouse name: Katharine Look  ? Number of children: 3  ? Years of education: Not on file  ? Highest education level: Not on file  ?Occupational History  ? Not on file  ?Tobacco Use  ? Smoking status: Some Days  ?  Types: Cigars  ?  Start date: 04/12/1969  ? Smokeless tobacco: Former  ?  Quit date: 05/20/1979  ? Tobacco comments:  ?  5 cigars per week  ?Vaping Use  ? Vaping Use: Never used  ?Substance and Sexual Activity  ? Alcohol use: Not Currently  ?  Comment: wine once a week; occ whiskey  ? Drug use: No  ? Sexual activity: Yes  ?Other Topics Concern  ? Not on file  ?Social History Narrative  ? Retired  ? 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 Three children: Revonda Humphrey and Lattie Haw  ? ?Social Determinants of Health  ? ?Financial Resource Strain: Not on file  ?Food Insecurity: Not on file  ?Transportation Needs: Not on file  ?Physical Activity: Not on file  ?Stress: Not on file  ?Social Connections: Not on file  ?Intimate Partner Violence: Not on file  ? ? ?Family History  ?Problem Relation  Age of Onset  ? Hypertension Mother   ? Heart failure Mother   ? Prostate cancer Father   ? Hypertension Child   ? Hypertension Child   ? Hypertension Child   ? High Cholesterol Child   ? High Cholesterol Child

## 2021-09-09 ENCOUNTER — Ambulatory Visit: Payer: Medicare Other | Admitting: Neurology

## 2021-09-15 ENCOUNTER — Telehealth: Payer: Self-pay | Admitting: Cardiology

## 2021-09-15 NOTE — Telephone Encounter (Signed)
Patient notified and verbalized understanding. Pt had no questions or concerns at this time 

## 2021-09-15 NOTE — Telephone Encounter (Signed)
Patient states he is returning a call to [place], but he is unsure what it was regarding. CMA unavailable for transfer.

## 2021-09-15 NOTE — Telephone Encounter (Signed)
-----   Message from Arnoldo Lenis, MD sent at 09/15/2021  3:08 PM EDT ----- Heart monitor overall looks good, just some occasional extra heart beats which are benign. No further workup indicated   Zandra Abts MD

## 2021-10-12 ENCOUNTER — Encounter: Payer: Self-pay | Admitting: Gastroenterology

## 2021-10-12 ENCOUNTER — Ambulatory Visit (INDEPENDENT_AMBULATORY_CARE_PROVIDER_SITE_OTHER): Payer: Medicare Other | Admitting: Gastroenterology

## 2021-10-12 VITALS — BP 134/76 | HR 62 | Temp 97.5°F | Ht 72.0 in | Wt 220.2 lb

## 2021-10-12 DIAGNOSIS — R748 Abnormal levels of other serum enzymes: Secondary | ICD-10-CM | POA: Diagnosis not present

## 2021-10-12 DIAGNOSIS — R143 Flatulence: Secondary | ICD-10-CM | POA: Diagnosis not present

## 2021-10-12 DIAGNOSIS — A09 Infectious gastroenteritis and colitis, unspecified: Secondary | ICD-10-CM

## 2021-10-12 NOTE — Patient Instructions (Signed)
Please have labs and stool test done at [place]. If you have not heard from me within 5 business days, please call and make sure we received results.  You can try imodium 1/2 to 1 tablet daily before leaving the house if needed. I would suggest minimizing unless you have had more than 2 loose stools in 24 hours.  Start a probiotic once daily. Good brands are Align, Restora, KeySpan.  If you feel bad, have night sweats, etc please check for low/high sugars, fever.

## 2021-10-12 NOTE — Progress Notes (Signed)
GI Office Note    Referring Provider: Celene Squibb, MD Primary Care Physician:  Celene Squibb, MD  Primary Gastroenterologist: Garfield Cornea, MD   Chief Complaint   Chief Complaint  Patient presents with   Follow-up    Has had some issues with diarrhea and uncontrollable gas.     History of Present Illness   Glen Couzens. is a 76 y.o. male presenting today for follow-up.  He was last seen in November 2022.  He has a history of small bowel obstruction in 2022 while on vacation in [place], transition point in right lower quadrant.  Also noted to have a slightly elevated AST of 49 and lipase of 258 during admission.  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  Completed CT enterography to further evaluate causes of small bowel obstruction.  No significant findings seen.  Obstruction felt to be related to adhesions.  Repeat lipase mildly elevated at 98 (normal 13-78).  Creatinine 1.70.  LFTs normal.  Labs from May 2023: Glucose 129, BUN 28, creatinine 1.52, eGFR 48, sodium 143, white blood cell count 3800, hemoglobin 14.3, platelets 184,000.  Labs from 05/2021: A1C 8.3.  Labs from January 2023: Iron 100, TIBC 361, iron saturation is 28%, ferritin 126.  Today: for couple of weeks, having bad diarrhea, also with terrible uncontrollable gas. New symptoms. Happened few weeks ago and then happened four days in a row in the last week or so. Gas worse in the afternoon lately, before was all the time. On diarrhea days, 5-6 at least per day. Could not leave his house. Sometimes nothing but watery stools. At baseline, usually no more than 2 stools per day. No abdominal pain. No melena, brbpr. No ill contacts. New medications include lyrica $RemoveBefore'50mg'vPSgbwSfcCiAN$  bid, propranolol $RemoveBeforeDE'40mg'ydBVYbtijByRYwV$  daily. Has been on lyrica for couple of months and propranolol for few weeks. Denies nocturnal diarrhea/symptoms. Has tried gas-x, imodium, IBgard all only one or twice.   He also complains of chronic feeling of not feeling well. Has seen cardiology, per patient had heart monitor done and unremarkable. He has sleep apnea and uses cpap. He has woken up a couple of nights ago with sheets being wet. No documented fever Sunday when he felt his worse but he didn't check at night when he had sweats. He also denies symptoms associated with low sugars. States his Dexcom would alert him of low sugars but he admits that he turned off the alert for high sugars because it was going off too much. He does not check his sugars when he is symptomatic. He denies weight loss.     Medications   Current Outpatient Medications  Medication Sig Dispense Refill   amLODipine (NORVASC) 10 MG tablet Take 10 mg by mouth daily.     ascorbic acid (VITAMIN C) 1000 MG tablet Take by mouth.     aspirin EC 81 MG tablet Take 81 mg by mouth daily.     atorvastatin (LIPITOR) 20 MG tablet Take 20 mg by mouth daily.     Biotin 10000 MCG TABS Take by mouth.     calcium carbonate (OSCAL) 1500 (600 Ca) MG TABS tablet Take by mouth 2 (two) times daily with a meal.     CHERRY PO Take 100 mg by mouth 2 (two) times daily.     Cholecalciferol 25 MCG (1000 UT) capsule Vitamin D3 25 mcg (1,000 unit) capsule  TAKE 1 CAPSULE (  1,000 UNITS TOTAL) BY MOUTH ONE TIME EACH DAY.     ezetimibe (ZETIA) 10 MG tablet Take 10 mg by mouth daily.     fenofibrate micronized (LOFIBRA) 134 MG capsule Take by mouth daily.     fexofenadine (ALLEGRA) 180 MG tablet Take 180 mg by mouth daily.     fluorouracil (EFUDEX) 5 % cream Apply 1 application topically as needed.      fluticasone (FLONASE) 50 MCG/ACT nasal spray Place 1 spray into both nostrils daily.     Gabapentin Enacarbil (HORIZANT) 600 MG TBCR Horizant ER 600 mg tablet,extended release   600 mg every day by oral route.     glipiZIDE (GLUCOTROL) 10 MG tablet Take 10 mg by mouth daily before breakfast.      hydrALAZINE (APRESOLINE) 50 MG tablet Take by mouth.     JARDIANCE  25 MG TABS tablet Take 25 mg by mouth daily.  1   LEVEMIR FLEXTOUCH 100 UNIT/ML Pen Inject 40 Units into the skin 2 (two) times daily.     Multiple Vitamins-Minerals (ZINC PO) Take by mouth daily.     NON FORMULARY Uses CPAP nightly     NOVOLOG FLEXPEN 100 UNIT/ML FlexPen SMARTSIG:8 Unit(s) SUB-Q 3 Times Daily     POTASSIUM CHLORIDE ER PO Take 99 mg by mouth daily at 6 (six) AM.     pregabalin (LYRICA) 100 MG capsule Take 100 mg by mouth 2 (two) times daily.     rOPINIRole (REQUIP) 1 MG tablet Take 1 mg by mouth 2 (two) times daily. Take 1 mg at 4:00pm  and take 2 mg at night     valsartan (DIOVAN) 40 MG tablet Take 40 mg by mouth daily.     zolpidem (AMBIEN) 5 MG tablet Take 5 mg by mouth at bedtime as needed.     No current facility-administered medications for this visit.    Allergies   Allergies as of 10/12/2021 - Review Complete 10/12/2021  Allergen Reaction Noted   Mirapex  [pramipexole dihydrochloride] Other (See Comments) 02/12/2019   Other Other (See Comments) 02/12/2019   Pramipexole Other (See Comments) 02/26/2019   Ciprofloxacin Nausea Only 05/19/2014   Flagyl [metronidazole] Other (See Comments) 05/19/2014   Ibuprofen  05/31/2019   Tramadol Nausea And Vomiting 10/24/2017       Review of Systems   General: Negative for anorexia, weight loss, fever, chills, fatigue, weakness. ENT: Negative for hoarseness, difficulty swallowing , nasal congestion. CV: Negative for chest pain, angina, palpitations, dyspnea on exertion, peripheral edema.  Respiratory: Negative for dyspnea at rest, dyspnea on exertion, cough, sputum, wheezing.  GI: See history of present illness. GU:  Negative for dysuria, hematuria, urinary incontinence, urinary frequency, nocturnal urination.  Endo: Negative for unusual weight change.     Physical Exam   BP 134/76 (BP Location: Right Arm, Patient Position: Sitting, Cuff Size: Normal)   Pulse 62   Temp (!) 97.5 F (36.4 C) (Temporal)   Ht 6' (1.829  m)   Wt 220 lb 3.2 oz (99.9 kg)   SpO2 96%   BMI 29.86 kg/m    General: Well-nourished, well-developed in no acute distress.  Eyes: No icterus. Mouth: Oropharyngeal mucosa moist and pink , no lesions erythema or exudate. Lungs: Clear to auscultation bilaterally.  Heart: Regular rate and rhythm, no murmurs rubs or gallops.  Abdomen: Bowel sounds are normal, nontender, nondistended, no hepatosplenomegaly or masses,  no abdominal bruits or hernia , no rebound or guarding.  Rectal: not performed Extremities: No  lower extremity edema. No clubbing or deformities. Neuro: Alert and oriented x 4   Skin: Warm and dry, no jaundice.   Psych: Alert and cooperative, normal mood and affect.  Labs    Lab Results  Component Value Date   LIPASE 99 (H) 03/29/2021   Lab Results  Component Value Date   ALT 28 03/29/2021   AST 24 03/29/2021   ALKPHOS 61 03/29/2021   BILITOT 0.3 03/29/2021    Imaging Studies   No results found.  Assessment   Change in bowels/diarrhea/excessive flatulence: possibly infectious etiology vs pancreatic insufficiency vs medication side effect. If work up unremarkable, would consider updating colonoscopy as next step.   Elevated lipase: etiology unclear. Felt to be nonspecific based on degree of elevated at time of SBO (200 range). Rechecked when feeling well, still mildly elevated in the 90s. ?significance. Pancreas unremarkable on CTE A/P with and without contrast. Remote regular etoh use but not clearly abuse. No etoh in over one year. Update labs at this time.    PLAN  Stool for Cdiff/GI profile, fecal elastase.  Screen for celiac, thyroid issues. Recheck lipase.  If unremarkable, he may need colonoscopy as next step to evaluate change in bowels.  Start probiotics daily. If recurrent night sweats, feeling poorly, would recommend him check for fever and low/high sugars.  Can use imodium limited basis, if more than 2 loose stools in a 24 hour period of time.       Glen Ochs. Glen Guerrero, [place], [facility]

## 2021-10-16 LAB — GI PROFILE, STOOL, PCR

## 2021-10-16 LAB — CLOSTRIDIUM DIFFICILE BY PCR: Toxigenic C. Difficile by PCR: NEGATIVE

## 2021-10-16 LAB — TSH+FREE T4
Free T4: 1.03 ng/dL (ref 0.82–1.77)
TSH: 1.72 u[IU]/mL (ref 0.450–4.500)

## 2021-10-16 LAB — LIPASE: Lipase: 43 U/L (ref 13–78)

## 2021-10-16 LAB — PANCREATIC ELASTASE, FECAL: Pancreatic Elastase, Fecal: 145 ug Elast./g — ABNORMAL LOW (ref >200)

## 2021-10-16 LAB — TISSUE TRANSGLUTAMINASE, IGA: Transglutaminase IgA: <2 U/mL (ref 0–3)

## 2021-10-16 LAB — IGA: IgA/Immunoglobulin A, Serum: 105 mg/dL (ref 61–437)

## 2021-10-28 ENCOUNTER — Other Ambulatory Visit: Payer: Self-pay | Admitting: Gastroenterology

## 2021-10-28 MED ORDER — PANCRELIPASE (LIP-PROT-AMYL) 36000-114000 UNITS PO CPEP: ORAL_CAPSULE | ORAL | 5 refills | Status: DC

## 2021-10-29 ENCOUNTER — Encounter: Payer: Self-pay | Admitting: Internal Medicine

## 2021-11-01 ENCOUNTER — Other Ambulatory Visit: Payer: Self-pay

## 2021-11-01 MED ORDER — PANCRELIPASE (LIP-PROT-AMYL) 36000-114000 UNITS PO CPEP: ORAL_CAPSULE | ORAL | 5 refills | Status: DC

## 2021-11-03 ENCOUNTER — Telehealth: Payer: Self-pay

## 2021-11-03 ENCOUNTER — Encounter: Payer: Self-pay | Admitting: Cardiology

## 2021-11-03 ENCOUNTER — Ambulatory Visit (INDEPENDENT_AMBULATORY_CARE_PROVIDER_SITE_OTHER): Payer: Medicare Other | Admitting: Cardiology

## 2021-11-03 VITALS — BP 126/70 | HR 66 | Ht 72.0 in | Wt 219.4 lb

## 2021-11-03 DIAGNOSIS — R5383 Other fatigue: Secondary | ICD-10-CM | POA: Diagnosis not present

## 2021-11-03 DIAGNOSIS — K8689 Other specified diseases of pancreas: Secondary | ICD-10-CM

## 2021-11-03 DIAGNOSIS — R0609 Other forms of dyspnea: Secondary | ICD-10-CM | POA: Diagnosis not present

## 2021-11-03 DIAGNOSIS — R748 Abnormal levels of other serum enzymes: Secondary | ICD-10-CM

## 2021-11-03 NOTE — Telephone Encounter (Signed)
Pt called and is wanting to go ahead with the CT scan of his pancreas that you had recommended that he have via a myChart message.

## 2021-11-03 NOTE — Telephone Encounter (Signed)
We can move towards pancreatic protocol CT. Dx: pancreatic insufficiency, elevated lipase, rule out chronic pancreatitis or occult malignancy.  He will need updated basic metabolic panel due to his renal insufficiency, he should have it done about one week or less before his CT.

## 2021-11-03 NOTE — Progress Notes (Signed)
Clinical Summary Mr. Osmun is a 76 y.o.male  1. Sinus bradycardia, 1st degree AV block - we previously had stopped his beta blocker, HRs normalized at that time    07/2021 monitor: rare ectopy, 6 short runs SVT longest 5 beats, min HR 49, Max HR 176, avg HR 71 - no recent syncope, presyncope. Nonspecific days of generalized fatigue of unclear origin.         2. 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  - hghest exertion is walking dogs, few hundred yards.  - nonspecific chest discomfort right chest or left chest. Spasm like feeling, just a second or two - no cough, mild wheezing. Former cigar smoker - has had some recent LE edema.     07/2021 echo: LVEF 60-65%, no WMAs, normal diastolic, normal RV - we had considered PFTs but patient reported symptoms had improved.  - breathing is improving since last visit         3. OSA  uses CPAP and is compliant  - followed by [facility]   4. Prostate cancer  5. Hyperlipidemia - recent labs with pcp  6. CKD - followed by Dr Theador Hawthorne Past Medical History:  Diagnosis Date   Arthritis    Carpal tunnel syndrome    Diverticulitis    DM type 2 (diabetes mellitus, type 2) ([facility])    Gout    History of colon polyps    2015 polyp x 1 dysplastic and adenomatous pieces.   Hypercholesterolemia    Hypertension    Insomnia    Prostate cancer (HCC)    Prostate enlargement    Restless leg syndrome    Seasonal allergies    Skin cancer      Allergies  Allergen Reactions   Mirapex  [Pramipexole Dihydrochloride] Other (See Comments)   Other Other (See Comments)   Pramipexole Other (See Comments)   Ciprofloxacin Nausea Only    Lightheaded   Flagyl [Metronidazole] Other (See Comments)    Can't remember symptoms   Ibuprofen    Tramadol Nausea And Vomiting    Can't take any medication similar to Tramadol     Current Outpatient Medications  Medication Sig Dispense Refill   amLODipine  (NORVASC) 10 MG tablet Take 10 mg by mouth daily.     ascorbic acid (VITAMIN C) 1000 MG tablet Take by mouth.     aspirin EC 81 MG tablet Take 81 mg by mouth daily.     atorvastatin (LIPITOR) 20 MG tablet Take 20 mg by mouth daily.     Biotin 10000 MCG TABS Take by mouth.     calcium carbonate (OSCAL) 1500 (600 Ca) MG TABS tablet Take by mouth 2 (two) times daily with a meal.     CHERRY PO Take 100 mg by mouth 2 (two) times daily.     Cholecalciferol 25 MCG (1000 UT) capsule Vitamin D3 25 mcg (1,000 unit) capsule  TAKE 1 CAPSULE (1,000 UNITS TOTAL) BY MOUTH ONE TIME EACH DAY.     ezetimibe (ZETIA) 10 MG tablet Take 10 mg by mouth daily.     fenofibrate micronized (LOFIBRA) 134 MG capsule Take by mouth daily.     fexofenadine (ALLEGRA) 180 MG tablet Take 180 mg by mouth daily.     fluorouracil (EFUDEX) 5 % cream Apply 1 application topically as needed.      fluticasone (FLONASE) 50 MCG/ACT nasal spray Place 1 spray into both nostrils daily.  Gabapentin Enacarbil (HORIZANT) 600 MG TBCR Horizant ER 600 mg tablet,extended release   600 mg every day by oral route.     glipiZIDE (GLUCOTROL) 10 MG tablet Take 10 mg by mouth daily before breakfast.      hydrALAZINE (APRESOLINE) 50 MG tablet Take by mouth.     JARDIANCE 25 MG TABS tablet Take 25 mg by mouth daily.  1   LEVEMIR FLEXTOUCH 100 UNIT/ML Pen Inject 40 Units into the skin 2 (two) times daily.     lipase/protease/amylase (CREON) 36000 UNITS CPEP capsule Take 3 capsules with breakfast/lunch/supper and 2 capsules with snacks. Take with first bite of food. 390 capsule 5   Multiple Vitamins-Minerals (ZINC PO) Take by mouth daily.     NON FORMULARY Uses CPAP nightly     NOVOLOG FLEXPEN 100 UNIT/ML FlexPen SMARTSIG:8 Unit(s) SUB-Q 3 Times Daily     POTASSIUM CHLORIDE ER PO Take 99 mg by mouth daily at 6 (six) AM.     pregabalin (LYRICA) 50 MG capsule Take 50 mg by mouth in the morning and at bedtime.     rOPINIRole (REQUIP) 1 MG tablet Take 1  mg by mouth 2 (two) times daily. Take 1 mg at 4:00pm  and take 2 mg at night     valsartan (DIOVAN) 40 MG tablet Take 40 mg by mouth daily.     zolpidem (AMBIEN) 5 MG tablet Take 5 mg by mouth at bedtime as needed.     No current facility-administered medications for this visit.     Past Surgical History:  Procedure Laterality Date   APPENDECTOMY     CARPAL TUNNEL RELEASE Left 08/12/2021   COLONOSCOPY  04/25/2013   Dr.Spainhour- polypectomy x 1 pieces dysplastic and adenomatous; Recc repeat in 2020 (5 years).   COLONOSCOPY N/A 12/05/2017   Procedure: COLONOSCOPY;  Surgeon: Daneil Dolin, MD;  Location: [facility];  Service: Endoscopy;  Laterality: N/A;  11:15am   HERNIA REPAIR     inguinal x2   POLYPECTOMY  12/05/2017   Procedure: POLYPECTOMY;  Surgeon: Daneil Dolin, MD;  Location: [facility];  Service: Endoscopy;;  colon    TONSILLECTOMY       Allergies  Allergen Reactions   Mirapex  [Pramipexole Dihydrochloride] Other (See Comments)   Other Other (See Comments)   Pramipexole Other (See Comments)   Ciprofloxacin Nausea Only    Lightheaded   Flagyl [Metronidazole] Other (See Comments)    Can't remember symptoms   Ibuprofen    Tramadol Nausea And Vomiting    Can't take any medication similar to Tramadol      Family History  Problem Relation Age of Onset   Hypertension Mother    Heart failure Mother    Prostate cancer Father    Hypertension Child    Hypertension Child    Hypertension Child    High Cholesterol Child    High Cholesterol Child    High Cholesterol Child    Cancer Paternal Aunt        unknown type   Cancer Paternal Aunt        unknown type   Colon cancer Neg Hx      Social History Mr. Faul reports that he has been smoking cigars. He started smoking about 52 years ago. He quit smokeless tobacco use about 42 years ago. Mr. Dancel reports that he does not currently use alcohol.   Review of Systems CONSTITUTIONAL: No weight loss,  fever, chills, weakness or fatigue.  HEENT: Eyes: No visual loss, blurred vision, double vision or yellow sclerae.No hearing loss, sneezing, congestion, runny nose or sore throat.  SKIN: No rash or itching.  CARDIOVASCULAR: per hpi RESPIRATORY: No shortness of breath, cough or sputum.  GASTROINTESTINAL: No anorexia, nausea, vomiting or diarrhea. No abdominal pain or blood.  GENITOURINARY: No burning on urination, no polyuria NEUROLOGICAL: No headache, dizziness, syncope, paralysis, ataxia, numbness or tingling in the extremities. No change in bowel or bladder control.  MUSCULOSKELETAL: No muscle, back pain, joint pain or stiffness.  LYMPHATICS: No enlarged nodes. No history of splenectomy.  PSYCHIATRIC: No history of depression or anxiety.  ENDOCRINOLOGIC: No reports of sweating, cold or heat intolerance. No polyuria or polydipsia.  Marland Kitchen   Physical Examination Today's Vitals   11/03/21 1036  BP: 126/70  Pulse: 66  SpO2: 96%  Weight: 219 lb 6.4 oz (99.5 kg)  Height: 6' (1.829 m)   Body mass index is 29.76 kg/m.  Gen: resting comfortably, no acute distress HEENT: no scleral icterus, pupils equal round and reactive, no palptable cervical adenopathy,  CV: RRR, no m/r/g no jvd Resp: Clear to auscultation bilaterally GI: abdomen is soft, non-tender, non-distended, normal bowel sounds, no hepatosplenomegaly MSK: extremities are warm, no edema.  Skin: warm, no rash Neuro:  no focal deficits Psych: appropriate affect   Diagnostic Studies  07/2021 echo 1. Left ventricular ejection fraction, by estimation, is 60 to 65%. The  left ventricle has normal function. The left ventricle has no regional  wall motion abnormalities. There is mild left ventricular hypertrophy.  Left ventricular diastolic parameters  were normal.   2. Right ventricular systolic function is normal. The right ventricular  size is normal.   3. Left atrial size was mildly dilated.   4. The mitral valve is normal in  structure. No evidence of mitral valve  regurgitation. No evidence of mitral stenosis.   5. Very mild stenosis mean gradient 5 mmhg normal DVI 0.77 AVA 1.9 cm2.  The aortic valve is tricuspid. There is moderate calcification of the  aortic valve. There is moderate thickening of the aortic valve. Aortic  valve regurgitation is not visualized.  Mild aortic valve stenosis.   6. The inferior vena cava is normal in size with greater than 50%  respiratory variability, suggesting right atrial pressure of 3 mmHg.    07/2021 monitor 7 day monitor Rare supraventricular ectopy in the form of isolated PACs, couplets, triplets. 6 runs of SVT longest 5 beats. Rare ventricualr ectopy in the form of isolated PVCs, couplets, triplets Reported symptoms correlate with sinus rhythm.   Assessment and Plan  1. DOE -benign echo, symptoms are improving - if recurrence would consider PFTs  2. Fatigue -nonspecific generalized fatigue at times, unclear etiology -benign recent cardiac monitor, no recurrent issues with bradycardia noted - no further workup, defer generalized fatigue eval to pcp  F/u 1 year    Arnoldo Lenis, M.D.

## 2021-11-03 NOTE — Patient Instructions (Signed)
Medication Instructions:  Your physician recommends that you continue on your current medications as directed. Please refer to the Current Medication list given to you today.   Labwork: None  Testing/Procedures: None  Follow-Up:  Your physician recommends that you schedule a follow-up appointment in: 1 year  Any Other Special Instructions Will Be Listed Below (If Applicable).  You will receive a call in about 10 months reminding you to schedule your appointment. If you do not receive this call, please contact our office.   If you need a refill on your cardiac medications before your next appointment, please call your pharmacy.

## 2021-11-04 NOTE — Addendum Note (Signed)
Addended by: Cheron Every on: 11/04/2021 08:26 AM   Modules accepted: Orders

## 2021-11-04 NOTE — Telephone Encounter (Signed)
Called pt aware of appt details. CS aware will need ISTAT prior.

## 2021-11-05 ENCOUNTER — Ambulatory Visit (HOSPITAL_COMMUNITY): Payer: Medicare Other

## 2021-11-26 NOTE — Progress Notes (Signed)
Assessment/Plan:    Tremor Little tremor was noted on examination today.  He had just a little bit of intention and postural tremor on the left hand.  He noted that propranolol helped a lot but it caused diarrhea.  I told him that I really would not recommend further treatment, but if he wanted further treatment we could certainly try primidone.  Ultimately, we both agreed that the risks outweigh benefits right this second.  We can certainly try it in the future if he would like. No evidence of Parkinsons Disease today.  Reassurance provided Patient is utilizing marijuana cookies.  We discussed scientific evidence on marijuana regarding tremor and Parkinson's (he does not have Parkinson's).  Marijuana was somewhat helpful for tremor, but it made cognitive troubles more pronounced and balance issues worse.   2.  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  Discussed with patient that there is no medication for gait instability associated with peripheral neuropathy.  The only real treatment for this is physical therapy and ambulatory assistive devices.  -Patient is already on Horizant  -last A1C 7.7  3.  Restless leg  -Patient has been on long-term ropinirole, 1 mg twice per day.  Discussed augmentation associated with the dopamine agonist medications.  This is not generally a medication that I prefer in this age group, especially in non-Parkinson's patients, but it does sound that he has not really changed the dose in over a year.  He is apparently seeing a sleep specialist (presumed possible [facility], as I did not see that within our system).   -We will check ferritin levels and B12  -Other iron studies were already done by his nephrologist and looked okay.  His TSH was normal at 1.720.  4.  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  I saw  more on the left than the right, but the patient notices them more in the right leg than the left.  I disrobed the patient and gave him examining shorts and did not see significant numbers of fasciculations elsewhere.  He reports he has chronic back issues, but states that he has not had an MRI of the lumbar spine for several years.  We are going to go ahead and do an MRI of the lumbar spine.  -EMG will be completed.  -I wonder if the dragging of the left foot is not the beginning of left foot drop.  Hopefully, EMG will help [place] elicit this.  Subjective:   Glen Guerrero. was seen today in the movement disorders clinic for neurologic consultation at the request of Celene Squibb, MD.  The consultation is for the evaluation of tremor.  Medical records made available to me are reviewed.  Medical records indicate hand tremor on the left for 8 months. Pt states that it is also on the R hand.  He is R handed.   Patient was on propranolol, 40 mg daily.  This did provide some benefit, but caused diarrhea and the medication was discontinued.  Primary care records indicate that there was very minimal intention tremor of both hands and no rest tremor on examination.  Patient has also been dealing with balance issues.  Patient has had falls.  It is noted that patient does have diabetes with known diabetic neuropathy (on Horizant and Lyrica).  Patient presents here for further examination.  Patient brings  in video of fasciculation of legs/calves.  Only happens in the AM.  Never anywhere other than calf.  He has chronic back and leg pain.  He used to see Dr. Vertell Limber.  He now sees [facility].  States that his muscles feel sore.  Does have RLS.  States that he feels like he has been on ropinirole a long time.  Feels like something is crawling on the legs when he "gets still."  Tremor: Yes.     How long has it been going on? 8 months; notes it most with trap shooting when trying to keep stable  At rest or with  activation?  Some at rest; doesn't notice it with eating; drinking.    Fam hx of tremor?  No.  Affected by caffeine:  No. (drinks 2-4 drinks coffee/day)  Affected by alcohol:  has not had much since 04/2021; prior to that was 3-4 shots/week  Affected by stress:  No.  Spills soup if on spoon:  No.  Spills glass of liquid if full:  No.  Affects ADL's (tying shoes, brushing teeth, etc):  No.  Tremor inducing meds:  No.  Other Specific Symptoms:  Voice: no problem Sleep: he has long term rls; wears cpap  Vivid Dreams:  a little  Acting out dreams:  No. Wet Pillows: No. Postural symptoms:  Yes.    Falls?  Yes.  , had bout of vertigo about 8 weeks ago and went to ER.  Was only fall that he had.  Has been dragging the L foot as well.   Bradykinesia symptoms:  drags L leg; has chronic back pain (sees [facility] for shots in back) Loss of smell:  No. Loss of taste:  No. Urinary Incontinence:  No. Difficulty Swallowing:  No. Handwriting, micrographia: No. Depression:  No. But admits to concern Memory changes:  Yes.   - no trouble with paying bells, getting places but may forget bringing his insulin when eating out.  Also reading retention is poor N/V:  No. Lightheaded:  rarely  Syncope: No. Diplopia:  No. Dyskinesia:  No.  Neuroimaging of the brain has previously been performed.  It is not available for my review today.  Patient reports that he had a CT of the brain when he went to the emergency room after he fell about 8 weeks ago.  Reports pramipexole as allergy but allergy was that he had insomnia with it.   ALLERGIES:   Allergies  Allergen Reactions   Mirapex  [Pramipexole Dihydrochloride] Other (See Comments)   Other Other (See Comments)   Pramipexole Other (See Comments)   Ciprofloxacin Nausea Only    Lightheaded   Flagyl [Metronidazole] Other (See Comments)    Can't remember symptoms   Ibuprofen    Tramadol Nausea And Vomiting    Can't take any medication similar to  Tramadol    CURRENT MEDICATIONS:  Current Outpatient Medications  Medication Instructions   amLODipine (NORVASC) 10 mg, Oral, Daily   aspirin EC 81 mg, Oral, Daily   atorvastatin (LIPITOR) 20 mg, Oral, Daily   Biotin 10000 MCG TABS Oral   calcium carbonate (OSCAL) 1500 (600 Ca) MG TABS tablet Oral, 2 times daily with meals   CHERRY PO 100 mg, Oral, 2 times daily   Cholecalciferol 25 MCG (1000 UT) capsule Vitamin D3 25 mcg (1,000 unit) capsule  TAKE 1 CAPSULE (1,000 UNITS TOTAL) BY MOUTH ONE TIME EACH DAY.   ezetimibe (ZETIA) 10 mg, Oral, Daily   fenofibrate micronized (LOFIBRA) 134  MG capsule Oral, Daily   fexofenadine (ALLEGRA) 180 mg, Oral, Daily PRN   fluorouracil (EFUDEX) 5 % cream 1 application , Topical, As needed   fluticasone (FLONASE) 50 MCG/ACT nasal spray 1 spray, Each Nare, Daily   Gabapentin Enacarbil (HORIZANT) 600 MG TBCR Horizant ER 600 mg tablet,extended release   600 mg every day by oral route.   glipiZIDE (GLUCOTROL) 10 mg, Oral, Daily before breakfast   hydrALAZINE (APRESOLINE) 50 mg, Oral, 2 times daily   Jardiance 25 mg, Oral, Daily   Levemir FlexTouch 40 Units, Subcutaneous, 2 times daily   lipase/protease/amylase (CREON) 36000 UNITS CPEP capsule Take 3 capsules with breakfast/lunch/supper and 2 capsules with snacks. Take with first bite of food.   Multiple Vitamins-Minerals (ZINC PO) Oral, Daily   NON FORMULARY Uses CPAP nightly    NOVOLOG FLEXPEN 100 UNIT/ML FlexPen SMARTSIG:8 Unit(s) SUB-Q 3 Times Daily   POTASSIUM CHLORIDE ER PO 99 mg, Oral, Daily   pregabalin (LYRICA) 50 mg, Oral, 2 times daily   rOPINIRole (REQUIP) 1 mg, Oral, 2 times daily, Take 1 mg at 4:00pm  and take 2 mg at night   valsartan (DIOVAN) 40 mg, Oral, Daily   zolpidem (AMBIEN) 5 mg, Oral, At bedtime PRN    Objective:   PHYSICAL EXAMINATION:    VITALS:   Vitals:   11/29/21 1245  BP: 130/60  Pulse: 68  SpO2: 96%  Weight: 220 lb 12.8 oz (100.2 kg)    GEN:  The patient appears  stated age and is in NAD. HEENT:  Normocephalic, atraumatic.  The mucous membranes are moist. The superficial temporal arteries are without ropiness or tenderness. CV:  RRR Lungs:  CTAB Neck/HEME:  There are no carotid bruits bilaterally.  Neurological examination:  Orientation: The patient is alert and oriented x3.  Cranial nerves: There is good facial symmetry.  Extraocular muscles are intact. The visual fields are full to confrontational testing. The speech is fluent and clear. Soft palate rises symmetrically and there is no tongue deviation. Hearing is intact to conversational tone. Sensation: Sensation is intact to light touch throughout (facial, trunk, extremities). Vibration is intact at the bilateral big toe. There is no extinction with double simultaneous stimulation.  Motor: Strength is 5/5 in the bilateral upper and lower extremities.   Shoulder shrug is equal and symmetric.  There is no pronator drift. Deep tendon reflexes: Deep tendon reflexes are 2/4 at the bilateral biceps, triceps, brachioradialis, patella and achilles. Plantar responses are downgoing bilaterally.  Movement examination: Tone: There is normal tone in the bilateral upper extremities.  The tone in the lower extremities is normal.  Abnormal movements: there are mild fasciculations in bilateral gastroc and in the left quadriceps.  No fasciculations are noted across the chest, back, tongue, upper extremities bilaterally.  There is no rest tremor, even with distraction procedures.  There is rare postural tremor on the left, only if he is given a weight.  There is minimal intention tremor.  He really does not have much trouble with pouring water from 1 glass to another. Coordination:  There is no decremation with RAM's, with any form of RAMS, including alternating supination and pronation of the forearm, hand opening and closing, finger taps, heel taps and toe taps. Gait and Station: The patient pushes off to arise.  There  is no shuffling.  We are in a slightly small space (exam room because he was disrobed, so not a lot of place to walk) but he appears to have a slightly  dropfoot on the left. I have reviewed and interpreted the following labs independently   Chemistry      Component Value Date/Time   CREATININE 1.70 (H) 04/01/2021 1021      Component Value Date/Time   ALKPHOS 61 03/29/2021 1009   AST 24 03/29/2021 1009   ALT 28 03/29/2021 1009   BILITOT 0.3 03/29/2021 1009      Lab Results  Component Value Date   TSH 1.720 10/13/2021   No results found for: "WBC", "HGB", "HCT", "MCV", "PLT"  No results found for: "VITAMINB12"  No results found for: "IRON", "TIBC", "FERRITIN"    Total time spent on today's visit was 70  minutes, including both face-to-face time and nonface-to-face time.  Time included that spent on review of records (prior notes available to me/labs/imaging if pertinent), discussing treatment and goals, answering patient's questions and coordinating care.  Cc:  Celene Squibb, MD

## 2021-11-29 ENCOUNTER — Encounter: Payer: Self-pay | Admitting: Neurology

## 2021-11-29 ENCOUNTER — Other Ambulatory Visit (INDEPENDENT_AMBULATORY_CARE_PROVIDER_SITE_OTHER): Payer: Medicare Other

## 2021-11-29 ENCOUNTER — Ambulatory Visit (INDEPENDENT_AMBULATORY_CARE_PROVIDER_SITE_OTHER): Payer: Medicare Other | Admitting: Neurology

## 2021-11-29 VITALS — HR 68 | Wt 220.8 lb

## 2021-11-29 DIAGNOSIS — E538 Deficiency of other specified B group vitamins: Secondary | ICD-10-CM

## 2021-11-29 DIAGNOSIS — R253 Fasciculation: Secondary | ICD-10-CM | POA: Diagnosis not present

## 2021-11-29 DIAGNOSIS — M5416 Radiculopathy, lumbar region: Secondary | ICD-10-CM | POA: Diagnosis not present

## 2021-11-29 DIAGNOSIS — D519 Vitamin B12 deficiency anemia, unspecified: Secondary | ICD-10-CM | POA: Diagnosis not present

## 2021-11-29 DIAGNOSIS — G8929 Other chronic pain: Secondary | ICD-10-CM

## 2021-11-29 DIAGNOSIS — M545 Low back pain, unspecified: Secondary | ICD-10-CM

## 2021-11-29 LAB — VITAMIN B12: Vitamin B-12: 357 pg/mL (ref 211–911)

## 2021-11-29 LAB — FERRITIN: Ferritin: 52.4 ng/mL (ref 22.0–322.0)

## 2021-11-29 NOTE — Patient Instructions (Signed)
Your provider has requested that you have labwork completed today. The lab is located on the Second floor at [address], within the [facility] office. When you get off the elevator, turn right and go in the [facility] [address]; the first brown door on the left.  Tell the ladies behind the desk that you are there for lab work. If you are not called within 15 minutes please check with the front desk.   Once you complete your labs you are free to go. You will receive a call or message via MyChart with your lab results.    

## 2021-11-29 NOTE — Telephone Encounter (Signed)
Do you have any suggestions? Pt seen you time before last.

## 2021-11-29 NOTE — Telephone Encounter (Signed)
Per pt, he never started the medication due to cost.

## 2021-12-04 ENCOUNTER — Ambulatory Visit
Admission: RE | Admit: 2021-12-04 | Discharge: 2021-12-04 | Disposition: A | Discharge status: Home / Self Care | Payer: Medicare Other | Source: Ambulatory Visit | Attending: Neurology | Admitting: Neurology

## 2021-12-04 DIAGNOSIS — M5416 Radiculopathy, lumbar region: Secondary | ICD-10-CM

## 2021-12-06 ENCOUNTER — Encounter (HOSPITAL_COMMUNITY): Payer: Self-pay

## 2021-12-06 ENCOUNTER — Ambulatory Visit (HOSPITAL_COMMUNITY)
Admission: RE | Admit: 2021-12-06 | Discharge: 2021-12-06 | Disposition: A | Discharge status: Home / Self Care | Payer: Medicare Other | Source: Ambulatory Visit | Attending: Gastroenterology | Admitting: Gastroenterology

## 2021-12-06 DIAGNOSIS — R748 Abnormal levels of other serum enzymes: Secondary | ICD-10-CM | POA: Insufficient documentation

## 2021-12-06 DIAGNOSIS — K8689 Other specified diseases of pancreas: Secondary | ICD-10-CM | POA: Diagnosis present

## 2021-12-06 LAB — POCT I-STAT CREATININE: Creatinine, Ser: 1.8 mg/dL — ABNORMAL HIGH (ref 0.61–1.24)

## 2021-12-06 MED ORDER — IOHEXOL 300 MG/ML  SOLN
100.0000 mL | Freq: Once | INTRAMUSCULAR | Status: AC | PRN
  Administered 2021-12-06: 80 mL via INTRAVENOUS

## 2021-12-09 ENCOUNTER — Telehealth: Payer: Self-pay | Admitting: Cardiology

## 2021-12-09 NOTE — Telephone Encounter (Signed)
Wife states that on 12/06/21, patient had a CT abdomen w/wo contrast. States that she was told that everything was fine but there was some plaque buildup. Wants to know if the patient needs to follow up with Dr. Harl Bowie or is this something that they should not worry about. Please advise.

## 2021-12-09 NOTE — Telephone Encounter (Signed)
Wife called wanting to discuss results of CT Abdomen test.

## 2021-12-13 NOTE — Telephone Encounter (Signed)
Patient's wife informed and verbalized understanding of plan. 

## 2021-12-13 NOTE — Telephone Encounter (Signed)
Wife called again wanting to discuss results of abdomen testing from [facility].

## 2021-12-13 NOTE — Telephone Encounter (Signed)
Given age not surprising to have come artery calcification, he is on good medications to prevent  it from worsening. In absence of any chest paisn would not require further testing  Zandra Abts MD

## 2021-12-21 ENCOUNTER — Ambulatory Visit: Payer: Medicare Other | Admitting: Gastroenterology

## 2021-12-24 ENCOUNTER — Ambulatory Visit (INDEPENDENT_AMBULATORY_CARE_PROVIDER_SITE_OTHER): Payer: Medicare Other | Admitting: Gastroenterology

## 2021-12-24 ENCOUNTER — Encounter: Payer: Self-pay | Admitting: Gastroenterology

## 2021-12-24 DIAGNOSIS — K8681 Exocrine pancreatic insufficiency: Secondary | ICD-10-CM | POA: Insufficient documentation

## 2021-12-24 MED ORDER — ZENPEP 40000-126000 UNITS PO CPEP: ORAL_CAPSULE | ORAL | 3 refills | Status: DC

## 2021-12-24 NOTE — Patient Instructions (Signed)
Finish out your Zenpep samples and then take a two week break. If you have recurrent bowel issues/gas/bloating etc, then you can resume medication. I have provided you with additional samples of Zenpep today. Make sure if you have to restart Zenpep that you take 2 with meals and 1 with snacks, Take with first bite of food for it to be effective.  I will send in RX for Zenpep to see what the cost will be. I will get back to you on further information regarding Jardiance and any effects on the pancreas.

## 2021-12-24 NOTE — Progress Notes (Signed)
GI Office Note    Referring Provider: Celene Squibb, MD Primary Care Physician:  Celene Squibb, MD  Primary Gastroenterologist: Garfield Cornea, MD   Chief Complaint   Chief Complaint  Patient presents with   Follow-up    Doing well.     History of Present Illness   Glen Guerrero. is a 76 y.o. male presenting today for follow-up.  Last seen in June 2023.  He has a history of small bowel obstruction in 2022 while on vacation in [place], transition point in the right lower quadrant.  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  Completed CT enterography to further evaluate causes of small bowel obstruction.  No significant findings seen.  Obstruction felt to be related to adhesions.  Repeat lipase was mildly elevated at 98.  At last office visit he complained of several weeks of bad diarrhea, uncontrollable gas.  New medication changes of propanolol which she had been on for just a few weeks and Lyrica which she had been on for a couple of months.  Tried Gas-X, Imodium, IBgard but only a couple of times.  Overall feeling unwell, complaining of sweats at night.  Wore a heart monitor for cardiology, unremarkable.  Had not taken his sugars when he was sweating, reporting his Dexcom alerts to low sugars but he turned off the alarm for higher sugars.  In June 2023, C. difficile and GI profile negative negative.  Fecal pancreatic elastase 145.  TSH and free T4 normal, celiac serologies negative, lipase normal 43.  CT abdomen without contrast to evaluate pancreas, August 2023, showed no pancreatic abnormalities. He had hepatic steatosis and descending colon diverticulosis.   Patient states he is feeling a lot better but he is not sure if it from the Zenpep or his change in diet or related to stopping propranolol. He feels like stopping the propranolol help  his stools. BMs back to normal. This occurred before started the Zenpep. His gas is better as well. He has been on Zenpep samples for 2 weeks. Unable to afford Creon. At the same time, he also started cleaning up his diet with goal of better glycemic control. His nephrologist gave him a scare. He has recently started a trial of new medication, finerenone.   He has noted when sugars are high he feels bad, this has improved with better diet and taking berberine. Also states there was a question of potential negative impact on his pancreas from [place]. He says his nephrologist does not like him on it but PCP recommends. He wants our opinion. Patient has not seen endocrinologist in the past.       Medications   Current Outpatient Medications  Medication Sig Dispense Refill   amLODipine (NORVASC) 10 MG tablet Take 10 mg by mouth daily.     aspirin EC 81 MG tablet Take 81 mg by mouth daily.     atorvastatin (LIPITOR) 20 MG tablet Take 20 mg by mouth daily.     BERBERINE CHLORIDE PO Take 600 mg by mouth 2 (two) times daily.     Biotin 10000 MCG TABS Take by mouth.     calcium carbonate (OSCAL) 1500 (600 Ca) MG TABS tablet Take by mouth 2 (two) times daily with a meal.     CHERRY PO Take 100 mg by mouth 2 (two)  times daily.     Cholecalciferol 25 MCG (1000 UT) capsule Vitamin D3 25 mcg (1,000 unit) capsule  TAKE 1 CAPSULE (1,000 UNITS TOTAL) BY MOUTH ONE TIME EACH DAY.     ezetimibe (ZETIA) 10 MG tablet Take 10 mg by mouth daily.     fenofibrate micronized (LOFIBRA) 134 MG capsule Take by mouth daily.     fexofenadine (ALLEGRA) 180 MG tablet Take 180 mg by mouth daily as needed.     Finerenone 10 MG TABS Take by mouth.     fluorouracil (EFUDEX) 5 % cream Apply 1 application topically as needed.      Gabapentin Enacarbil (HORIZANT) 600 MG TBCR Horizant ER 600 mg tablet,extended release   600 mg every day by oral route.     glipiZIDE (GLUCOTROL) 10 MG tablet Take 10 mg by mouth daily before  breakfast.      hydrALAZINE (APRESOLINE) 50 MG tablet Take 50 mg by mouth 2 (two) times daily.     JARDIANCE 25 MG TABS tablet Take 25 mg by mouth daily.  1   LEVEMIR FLEXTOUCH 100 UNIT/ML Pen Inject 40 Units into the skin 2 (two) times daily.     NON FORMULARY Uses CPAP nightly     NOVOLOG FLEXPEN 100 UNIT/ML FlexPen SMARTSIG:8 Unit(s) SUB-Q 3 Times Daily     pregabalin (LYRICA) 50 MG capsule Take 50 mg by mouth in the morning and at bedtime.     rOPINIRole (REQUIP) 1 MG tablet Take 1 mg by mouth 2 (two) times daily. Take 1 mg at 4:00pm  and take 2 mg at night     valsartan (DIOVAN) 40 MG tablet Take 40 mg by mouth daily.     zolpidem (AMBIEN) 5 MG tablet Take 5 mg by mouth at bedtime as needed.     No current facility-administered medications for this visit.    Allergies   Allergies as of 12/24/2021 - Review Complete 12/24/2021  Allergen Reaction Noted   Mirapex  [pramipexole dihydrochloride] Other (See Comments) 02/12/2019   Other Other (See Comments) 02/12/2019   Pramipexole Other (See Comments) 02/26/2019   Ciprofloxacin Nausea Only 05/19/2014   Flagyl [metronidazole] Other (See Comments) 05/19/2014   Ibuprofen  05/31/2019   Propranolol Diarrhea 10/09/2021   Tramadol Nausea And Vomiting 10/24/2017     Review of Systems   General: Negative for anorexia, weight loss, fever, chills, fatigue, weakness. ENT: Negative for hoarseness, difficulty swallowing , nasal congestion. CV: Negative for chest pain, angina, palpitations, dyspnea on exertion, peripheral edema.  Respiratory: Negative for dyspnea at rest, dyspnea on exertion, cough, sputum, wheezing.  GI: See history of present illness. GU:  Negative for dysuria, hematuria, urinary incontinence, urinary frequency, nocturnal urination.  Endo: Negative for unusual weight change.     Physical Exam   BP 131/70 (BP Location: Left Arm, Patient Position: Sitting, Cuff Size: Normal)   Pulse 71   Temp (!) 97.5 F (36.4 C) (Temporal)    Ht 6' (1.829 m)   Wt 215 lb (97.5 kg)   SpO2 96%   BMI 29.16 kg/m    General: Well-nourished, well-developed in no acute distress.  Eyes: No icterus. Mouth: Oropharyngeal mucosa moist and pink , no lesions erythema or exudate. Lungs: Clear to auscultation bilaterally.  Heart: Regular rate and rhythm, no murmurs rubs or gallops.  Abdomen: Bowel sounds are normal, nontender, nondistended, no hepatosplenomegaly or masses,  no abdominal bruits or hernia , no rebound or guarding.  Rectal: not performed Extremities: No lower extremity  edema. No clubbing or deformities. Neuro: Alert and oriented x 4   Skin: Warm and dry, no jaundice.   Psych: Alert and cooperative, normal mood and affect.  Labs   12/20/21: WBC 4900, Hgb 14.9, Platelets 221000, glucose 172, creatinine 1.43, bun 31, alb 4.4,  Imaging Studies   CT ABDOMEN W WO CONTRAST  Result Date: 12/07/2021 CLINICAL DATA:  Pancreatic insufficiency, elevated lipase, rule out chronic pancreatitis or occult malignancy EXAM: CT ABDOMEN WITHOUT AND WITH CONTRAST TECHNIQUE: Multidetector CT imaging of the abdomen was performed following the standard protocol before and following the bolus administration of intravenous contrast. RADIATION DOSE REDUCTION: This exam was performed according to the departmental dose-optimization program which includes automated exposure control, adjustment of the mA and/or kV according to patient size and/or use of iterative reconstruction technique. CONTRAST:  33m OMNIPAQUE IOHEXOL 300 MG/ML  SOLN COMPARISON:  04/01/2021 FINDINGS: Lower chest: No acute abnormality.  Coronary artery calcifications. Hepatobiliary: No solid liver abnormality is seen. Hepatic steatosis. No gallstones, gallbladder wall thickening, or biliary dilatation. Pancreas: Unremarkable. No pancreatic ductal dilatation or surrounding inflammatory changes. Spleen: Normal in size without significant abnormality. Adrenals/Urinary Tract: Adrenal glands are  unremarkable. Simple, benign bilateral renal cortical cysts, for which no further follow-up or characterization is required. Bilateral renal cortical scarring, consistent with prior obstructive, infectious, or ischemic insult. Stomach/Bowel: Stomach is within normal limits. No evidence of bowel wall thickening, distention, or inflammatory changes. Descending colonic diverticulosis. Vascular/Lymphatic: Aortic atherosclerosis. No enlarged abdominal lymph nodes. Other: No abdominal wall hernia or abnormality. No ascites. Musculoskeletal: No acute or significant osseous findings. IMPRESSION: 1. No CT abnormality of the pancreas. No pancreatic ductal dilatation, parenchymal atrophy, or inflammatory findings. 2. Hepatic steatosis. 3. Descending colonic diverticulosis without evidence of acute diverticulitis. 4. Coronary artery disease. Aortic Atherosclerosis (ICD10-I70.0). Electronically Signed   By: ADelanna AhmadiM.D.   On: 12/07/2021 14:14   MR LUMBAR SPINE WO CONTRAST  Result Date: 12/06/2021 CLINICAL DATA:  Low back pain with weakness and balance issues for 1 year. No known injury or prior relevant surgery. 70% relief from back injection 1 year ago. History prostate cancer. EXAM: MRI LUMBAR SPINE WITHOUT CONTRAST TECHNIQUE: Multiplanar, multisequence MR imaging of the lumbar spine was performed. No intravenous contrast was administered. COMPARISON:  Lumbar MRI 10/21/2020 FINDINGS: Segmentation: Conventional anatomy assumed, with the last open disc space designated L5-S1. Alignment:  Physiologic. Vertebrae: No worrisome osseous lesion, acute fracture or pars defect. The visualized sacroiliac joints appear unremarkable. Conus medullaris: Extends to the L1 level and appears normal. Paraspinal and other soft tissues: No significant paraspinal findings. Partially imaged renal cysts are demonstrated bilaterally, which do not require any specific imaging follow-up. Disc levels: T11-12: Sagittal images demonstrate a small  left paracentral disc protrusion without mass effect on the cord or foraminal compromise. T12-L1: Normal interspace. L1-2: Normal interspace. L2-3: Mild disc bulging and facet hypertrophy. No significant spinal stenosis or nerve root encroachment. L3-4: Mild disc bulging with mild facet and ligamentous hypertrophy. No significant spinal stenosis or nerve root encroachment. L4-5: Loss of disc height with annular disc bulging and endplate osteophytes asymmetric to the right. Moderate facet and ligamentous hypertrophy. The spinal canal and lateral recesses remain patent. Moderate right foraminal narrowing with possible right L4 nerve root encroachment. Mild left foraminal narrowing. L5-S1: Mild disc bulging with endplate osteophytes and facet hypertrophy asymmetric to the left. The spinal canal, lateral recesses and right foramen are widely patent. Mild left foraminal narrowing. IMPRESSION: 1. Overall findings are similar to the  previous study from 14 months ago. No acute findings are identified. 2. Moderate right foraminal narrowing at L4-5 secondary to asymmetric disc bulging, endplate osteophytes and facet hypertrophy. Possible resulting chronic right L4 nerve root encroachment. 3. No significant spinal stenosis. Electronically Signed   By: Richardean Sale M.D.   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 Digestive issues are better but unclear if related to stopped propranolol versus Zenpep as some of his symptoms improved before Zenpep. He symptoms are likely better in the setting of better glycemic control. He has been cleaning up his diet.     PLAN   Complete Zenpep samples he has now. Then take a 2 week break, to see if making a difference in bowel issues or overall how he feels. If recurrent symptoms, then we will continue Zenpep enzymes. I have sent in RX to check on the price for him. Will discuss with drug rep this week if we run  into any access issues.  We will look into Jardiance further regarding his concerns about affect on the pancreas. Further recommendations to follow.   Laureen Ochs. Bobby Rumpf, [place], [facility]

## 2021-12-28 ENCOUNTER — Ambulatory Visit (INDEPENDENT_AMBULATORY_CARE_PROVIDER_SITE_OTHER): Payer: Medicare Other | Admitting: Neurology

## 2021-12-28 DIAGNOSIS — R253 Fasciculation: Secondary | ICD-10-CM | POA: Diagnosis not present

## 2021-12-28 DIAGNOSIS — G629 Polyneuropathy, unspecified: Secondary | ICD-10-CM

## 2021-12-28 NOTE — Procedures (Signed)
[facility]  [address], [address], [place] [address] Tel: [phone] Fax:  [phone] Test Date:  12/28/2021  Patient: Glen Guerrero, Glen Guerrero. DOB: 1946/04/05 Physician: Narda Amber, DO  Sex: Male Height: 6' " Ref Phys: Alonza Bogus, D.O.  ID#: [id number]   Technician:    Patient Complaints: This is a 76 year-old man referred for evaluation of imbalance and muscle twitches.  NCV & EMG Findings: Extensive electrodiagnostic testing of the right lower extremity and additional studies of the left shows: Bilateral sural and superficial peroneal sensory responses are absent. Left peroneal motor response is absent at the extensor digitorum brevis, and normal at the tibialis anterior.  Right peroneal and bilateral tibial motor responses are within normal limits. Bilateral tibial H reflexes studies are within normal limits. Chronic motor axon loss changes are seen affecting the muscles below the knee and right rectus femoris muscle.  There is no evidence of active motor axon loss changes. Rare fasciculations are seen in bilateral gastrocnemius muscles.   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  There is no evidence of a widespread disorder or anterior horn cells.    ___________________________ Narda Amber, DO    Nerve Conduction Studies Anti Sensory Summary Table   Stim Site NR Peak (ms) Norm Peak (ms) O-P Amp (V) Norm O-P Amp  Left Sup Peroneal Anti Sensory (Ant Lat Mall)  32C  12 cm NR  <4.6  >3  Right Sup Peroneal Anti Sensory (Ant Lat Mall)  32C  12 cm NR  <4.6  >3  Left Sural Anti Sensory (Lat Mall)  32C  Calf NR  <4.6  >3  Right Sural Anti Sensory (Lat Mall)  32C  Calf NR  <4.6  >3   Motor Summary Table   Stim Site NR Onset (ms) Norm Onset (ms) O-P Amp (mV) Norm O-P Amp Site1 Site2 Delta-0 (ms) Dist (cm) Vel (m/s) Norm  Vel (m/s)  Left Peroneal Motor (Ext Dig Brev)  32C  Ankle NR  <6.0  >2.5 B Fib Ankle  0.0  >40  B Fib NR     Poplt B Fib  0.0  >40  Poplt NR            Right Peroneal Motor (Ext Dig Brev)  32C  Ankle    3.3 <6.0 4.1 >2.5 B Fib Ankle 9.6 39.0 41 >40  B Fib    12.9  3.8  Poplt B Fib 1.9 8.0 42 >40  Poplt    14.8  3.7         Left Peroneal TA Motor (Tib Ant)  32C  Fib Head    3.0 <4.5 4.5 >3 Poplit Fib Head 1.7 7.0 41 >40  Poplit    4.7  4.5         Left Tibial Motor (Abd Hall Brev)  32C  Ankle    3.8 <6.0 5.8 >4 Knee Ankle 10.7 43.0 40 >40  Knee    14.5  3.5         Right Tibial Motor (Abd Hall Brev)  32C  Ankle    4.2 <6.0 5.6 >4 Knee Ankle 10.6 42.0 40 >40  Knee    14.8  2.9          H Reflex Studies   NR H-Lat (ms) Lat Norm (ms) L-R H-Lat (ms)  Left Tibial (Gastroc)  32C  38.37 <35 0.00  Right Tibial (Gastroc)  32C     38.37 <35 0.00   EMG   Side Muscle Ins Act Fibs Fasc Recrt Dur. Amp. Poly. Activation Comment  Right BicepsFemS Nml Nml Nml Nml Nml Nml Nml Nml N/A  Right Gastroc Nml Nml 1+ 1- 1+ 1+ 1+ Nml N/A  Right Flex Dig Long Nml Nml Nml 1- 1+ 1+ 1+ Nml N/A  Right RectFemoris Nml Nml Nml 1- 1+ 1+ 1+ Nml N/A  Right GluteusMed Nml Nml Nml Nml Nml Nml Nml Nml N/A  Right AntTibialis Nml Nml Nml 1- 1+ 1+ 1+ Nml N/A  Right AdductorLong Nml Nml Nml Nml Nml Nml Nml Nml N/A  Left Flex Dig Long Nml Nml Nml 1- 1+ 1+ 1+ Nml N/A  Left Gastroc Nml Nml 1+ 1- 1+ 1+ 1+ Nml N/A  Left RectFemoris Nml Nml Nml Nml Nml Nml Nml Nml N/A  Left BicepsFemS Nml Nml Nml Nml Nml Nml Nml Nml N/A  Left AntTibialis Nml Nml Nml 1- 1+ 1+ 1+ Nml N/A      Waveforms:

## 2022-01-17 ENCOUNTER — Ambulatory Visit (INDEPENDENT_AMBULATORY_CARE_PROVIDER_SITE_OTHER): Payer: Medicare Other | Admitting: Physician Assistant

## 2022-01-17 ENCOUNTER — Telehealth: Payer: Self-pay

## 2022-01-17 VITALS — BP 145/80 | HR 67

## 2022-01-17 DIAGNOSIS — N401 Enlarged prostate with lower urinary tract symptoms: Secondary | ICD-10-CM | POA: Diagnosis not present

## 2022-01-17 DIAGNOSIS — R339 Retention of urine, unspecified: Secondary | ICD-10-CM | POA: Diagnosis not present

## 2022-01-17 DIAGNOSIS — Z8546 Personal history of malignant neoplasm of prostate: Secondary | ICD-10-CM

## 2022-01-17 DIAGNOSIS — C61 Malignant neoplasm of prostate: Secondary | ICD-10-CM | POA: Diagnosis not present

## 2022-01-17 DIAGNOSIS — N138 Other obstructive and reflux uropathy: Secondary | ICD-10-CM | POA: Diagnosis not present

## 2022-01-17 MED ORDER — TAMSULOSIN HCL 0.4 MG PO CAPS: 0.4000 mg | ORAL_CAPSULE | Freq: Every day | ORAL | 1 refills | Status: DC

## 2022-01-17 NOTE — Progress Notes (Unsigned)
Fill and Pull Catheter Removal  Patient is present today for a catheter removal.  Patient was cleaned and prepped in a sterile fashion 300 ml of sterile water/ saline was instilled into the bladder when the patient felt the urge to urinate. 10 ml of water was then drained from the balloon.  A 16FR foley cath was removed from the bladder no complications were noted .  Patient as then given some time to void on their own.  Patient can void  225 ml on their own after some time.  Patient tolerated well.  Performed by: Marisue Brooklyn, CMA  Follow up/ Additional notes: Follow up this afternoon if patient can not void    Patient was able to void 4 times since leaving office this morning. Will follow up with patient to make sure he is able to void without any complication.

## 2022-01-17 NOTE — Patient Instructions (Signed)
Start tamsulosin daily as prescribed

## 2022-01-17 NOTE — Telephone Encounter (Signed)
Call to follow up with patient after voiding trial. Patient voiced that he was able to void about 4 times since leaving the office this morning. Made patient aware that I will follow up with him later this evening to make sure he is still voiding properly and no complications. Patient voiced understanding.

## 2022-01-17 NOTE — Progress Notes (Unsigned)
Assessment: There are no diagnoses linked to this encounter.   Plan: ***  Chief Complaint: No chief complaint on file.   HPI: Glen Guerrero is a 76 y.o. 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 Pt presented to the ED in [facility] with c/o inability to have a bowel movement and was found to have significant fecal impaction which was treated in the ED.  At that time, he was having difficulty voiding and was found to be in retention.  Foley catheter placed and prescription for Flomax given.  The patient did not fill the prescription.  He reports no urinary symptoms prior to this episode of constipation and has never had an issue with retention in the past.  1 day of hematuria noted after Foley placement and meatal discomfort at this time, but no lower abdominal pain, fever, chills, urinary burning.  Constipation has improved on MiraLAX.  08/26/21 I have prostate cancer. HPI: Glen Guerrero is a 76 year-old male established patient who is here evaluation for treatment of prostate cancer.  His prostate cancer was diagnosed 01/04/2019. He does have the pathology report from his biopsy. His PSA at his time of diagnosis was 3.7.    08/26/21: Glen Guerrero today in f/u for the history below.  His PSA is stable at 0.2.  His T is up slightly to 104.  He is voiding ok with an IPSS of 8 and nocturia x 2.  He has no hot flashes.  His weight is up some.  He has good energy and strength particularly over the last month.  He has persistent ED.  He has had no hematuria.   He has no GI complaints.   He no bone pain.     02/25/21: Glen Guerrero Guerrero for the history noted below.  His PSA on 02/05/21 was 0.2 which is up from <0.1 in 4/22.  The testosterone remains low at 85.  He last had Eligard '30mg'$  on 08/09/19.  He completed EXRT in 2/21.  He had T2a GG3 prostate cancer on his  pretreatment biopsy.  He is voiding wel but has nocturia x 2 and some intermittency.  His IPSS is 5.  He is taking ambien for sleep issues related to restless leg.  He has lost a couple of lbs.  He has no GI complaints but he had a SBO 5 weeks ago that resolved with conservative management.  He has ED and didn't have a good response to PDE5's.   He had headaches with it.   He is not having hot flashes but he has malaise in the afternoon and has chronic myalgias.     He has been seen at the [facility] for his T2a Gleason 7(4+3) prostate cancer. He has elected to proceed with EXRT with adjuvanct ADT for 6-8 months. He will be getting his EXRT at [facility] and I will await their input regarding the need for SpaceOAR and fiducials. He was initially seen for BPH with BOO and was found to have an 65m right base nodule with a PSA of 3.7 which was from 10/19 prior to the initiation of finasteride which he is currently taking. He had a biopsy that demonstrated a 332mprostate with a T2a N0 M0 Gleason 7(4+3) prostate cancer. The right base and mid lateral cores had Gleason 7(4+3) up to 90% involvement. The remaining 4 right cores had  7(3+4) disease up to 85% involvement. 3/6 left cores had low volume Gleason 6 disease. The bone scan is negative and a CT showed some small subpleural nodules favored to be nodes but no other evidence of metastatic disease. His comorbidities include HTN, hyperlipidemia, and diabetes. His voiding symptoms have improved since his initial visit.   MSKCC nomogram predicts. 28% OCD, 69% ECE, 19% LNI and 20% SVI.   Portions of the above documentation were copied from a prior visit for review purposes only.  Allergies: Allergies  Allergen Reactions   Mirapex  [Pramipexole Dihydrochloride] Other (See Comments)   Other Other (See Comments)   Pramipexole Other (See Comments)   Ciprofloxacin Nausea Only    Lightheaded   Flagyl [Metronidazole] Other (See Comments)    Can't remember symptoms    Ibuprofen    Propranolol Diarrhea   Tramadol Nausea And Vomiting    Can't take any medication similar to Tramadol    PMH: Past Medical History:  Diagnosis Date   Arthritis    Carpal tunnel syndrome    Chronic kidney disease, stage 3a (HCC)    Diverticulitis    DM type 2 (diabetes mellitus, type 2) ([place])    Gout    History of colon polyps    2015 polyp x 1 dysplastic and adenomatous pieces.   Hypercholesterolemia    Hypertension    Insomnia    Prostate cancer ([place])    Prostate enlargement    Restless leg syndrome    Seasonal allergies    Skin cancer     PSH: Past Surgical History:  Procedure Laterality Date   APPENDECTOMY     CARPAL TUNNEL RELEASE Left 08/12/2021   COLONOSCOPY  04/25/2013   Dr.Spainhour- polypectomy x 1 pieces dysplastic and adenomatous; Recc repeat in 2020 (5 years).   COLONOSCOPY N/A 12/05/2017   Procedure: COLONOSCOPY;  Surgeon: Daneil Dolin, MD;  [facility];  Service: Endoscopy;  Laterality: N/A;  11:15am   HERNIA REPAIR     inguinal x2   POLYPECTOMY  12/05/2017   Procedure: POLYPECTOMY;  Surgeon: Daneil Dolin, MD;  [facility];  Service: Endoscopy;;  colon    TONSILLECTOMY      SH: Social History   Tobacco Use   Smoking status: Former    Types: Cigars    Start date: 04/12/1969    Quit date: 04/25/2021    Years since quitting: 0.7   Smokeless tobacco: Former    Quit date: 05/20/1979   Tobacco comments:    5 cigars per week  Vaping Use   Vaping Use: Never used  Substance Use Topics   Alcohol use: Not Currently    Comment: shot of whiskey or wine four times per week but NONE in one year (09/2021)   Drug use: Yes    Comment: has a cookie/brownie with marijuana in it nightly that helps him sleep    ROS: All other review of systems were reviewed and are negative except what is noted above in HPI  PE: BP (!) 145/80   Pulse 67  GENERAL APPEARANCE:  Well appearing, well developed, well nourished,  NAD HEENT:  Atraumatic, normocephalic NECK:  Supple. Trachea midline ABDOMEN:  Soft, non-tender, no masses EXTREMITIES:  Moves all extremities well, without clubbing, cyanosis, or edema NEUROLOGIC:  Alert and oriented x 3, normal gait, CN II-XII grossly intact MENTAL STATUS:  appropriate BACK:  Non-tender to palpation, No CVAT SKIN:  Warm, dry, and intact   Results: Laboratory Data:  No results found for: "WBC", "HGB", "HCT", "MCV", "PLT"  Lab Results  Component Value Date   CREATININE 1.80 (H) 12/06/2021    Lab Results  Component Value Date   PSA <0.1 07/16/2019    Lab Results  Component Value Date   TESTOSTERONE 104 (L) 08/23/2021    No results found for: "HGBA1C"  Urinalysis    Component Value Date/Time   APPEARANCEUR Clear 08/26/2021 1334   GLUCOSEU 3+ (A) 08/26/2021 1334   BILIRUBINUR Negative 08/26/2021 1334   PROTEINUR 1+ (A) 08/26/2021 1334   UROBILINOGEN negative (A) 05/31/2019 1020   NITRITE Negative 08/26/2021 1334   LEUKOCYTESUR Negative 08/26/2021 1334    Lab Results  Component Value Date   LABMICR Comment 08/26/2021    Pertinent Imaging: No results found for this or any previous visit.  No results found for this or any previous visit.  No results found for this or any previous visit.  No results found for this or any previous visit.  Results for orders placed during the hospital encounter of 05/21/21  US RENAL  Narrative CLINICAL DATA:  Acute kidney failure with tubular necrosis.  EXAM: RENAL / URINARY TRACT ULTRASOUND COMPLETE  COMPARISON:  CT enterography 04/01/2021.  FINDINGS: Right Kidney:  Renal measurements: 13.4 x 5.9 x 6.7 cm = volume: 274 mL. Echogenicity within normal limits. No hydronephrosis. There is a 3.4 x 4.0 x 3.3 cm anechoic cyst in the superior pole. Cortical scarring again noted.  Left Kidney:  Renal measurements: 11.5 x 6.1 x 6.1 cm = volume: 223 mL. Echogenicity within normal limits. No hydronephrosis.  Cortical scarring again noted. There are 2 cysts identified in the mid and lower pole measuring 3.0 x 2.9 x 3.3 cm and 1.7 x 1.5 x 2.2 cm. There is a shadowing calculus in the mid kidney measuring 5 mm.  Bladder:  Appears normal for degree of bladder distention. Bilateral ureteral jets are visualized. Postvoid residual 52 cc.  Other:  None.  IMPRESSION: 1. No hydronephrosis. 2. Bilateral cortical scarring similar to prior CT. 3. 5 mm left renal calculus. 4. Bilateral renal cysts. 5. Postvoid residual in the urinary bladder measuring 52 cc.   Electronically Signed By: Ronney Asters M.D. On: 05/22/2021 21:36  No valid procedures specified. No results found for this or any previous visit.  No results found for this or any previous visit.  No results found for this or any previous visit (from the past 24 hour(s)).

## 2022-01-18 ENCOUNTER — Ambulatory Visit: Payer: Medicare Other | Admitting: Physician Assistant

## 2022-02-21 ENCOUNTER — Encounter: Payer: Self-pay | Admitting: Urology

## 2022-02-24 ENCOUNTER — Encounter: Payer: Self-pay | Admitting: Urology

## 2022-02-24 ENCOUNTER — Ambulatory Visit (INDEPENDENT_AMBULATORY_CARE_PROVIDER_SITE_OTHER): Payer: Medicare Other | Admitting: Urology

## 2022-02-24 VITALS — BP 111/68 | HR 59

## 2022-02-24 DIAGNOSIS — Z8546 Personal history of malignant neoplasm of prostate: Secondary | ICD-10-CM

## 2022-02-24 DIAGNOSIS — E291 Testicular hypofunction: Secondary | ICD-10-CM

## 2022-02-24 DIAGNOSIS — R339 Retention of urine, unspecified: Secondary | ICD-10-CM | POA: Diagnosis not present

## 2022-02-24 DIAGNOSIS — N401 Enlarged prostate with lower urinary tract symptoms: Secondary | ICD-10-CM

## 2022-02-24 DIAGNOSIS — R350 Frequency of micturition: Secondary | ICD-10-CM

## 2022-02-24 DIAGNOSIS — R351 Nocturia: Secondary | ICD-10-CM

## 2022-02-24 DIAGNOSIS — N138 Other obstructive and reflux uropathy: Secondary | ICD-10-CM

## 2022-02-24 LAB — URINALYSIS, ROUTINE W REFLEX MICROSCOPIC
Bilirubin, UA: NEGATIVE
Ketones, UA: NEGATIVE
Leukocytes,UA: NEGATIVE
Nitrite, UA: NEGATIVE
RBC, UA: NEGATIVE
Specific Gravity, UA: 1.015 (ref 1.005–1.030)
Urobilinogen, Ur: 0.2 mg/dL (ref 0.2–1.0)
pH, UA: 5.5 (ref 5.0–7.5)

## 2022-02-24 LAB — BLADDER SCAN AMB NON-IMAGING: Scan Result: 25

## 2022-02-24 NOTE — Progress Notes (Signed)
post void residual =25

## 2022-02-24 NOTE — Progress Notes (Signed)
Subjective:  1. History of prostate cancer   2. 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 Urinary retention   5. Nocturia   6. Urinary frequency     I have prostate cancer. HPI: Azzam Mehra is a 76 year-old male established patient who is here evaluation for treatment of prostate cancer.  His prostate cancer was diagnosed 01/04/2019. He does have the pathology report from his biopsy. His PSA at his time of diagnosis was 3.7.   02/24/22: Joseeduardo returns today in f/u.  He last had Eligard '30mg'$  on 08/09/19.  He completed EXRT in 2/21.  He had T2a GG3 prostate cancer on his pretreatment biopsy.   He had an episode of retention in September from constipation but is voiding well now with an IPSS of 7 and a PVR of 9m.  He had labs on 02/18/22 and his T is up to 155 and his PSA remains low at 0.2.  He had some hematuria with a UTI after the foley was removed and he had 3 rounds of antibiotics with the last dose last week. He is having some weight gain.  He has chronic back pain but no bone pain.  He is no GI issues today.   08/26/21: HJudeareturns today in f/u for the history below.  His PSA is stable at 0.2.  His T is up slightly to 104.  He is voiding ok with an IPSS of 8 and nocturia x 2.  He has no hot flashes.  His weight is up some.  He has good energy and strength particularly over the last month.  He has persistent ED.  He has had no hematuria.   He has no GI complaints.   He no bone pain.    02/25/21: Mr. DScottreturns for the history noted below.  His PSA on 02/05/21 was 0.2 which is up from <0.1 in 4/22.  The testosterone remains low at 85.  He last had Eligard '30mg'$  on 08/09/19.  He completed EXRT in 2/21.  He had T2a GG3 prostate cancer on his pretreatment biopsy.  He is voiding wel but has nocturia x 2 and some intermittency.  His IPSS is 5.  He is taking ambien for sleep issues related to restless leg.  He has lost a couple of lbs.  He has no GI complaints but he had a SBO 5  weeks ago that resolved with conservative management.  He has ED and didn't have a good response to PDE5's.   He had headaches with it.   He is not having hot flashes but he has malaise in the afternoon and has chronic myalgias.    [facility]. He has elected to proceed with EXRT with adjuvanct ADT for 6-8 months. [facility] the need for SpaceOAR and fiducials. He was initially seen for BPH with BOO and was found to have an 828mright base nodule with a PSA of 3.7 which was from 10/19 prior to the initiation of finasteride which he is currently taking. He had a biopsy that demonstrated a 323mrostate with a T2a N0 M0 Gleason 7(4+3) prostate cancer. The right base and mid lateral cores had Gleason 7(4+3) up to 90% involvement. The remaining 4 right cores had 7(3+4) disease up to 85% involvement. 3/6 left cores had low volume Gleason 6  disease. The bone scan is negative and a CT showed some small subpleural nodules favored to be nodes but no other evidence of metastatic disease. His comorbidities include HTN, hyperlipidemia, and diabetes. His voiding symptoms have improved since his initial visit.   MSKCC nomogram predicts. 28% OCD, 69% ECE, 19% LNI and 20% SVI.    IPSS     Row Name 02/24/22 1100         International Prostate Symptom Score   How often have you had the sensation of not emptying your bladder? Less than 1 in 5     How often have you had to urinate less than every two hours? About half the time     How often have you found you stopped and started again several times when you urinated? Not at All     How often have you found it difficult to postpone urination? Less than 1 in 5 times     How often have you had a weak urinary stream? Not at All     How often have you had to strain to start urination? Not at All     How many times did you typically get up at  night to urinate? 2 Times     Total IPSS Score 7       Quality of Life due to urinary symptoms   If you were to spend the rest of your life with your urinary condition just the way it is now how would you feel about that? Mostly Satisfied                 ROS:  ROS:  A complete review of systems was performed.  All systems are negative except for pertinent findings as noted above.     Allergies  Allergen Reactions   Mirapex  [Pramipexole Dihydrochloride] Other (See Comments)   Other Other (See Comments)   Pramipexole Other (See Comments)   Ciprofloxacin Nausea Only    Lightheaded   Flagyl [Metronidazole] Other (See Comments)    Can't remember symptoms   Ibuprofen    Propranolol Diarrhea   Tramadol Nausea And Vomiting    Can't take any medication similar to Tramadol    Outpatient Encounter Medications as of 02/24/2022  Medication Sig   amLODipine (NORVASC) 10 MG tablet Take 10 mg by mouth daily.   aspirin EC 81 MG tablet Take 81 mg by mouth daily.   atorvastatin (LIPITOR) 20 MG tablet Take 20 mg by mouth daily.   BERBERINE CHLORIDE PO Take 600 mg by mouth 2 (two) times daily.   Biotin 10000 MCG TABS Take by mouth.   calcium carbonate (OSCAL) 1500 (600 Ca) MG TABS tablet Take by mouth 2 (two) times daily with a meal.   CHERRY PO Take 100 mg by mouth 2 (two) times daily.   Cholecalciferol 25 MCG (1000 UT) capsule Vitamin D3 25 mcg (1,000 unit) capsule  TAKE 1 CAPSULE (1,000 UNITS TOTAL) BY MOUTH ONE TIME EACH DAY.   ezetimibe (ZETIA) 10 MG tablet Take 10 mg by mouth daily.   fenofibrate micronized (LOFIBRA) 134 MG capsule Take by mouth daily.   Finerenone 10 MG TABS Take by mouth.   fluorouracil (EFUDEX) 5 % cream Apply 1 application topically as needed.    Gabapentin Enacarbil (HORIZANT) 600 MG TBCR Horizant ER 600 mg tablet,extended release   600 mg every day by oral route.   glipiZIDE (GLUCOTROL) 10 MG tablet Take 10 mg by mouth daily before breakfast.  hydrALAZINE (APRESOLINE) 50 MG tablet Take 50 mg by mouth 2 (two) times daily.   JARDIANCE 25 MG TABS tablet Take 25 mg by mouth daily.   LEVEMIR FLEXTOUCH 100 UNIT/ML Pen Inject 40 Units into the skin 2 (two) times daily.   NON FORMULARY Uses CPAP nightly   NOVOLOG FLEXPEN 100 UNIT/ML FlexPen SMARTSIG:8 Unit(s) SUB-Q 3 Times Daily   Pancrelipase, Lip-Prot-Amyl, (ZENPEP) 40000-126000 units CPEP Take 2 capsules with meals and 1 capsule with snacks. Take with first bite of food.   pregabalin (LYRICA) 50 MG capsule Take 50 mg by mouth in the morning and at bedtime.   rOPINIRole (REQUIP) 1 MG tablet Take 1 mg by mouth 2 (two) times daily. Take 1 mg at 4:00pm  and take 2 mg at night   valsartan (DIOVAN) 40 MG tablet Take 40 mg by mouth daily.   zolpidem (AMBIEN) 5 MG tablet Take 5 mg by mouth at bedtime as needed.   [DISCONTINUED] fexofenadine (ALLEGRA) 180 MG tablet Take 180 mg by mouth daily as needed.   [DISCONTINUED] tamsulosin (FLOMAX) 0.4 MG CAPS capsule Take 1 capsule (0.4 mg total) by mouth daily.   No facility-administered encounter medications on file as of 02/24/2022.    Past Medical History:  Diagnosis Date   Arthritis    Carpal tunnel syndrome    Chronic kidney disease, stage 3a ([place])    Diverticulitis    DM type 2 (diabetes mellitus, type 2) ([place])    Gout    History of colon polyps    2015 polyp x 1 dysplastic and adenomatous pieces.   Hypercholesterolemia    Hypertension    Insomnia    Prostate cancer [facility])    Prostate enlargement    Restless leg syndrome    Seasonal allergies    Skin cancer     Past Surgical History:  Procedure Laterality Date   APPENDECTOMY     CARPAL TUNNEL RELEASE Left 08/12/2021   COLONOSCOPY  04/25/2013   Dr.Spainhour- polypectomy x 1 pieces dysplastic and adenomatous; Recc repeat in 2020 (5 years).   COLONOSCOPY N/A 12/05/2017   Procedure: COLONOSCOPY;  Surgeon: Daneil Dolin, MD;  [facility];  Service: Endoscopy;  Laterality:  N/A;  11:15am   HERNIA REPAIR     inguinal x2   POLYPECTOMY  12/05/2017   Procedure: POLYPECTOMY;  Surgeon: Daneil Dolin, MD;  [facility];  Service: Endoscopy;;  colon    TONSILLECTOMY      Social History   Socioeconomic History   Marital status: Married    Spouse name: Katharine Look   Number of children: 3   Years of education: Not on file   Highest education level: Not on file  Occupational History   Not on file  Tobacco Use   Smoking status: Former    Types: Cigars    Start date: 04/12/1969    Quit date: 04/25/2021    Years since quitting: 0.8   Smokeless tobacco: Former    Quit date: 05/20/1979   Tobacco comments:    5 cigars per week  Vaping Use   Vaping Use: Never used  Substance and Sexual Activity   Alcohol use: Not Currently    Comment: shot of whiskey or wine four times per week but NONE in one year (09/2021)   Drug use: Yes    Comment: has a cookie/brownie with marijuana in it nightly that helps him sleep   Sexual activity: Yes  Other Topics Concern   Not  on file  Social History Narrative   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   Right handed.    Social Determinants of Health   Financial Resource Strain: Not on file  Food Insecurity: Not on file  Transportation Needs: Not on file  Physical Activity: Not on file  Stress: Not on file  Social Connections: Not on file  Intimate Partner Violence: Not on file    Family History  Problem Relation Age of Onset   Hypertension Mother    Heart failure Mother    Prostate cancer Father    Hypertension Child    Hypertension Child    Hypertension Child    High Cholesterol Child    High Cholesterol Child    High Cholesterol Child    Cancer Paternal Aunt        unknown type   Cancer Paternal Aunt        unknown type   Colon cancer Neg Hx        Objective: Vitals:   02/24/22 1114  BP: 111/68  Pulse: (!) 59     Physical  Exam Vitals reviewed.  Constitutional:      Appearance: Normal appearance.  Neurological:     Mental Status: He is alert.     Lab Results:  Results for orders placed or performed in visit on 02/24/22 (from the past 24 hour(s))  Urinalysis, Routine w reflex microscopic     Status: Abnormal   Collection Time: 02/24/22 10:55 AM  Result Value Ref Range   Specific Gravity, UA 1.015 1.005 - 1.030   pH, UA 5.5 5.0 - 7.5   Color, UA Yellow Yellow   Appearance Ur Clear Clear   Leukocytes,UA Negative Negative   Protein,UA Trace (A) Negative/Trace   Glucose, UA 2+ (A) Negative   Ketones, UA Negative Negative   RBC, UA Negative Negative   Bilirubin, UA Negative Negative   Urobilinogen, Ur 0.2 0.2 - 1.0 mg/dL   Nitrite, UA Negative Negative   Narrative   Performed at:  [facility] [address], [place], [place]  [id number] Lab Director: [place], Phone:  [phone]       BMET No results for input(s): "NA", "K", "CL", "CO2", "GLUCOSE", "BUN", "CREATININE", "CALCIUM" in the last 72 hours. PSA PSA  Date Value Ref Range Status  07/16/2019 <0.1 < OR = 4.0 ng/mL Final    Comment:    The total PSA value from this assay system is  standardized against the WHO standard. The test  result will be approximately 20% lower when compared  to the equimolar-standardized total PSA (Beckman  Coulter). Comparison of serial PSA results should be  interpreted with this fact in mind. . This test was performed using the Siemens  chemiluminescent method. Values obtained from  different assay methods cannot be used interchangeably. PSA levels, regardless of value, should not be interpreted as absolute evidence of the presence or absence of disease.    Testosterone  Date Value Ref Range Status  08/23/2021 104 (L) 264 - 916 ng/dL Final    Comment:    Adult male reference interval is based on a population of healthy nonobese males (BMI <30) between 19 and 72 years  old. [place], [place] [phone]. PMID: [id number].   02/05/2021 85 (L) 264 - 916 ng/dL Final    Comment:    Adult male reference interval is based on a population of  healthy nonobese males (BMI <30) between 80 and 15 years old. [place], [place] [phone]. PMID: [id number].   08/20/2020 96 (L) 264 - 916 ng/dL Final    Comment:    Adult male reference interval is based on a population of healthy nonobese males (BMI <30) between 18 and 74 years old. [place], [place] [phone]. PMID: [id number].     Lab Results  Component Value Date   PSA1 0.2 08/23/2021     02/18/22: PSA 0.2 and T 155  UA is clear with 2+ glucose.   Studies/Results: .      Assessment & Plan: Prostate cancer.  His PSA is stable with a slight increase in the T.  He will return in 6 months with a PSA and testosterone.     BPH with BOO and Nocturia.  He had retention but that has resolved and he continues to void well off of the tamsulosin.   ED.  He failed oral therapy and is not interested in other treatment at this time.  .     No orders of the defined types were placed in this encounter.    Orders Placed This Encounter  Procedures   Urinalysis, Routine w reflex microscopic   PSA    Standing Status:   Future    Standing Expiration Date:   02/25/2023   Testosterone    Standing Status:   Future    Standing Expiration Date:   02/25/2023   BLADDER SCAN AMB NON-IMAGING      Return in about 6 months (around 08/25/2022) for with PSA and testosterone.   CC: Celene Squibb, MD      Irine Seal 02/25/2022 Patient ID: Wannetta Sender., male   DOB: [birth year], 76 y.o.   MRN: [id number] Patient ID: Yoshiaki Kreuser., male   DOB: December 01, 1945, 76 y.o.   MRN: [id number] Patient ID: Oluwadarasimi Redmon., male   DOB: 1945-05-25, 76 y.o.   MRN: [id number]

## 2022-03-03 ENCOUNTER — Ambulatory Visit: Payer: Medicare Other | Admitting: Urology

## 2022-05-26 HISTORY — PX: OTHER SURGICAL HISTORY: SHX169

## 2022-08-11 ENCOUNTER — Other Ambulatory Visit: Payer: Self-pay | Admitting: Urology

## 2022-08-12 LAB — TESTOSTERONE: Testosterone: 151 ng/dL — ABNORMAL LOW (ref 264–916)

## 2022-08-12 LAB — PSA: Prostate Specific Ag, Serum: 0.2 ng/mL (ref 0.0–4.0)

## 2022-08-25 ENCOUNTER — Ambulatory Visit (INDEPENDENT_AMBULATORY_CARE_PROVIDER_SITE_OTHER): Payer: Medicare Other | Admitting: Urology

## 2022-08-25 ENCOUNTER — Encounter: Payer: Self-pay | Admitting: Urology

## 2022-08-25 VITALS — BP 148/70 | HR 68 | Ht 72.0 in | Wt 220.0 lb

## 2022-08-25 DIAGNOSIS — N401 Enlarged prostate with lower urinary tract symptoms: Secondary | ICD-10-CM

## 2022-08-25 DIAGNOSIS — N138 Other obstructive and reflux uropathy: Secondary | ICD-10-CM

## 2022-08-25 DIAGNOSIS — Z8546 Personal history of malignant neoplasm of prostate: Secondary | ICD-10-CM

## 2022-08-25 DIAGNOSIS — R351 Nocturia: Secondary | ICD-10-CM

## 2022-08-25 DIAGNOSIS — N5201 Erectile dysfunction due to arterial insufficiency: Secondary | ICD-10-CM

## 2022-08-25 DIAGNOSIS — E291 Testicular hypofunction: Secondary | ICD-10-CM

## 2022-08-25 LAB — MICROSCOPIC EXAMINATION: Bacteria, UA: NONE SEEN

## 2022-08-25 LAB — URINALYSIS, ROUTINE W REFLEX MICROSCOPIC
Bilirubin, UA: NEGATIVE
Ketones, UA: NEGATIVE
Leukocytes,UA: NEGATIVE
Nitrite, UA: NEGATIVE
RBC, UA: NEGATIVE
Specific Gravity, UA: 1.015 (ref 1.005–1.030)
Urobilinogen, Ur: 0.2 mg/dL (ref 0.2–1.0)
pH, UA: 7 (ref 5.0–7.5)

## 2022-08-25 NOTE — Progress Notes (Signed)
Subjective:  1. History of prostate cancer   2. BPH with urinary obstruction   3. Nocturia   4. Hypogonadism in male   5. Erectile dysfunction due to arterial insufficiency     I have prostate cancer. HPI: Glen Guerrero is a 77 year-old male established patient who is here evaluation for treatment of prostate cancer.  His prostate cancer was diagnosed 01/04/2019. He does have the pathology report from his biopsy. His PSA at his time of diagnosis was 3.7.   08/25/22: Glen Guerrero returns today in f/u. HIs PSA is stable at 0.2 with a further increase in the T at 151.   He has stable LUTS with an IPSS of 9.  He has nocturia x 1 but intermittency prior to bed.  He has a reduced stream.  He has 3+ glucose on Jardiance.  He has no GI complaints.  He is gaining weight and is up 5-6 lbs.  He has no bone pain.   02/24/22: Glen Guerrero returns today in f/u.  He last had Eligard 30mg  on 08/09/19.  He completed EXRT in 2/21.  He had T2a GG3 prostate cancer on his pretreatment biopsy.   He had an episode of retention in September from constipation but is voiding well now with an IPSS of 7 and a PVR of 25ml.  He had labs on 02/18/22 and his T is up to 155 and his PSA remains low at 0.2.  He had some hematuria with a UTI after the foley was removed and he had 3 rounds of antibiotics with the last dose last week. He is having some weight gain.  He has chronic back pain but no bone pain.  He is no GI issues today.   08/26/21: Nadir returns today in f/u for the history below.  His PSA is stable at 0.2.  His T is up slightly to 104.  He is voiding ok with an IPSS of 8 and nocturia x 2.  He has no hot flashes.  His weight is up some.  He has good energy and strength particularly over the last month.  He has persistent ED.  He has had no hematuria.   He has no GI complaints.   He no bone pain.    02/25/21: Glen Guerrero returns for the history noted below.  His PSA on 02/05/21 was 0.2 which is up from <0.1 in 4/22.  The testosterone  remains low at 85.  He last had Eligard 30mg  on 08/09/19.  He completed EXRT in 2/21.  He had T2a GG3 prostate cancer on his pretreatment biopsy.  He is voiding wel but has nocturia x 2 and some intermittency.  His IPSS is 5.  He is taking ambien for sleep issues related to restless leg.  He has lost a couple of lbs.  He has no GI complaints but he had a SBO 5 weeks ago that resolved with conservative management.  He has ED and didn't have a good response to PDE5's.   He had headaches with it.   He is not having hot flashes but he has malaise in the afternoon and has chronic myalgias.    He has been seen at the [facility] for his T2a Gleason 7(4+3) prostate cancer. He has elected to proceed with EXRT with adjuvanct ADT for 6-8 months. He will be getting his EXRT at [facility] and I will await their input regarding the need for [employer] and fiducials. He was initially seen for BPH with BOO and was found  to have an 8mm right base nodule with a PSA of 3.7 which was from 10/19 prior to the initiation of finasteride which he is currently taking. He had a biopsy that demonstrated a 32ml prostate with a T2a N0 M0 Gleason 7(4+3) prostate cancer. The right base and mid lateral cores had Gleason 7(4+3) up to 90% involvement. The remaining 4 right cores had 7(3+4) disease up to 85% involvement. 3/6 left cores had low volume Gleason 6 disease. The bone scan is negative and a CT showed some small subpleural nodules favored to be nodes but no other evidence of metastatic disease. His comorbidities include HTN, hyperlipidemia, and diabetes. His voiding symptoms have improved since his initial visit.   MSKCC nomogram predicts. 28% OCD, 69% ECE, 19% LNI and 20% SVI.    IPSS     Row Name 08/25/22 1100         International Prostate Symptom Score   How often have you had the sensation of not emptying your bladder? Less than 1 in 5     How often have you had to urinate less than every two hours? Less than half the time      How often have you found you stopped and started again several times when you urinated? Less than half the time     How often have you found it difficult to postpone urination? Not at All     How often have you had a weak urinary stream? About half the time     How often have you had to strain to start urination? Not at All     How many times did you typically get up at night to urinate? 1 Time     Total IPSS Score 9       Quality of Life due to urinary symptoms   If you were to spend the rest of your life with your urinary condition just the way it is now how would you feel about that? Mostly Satisfied                 ROS:  ROS:  He has fatigue, night sweats, back and joint pain and easy bruising.     Allergies  Allergen Reactions   Mirapex  [Pramipexole Dihydrochloride] Other (See Comments)   Other Other (See Comments)   Pramipexole Other (See Comments)   Ciprofloxacin Nausea Only    Lightheaded   Flagyl [Metronidazole] Other (See Comments)    Can't remember symptoms   Ibuprofen    Propranolol Diarrhea   Tramadol Nausea And Vomiting    Can't take any medication similar to Tramadol    Outpatient Encounter Medications as of 08/25/2022  Medication Sig   amLODipine (NORVASC) 10 MG tablet Take 10 mg by mouth daily.   aspirin EC 81 MG tablet Take 81 mg by mouth daily.   atorvastatin (LIPITOR) 20 MG tablet Take 20 mg by mouth daily.   BERBERINE CHLORIDE PO Take 600 mg by mouth 2 (two) times daily.   Biotin 47829 MCG TABS Take by mouth.   calcium carbonate (OSCAL) 1500 (600 Ca) MG TABS tablet Take by mouth 2 (two) times daily with a meal.   CHERRY PO Take 100 mg by mouth 2 (two) times daily.   Cholecalciferol 25 MCG (1000 UT) capsule Vitamin D3 25 mcg (1,000 unit) capsule  TAKE 1 CAPSULE (1,000 UNITS TOTAL) BY MOUTH ONE TIME EACH DAY.   ezetimibe (ZETIA) 10 MG tablet Take 10 mg by mouth daily.  fenofibrate micronized (LOFIBRA) 134 MG capsule Take by mouth daily.    Finerenone 10 MG TABS Take by mouth.   fluorouracil (EFUDEX) 5 % cream Apply 1 application topically as needed.    Gabapentin Enacarbil (HORIZANT) 600 MG TBCR Horizant ER 600 mg tablet,extended release   600 mg every day by oral route.   glipiZIDE (GLUCOTROL) 10 MG tablet Take 10 mg by mouth daily before breakfast.    JARDIANCE 25 MG TABS tablet Take 25 mg by mouth daily.   LEVEMIR FLEXTOUCH 100 UNIT/ML Pen Inject 40 Units into the skin 2 (two) times daily.   NON FORMULARY Uses CPAP nightly   NOVOLOG FLEXPEN 100 UNIT/ML FlexPen SMARTSIG:8 Unit(s) SUB-Q 3 Times Daily   pregabalin (LYRICA) 50 MG capsule Take 50 mg by mouth in the morning and at bedtime.   rOPINIRole (REQUIP) 1 MG tablet Take 1 mg by mouth 2 (two) times daily. Take 1 mg at 4:00pm  and take 2 mg at night   valsartan (DIOVAN) 40 MG tablet Take 40 mg by mouth daily.   hydrALAZINE (APRESOLINE) 50 MG tablet Take 50 mg by mouth 2 (two) times daily.   zolpidem (AMBIEN) 5 MG tablet Take 5 mg by mouth at bedtime as needed.   [DISCONTINUED] Pancrelipase, Lip-Prot-Amyl, (ZENPEP) 40000-126000 units CPEP Take 2 capsules with meals and 1 capsule with snacks. Take with first bite of food.   No facility-administered encounter medications on file as of 08/25/2022.    Past Medical History:  Diagnosis Date   Arthritis    Carpal tunnel syndrome    Chronic kidney disease, stage 3a (HCC)    Diverticulitis    DM type 2 (diabetes mellitus, type 2) (HCC)    Gout    History of colon polyps    2015 polyp x 1 dysplastic and adenomatous pieces.   Hypercholesterolemia    Hypertension    Insomnia    Prostate cancer [facility])    Prostate enlargement    Restless leg syndrome    Seasonal allergies    Skin cancer     Past Surgical History:  Procedure Laterality Date   APPENDECTOMY     CARPAL TUNNEL RELEASE Left 08/12/2021   COLONOSCOPY  04/25/2013   Dr.Spainhour- polypectomy x 1 pieces dysplastic and adenomatous; Recc repeat in 2020 (5 years).    COLONOSCOPY N/A 12/05/2017   Procedure: COLONOSCOPY;  Surgeon: Corbin Ade, MD;  Location: [facility];  Service: Endoscopy;  Laterality: N/A;  11:15am   HERNIA REPAIR     inguinal x2   POLYPECTOMY  12/05/2017   Procedure: POLYPECTOMY;  Surgeon: Corbin Ade, MD;  Location: [facility];  Service: Endoscopy;;  colon    TONSILLECTOMY      Social History   Socioeconomic History   Marital status: Married    Spouse name: Dois Davenport   Number of children: 3   Years of education: Not on file   Highest education level: Not on file  Occupational History   Not on file  Tobacco Use   Smoking status: Former    Types: Cigars    Start date: 04/12/1969    Quit date: 04/25/2021    Years since quitting: 1.3   Smokeless tobacco: Former    Quit date: 05/20/1979   Tobacco comments:    5 cigars per week  Vaping Use   Vaping Use: Never used  Substance and Sexual Activity   Alcohol use: Not Currently    Comment: shot of whiskey or wine four  times per week but NONE in one year (09/2021)   Drug use: Yes    Comment: has a cookie/brownie with marijuana in it nightly that helps him sleep   Sexual activity: Yes  Other Topics Concern   Not on file  Social History Narrative   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   Right handed.    Social Determinants of Health   Financial Resource Strain: Not on file  Food Insecurity: Not on file  Transportation Needs: Not on file  Physical Activity: Not on file  Stress: Not on file  Social Connections: Not on file  Intimate Partner Violence: Not on file    Family History  Problem Relation Age of Onset   Hypertension Mother    Heart failure Mother    Prostate cancer Father    Hypertension Child    Hypertension Child    Hypertension Child    High Cholesterol Child    High Cholesterol Child    High Cholesterol Child    Cancer Paternal Aunt        unknown type   Cancer Paternal Aunt         unknown type   Colon cancer Neg Hx        Objective: Vitals:   08/25/22 1113  BP: (!) 148/70  Pulse: 68     Physical Exam Vitals reviewed.  Constitutional:      Appearance: Normal appearance.  Neurological:     Mental Status: He is alert.     Lab Results:  Results for orders placed or performed in visit on 08/25/22 (from the past 24 hour(s))  Urinalysis, Routine w reflex microscopic     Status: Abnormal   Collection Time: 08/25/22 11:13 AM  Result Value Ref Range   Specific Gravity, UA 1.015 1.005 - 1.030   pH, UA 7.0 5.0 - 7.5   Color, UA Yellow Yellow   Appearance Ur Clear Clear   Leukocytes,UA Negative Negative   Protein,UA 1+ (A) Negative/Trace   Glucose, UA 3+ (A) Negative   Ketones, UA Negative Negative   RBC, UA Negative Negative   Bilirubin, UA Negative Negative   Urobilinogen, Ur 0.2 0.2 - 1.0 mg/dL   Nitrite, UA Negative Negative   Microscopic Examination See below:    Narrative   Performed at:  [address] - [facility] [address] [place], [place]  [id number] Lab Director: Chinita Pester MT, Phone:  [phone]  Microscopic Examination     Status: Abnormal   Collection Time: 08/25/22 11:13 AM   Urine  Result Value Ref Range   WBC, UA 0-5 0 - 5 /hpf   RBC, Urine 0-2 0 - 2 /hpf   Epithelial Cells (non renal) 0-10 0 - 10 /hpf   Casts Present (A) None seen /lpf   Cast Type Hyaline casts N/A   Bacteria, UA None seen None seen/Few   Narrative   Performed at:  [address] - [facility] [address], [place], [place]  [id number] Lab Director: Chinita Pester MT, Phone:  [phone]    Lab Results  Component Value Date   PSA1 0.2 08/11/2022   PSA1 0.2 08/23/2021   PSA1 0.2 05/26/2021       BMET No results for input(s): "NA", "K", "CL", "CO2", "GLUCOSE", "BUN", "CREATININE", "CALCIUM" in the last 72 hours. PSA PSA  Date Value Ref Range Status  07/16/2019 <0.1 < OR = 4.0 ng/mL Final  Comment:    The total PSA value from this  assay system is  standardized against the WHO standard. The test  result will be approximately 20% lower when compared  to the equimolar-standardized total PSA (Beckman  Coulter). Comparison of serial PSA results should be  interpreted with this fact in mind. . This test was performed using the Siemens  chemiluminescent method. Values obtained from  different assay methods cannot be used interchangeably. PSA levels, regardless of value, should not be interpreted as absolute evidence of the presence or absence of disease.    Testosterone  Date Value Ref Range Status  08/11/2022 151 (L) 264 - 916 ng/dL Final    Comment:    Adult male reference interval is based on a population of healthy nonobese males (BMI <30) between 64 and 44 years old. Travison, et.al. JCEM [phone]. PMID: 08657846.   08/23/2021 104 (L) 264 - 916 ng/dL Final    Comment:    Adult male reference interval is based on a population of healthy nonobese males (BMI <30) between 12 and 85 years old. Travison, et.al. JCEM [phone]. PMID: 02725366.   02/05/2021 85 (L) 264 - 916 ng/dL Final    Comment:    Adult male reference interval is based on a population of healthy nonobese males (BMI <30) between 44 and 69 years old. Travison, et.al. JCEM [phone]. PMID: 56433295.     Lab Results  Component Value Date   PSA1 0.2 08/11/2022    08/25/22: : PSA 0.2 and T 151  UA is clear with 3+ glucose.   Studies/Results: .      Assessment & Plan: Prostate cancer.  His PSA is stable with a slight increase in the T.  He will return in 6 months with a PSA and testosterone.     BPH with BOO and Nocturia.  He had retention but that has resolved and he continues to void well off of the tamsulosin.   ED.  He failed oral therapy and is not interested in other treatment at this time.  He is having some tumescence with the rising T.   .     No orders of the defined types were placed in this  encounter.    Orders Placed This Encounter  Procedures   Microscopic Examination   Urinalysis, Routine w reflex microscopic   PSA    Standing Status:   Future    Standing Expiration Date:   08/25/2023   Testosterone    Standing Status:   Future    Standing Expiration Date:   08/25/2023      Return in about 6 months (around 02/25/2023) for with labs. .   CC: Benita Stabile, MD      Bjorn Pippin 08/26/2022 Patient ID: Odessa Fleming., male   DOB: [birth year], 77 y.o.   MRN: [id number]

## 2022-09-23 ENCOUNTER — Other Ambulatory Visit: Payer: Medicare Other

## 2022-11-09 ENCOUNTER — Ambulatory Visit (INDEPENDENT_AMBULATORY_CARE_PROVIDER_SITE_OTHER): Payer: Medicare Other | Admitting: Gastroenterology

## 2022-11-09 ENCOUNTER — Encounter: Payer: Self-pay | Admitting: Gastroenterology

## 2022-11-09 VITALS — BP 149/77 | HR 67 | Temp 98.6°F | Ht 72.0 in | Wt 224.0 lb

## 2022-11-09 DIAGNOSIS — K8681 Exocrine pancreatic insufficiency: Secondary | ICD-10-CM

## 2022-11-09 NOTE — Progress Notes (Signed)
GI Office Note    Referring Provider: Benita Stabile, MD Primary Care Physician:  Benita Stabile, MD  Primary Gastroenterologist: Roetta Sessions, MD   Chief Complaint   Chief Complaint  Patient presents with   Follow-up    Follow up on pancreatic insufficiency. Never took the meds for it    History of Present Illness   Glen Bennette. is a 77 y.o. male presenting today for follow up. Last seen 12/2021.   He has a history of small bowel obstruction in 2022 while on vacation in [place], transition point in the right lower quadrant.  At that time was noted to have slightly elevated AST of 49 and lipase of 258. 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   Completed CT enterography to further evaluate causes of small bowel obstruction.  No significant findings seen.  Obstruction felt to be related to adhesions.  Repeat lipase was mildly elevated at 98.  In June 2023, C. difficile and GI profile negative negative.  Fecal pancreatic elastase 145.  TSH and free T4 normal, celiac serologies negative, lipase normal 43.   In 11/2021, CT abdomen with and without contrast to evaluate pancreas, showed no pancreatic abnormalities. He had hepatic steatosis and descending colon diverticulosis.    Was on Zenpep when I last saw him, two weeks of samples. Was feeling better with dietary changes. Possibly stopping propranolol helped stools as well. BMs returned to normal prior to starting Zenpep.    Today, he is doing well. His stools are solid. Usually one stool per day. No abdominal pain. No melena, brbpr. No weight loss. He is worried about his pancreas because his sister-in-law was diagnosed with pancreatic cancer.  Reports that she had had some similar issues like he has had prior to her diagnosis.    Since we last saw him he had a neurostimulator placed in his back in February 2024.  Not a candidate for MRI testing.    Medications    Current Outpatient Medications  Medication Sig Dispense Refill   amLODipine (NORVASC) 10 MG tablet Take 10 mg by mouth daily.     aspirin EC 81 MG tablet Take 81 mg by mouth daily.     atorvastatin (LIPITOR) 20 MG tablet Take 20 mg by mouth daily.     BERBERINE CHLORIDE PO Take 600 mg by mouth 2 (two) times daily.     Biotin 16109 MCG TABS Take by mouth.     calcium carbonate (OSCAL) 1500 (600 Ca) MG TABS tablet Take by mouth 2 (two) times daily with a meal.     CHERRY PO Take 100 mg by mouth 2 (two) times daily.     Cholecalciferol 25 MCG (1000 UT) capsule Vitamin D3 25 mcg (1,000 unit) capsule  TAKE 1 CAPSULE (1,000 UNITS TOTAL) BY MOUTH ONE TIME EACH DAY.     ezetimibe (ZETIA) 10 MG tablet Take 10 mg by mouth daily.     fenofibrate micronized (LOFIBRA) 134 MG capsule Take by mouth daily.     Finerenone 10 MG TABS Take by mouth.     fluorouracil (EFUDEX) 5 % cream Apply 1 application topically as needed.      Gabapentin Enacarbil (HORIZANT) 600 MG TBCR Horizant ER 600 mg tablet,extended release   600 mg every day by oral route.     glipiZIDE (GLUCOTROL) 10 MG tablet Take 10 mg by mouth daily before breakfast.  JARDIANCE 25 MG TABS tablet Take 25 mg by mouth daily.  1   LEVEMIR FLEXTOUCH 100 UNIT/ML Pen Inject 40 Units into the skin 2 (two) times daily.     NON FORMULARY Uses CPAP nightly     NOVOLOG FLEXPEN 100 UNIT/ML FlexPen SMARTSIG:8 Unit(s) SUB-Q 3 Times Daily     pregabalin (LYRICA) 50 MG capsule Take 50 mg by mouth in the morning and at bedtime.     rOPINIRole (REQUIP) 1 MG tablet Take 1 mg by mouth 2 (two) times daily. Take 1 mg at 4:00pm  and take 2 mg at night     valsartan (DIOVAN) 40 MG tablet Take 40 mg by mouth daily.     hydrALAZINE (APRESOLINE) 50 MG tablet Take 50 mg by mouth 2 (two) times daily.     No current facility-administered medications for this visit.    Allergies   Allergies as of 11/09/2022 - Review Complete 11/09/2022  Allergen Reaction Noted    Mirapex  [pramipexole dihydrochloride] Other (See Comments) 02/12/2019   Other Other (See Comments) 02/12/2019   Pramipexole Other (See Comments) 02/26/2019   Ciprofloxacin Nausea Only 05/19/2014   Flagyl [metronidazole] Other (See Comments) 05/19/2014   Ibuprofen  05/31/2019   Propranolol Diarrhea 10/09/2021   Tramadol Nausea And Vomiting 10/24/2017    Review of Systems   General: Negative for anorexia, weight loss, fever, chills, fatigue, weakness. ENT: Negative for hoarseness, difficulty swallowing , nasal congestion. CV: Negative for chest pain, angina, palpitations, dyspnea on exertion, peripheral edema.  Respiratory: Negative for dyspnea at rest, dyspnea on exertion, cough, sputum, wheezing.  GI: See history of present illness. GU:  Negative for dysuria, hematuria, urinary incontinence, urinary frequency, nocturnal urination.  Endo: Negative for unusual weight change.     Physical Exam   BP (!) 149/77   Pulse 67   Temp 98.6 F (37 C)   Ht 6' (1.829 m)   Wt 224 lb (101.6 kg)   BMI 30.38 kg/m    General: Well-nourished, well-developed in no acute distress.  Eyes: No icterus. Mouth: Oropharyngeal mucosa moist and pink   Abdomen: Bowel sounds are normal, nontender, nondistended, no hepatosplenomegaly or masses,  no abdominal bruits or hernia , no rebound or guarding.  Rectal: Not performed Extremities: No lower extremity edema. No clubbing or deformities. Neuro: Alert and oriented x 4   Skin: Warm and dry, no jaundice.   Psych: Alert and cooperative, normal mood and affect.  Labs   June 2024: Glucose 169, BUN 49, creatinine 1.8, albumin 4.5, white blood cell count 7600, hemoglobin 15.7, platelets 258,000, vitamin D 26.8, Imaging Studies   No results found.  Assessment   *Exocrine pancreatic insufficiency  Clinically feels well.  Diarrhea resolved prior to starting pancreatic enzymes.  No unintentional weight loss.  Stools are normal.  Workup as outlined above  unremarkable.  Possibly with normal exocrine pancreatic function now.  Interested in repeating stool test.  Provided reassurance to patient regarding his concern for pancreatic cancer with previous unremarkable work up. There is not an indication for pancreatic cancer screening without FH of malignancy or abnormal genetic variants.   The patient was found to have elevated blood pressure when vital signs were checked in the office. The blood pressure was rechecked by the nursing staff and it was found be persistently elevated >140/90 mmHg. I personally advised to the patient to follow up closely with his PCP for hypertension control.   PLAN   Stool pancreatic elastase. Monitor for blood  in the stool, abdominal pain, diarrhea, weight loss.   Leanna Battles. Melvyn Neth, MHS, PA-C [facility]

## 2022-11-09 NOTE — Patient Instructions (Addendum)
Complete stool test at your convenience. We will be in touch with results within 5-7 business days. If you have not heard from [place], please reach out.  Monitor for any blood in the stool, abdominal pain, diarrhea, weight loss.

## 2022-12-16 ENCOUNTER — Other Ambulatory Visit: Payer: Self-pay | Admitting: Family Medicine

## 2022-12-16 DIAGNOSIS — M25511 Pain in right shoulder: Secondary | ICD-10-CM

## 2022-12-27 ENCOUNTER — Ambulatory Visit
Admission: RE | Admit: 2022-12-27 | Discharge: 2022-12-27 | Disposition: A | Discharge status: Home / Self Care | Payer: Medicare Other | Source: Ambulatory Visit | Attending: Family Medicine | Admitting: Family Medicine

## 2022-12-27 DIAGNOSIS — M25511 Pain in right shoulder: Secondary | ICD-10-CM

## 2022-12-27 MED ORDER — IOPAMIDOL (ISOVUE-M 200) INJECTION 41%
12.0000 mL | Freq: Once | INTRAMUSCULAR | Status: AC
  Administered 2022-12-27: 12 mL via INTRA_ARTICULAR

## 2023-01-27 ENCOUNTER — Ambulatory Visit: Payer: Medicare Other | Admitting: Neurology

## 2023-02-03 NOTE — Progress Notes (Addendum)
COVID Vaccine Completed:  Date of COVID positive in last 90 days:  No  PCP - Nita Sells, MD Cardiologist - Dina Rich, MD Nephrologist - Celso Amy, MD  Chest x-ray - N/A EKG - 02-06-23 Epic Stress Test - Yes, several years ago ECHO - 08-09-21 Epic Cardiac Cath - N/A Pacemaker/ICD device last checked: Spinal Cord Stimulator: Yes, patient will bring controller Long Term Monitor - 07-28-21 Epic   Bowel Prep - N/A  Sleep Study - Yes, +sleep apnea CPAP - Yes  Freestyle Libre Fasting Blood Sugar - 140 range Checks Blood Sugar - 4  times a day  Last dose of GLP1 agonist-  N/A GLP1 instructions:  N/A   Jardiance Last dose of SGLT-2 inhibitors-  N/A SGLT-2 instructions: Nephrologist instructed patient to hold x1 week   Blood Thinner Instructions:  Time Aspirin Instructions:  ASA 81.  Patient will stop one week prior to surgery  Last Dose:  Activity level:  Can go up a flight of stairs and perform activities of daily living without stopping and without symptoms of chest pain or shortness of breath.  Able to exercise without symptoms  Anesthesia review: CAD, Mild aortic stenosis, HTN, DM, CKD  BUN 32 and creatinine 1.63, A1c 7.6 on PAT labs   Patient denies shortness of breath, fever, cough and chest pain at PAT appointment  Patient verbalized understanding of instructions that were given to them at the PAT appointment. Patient was also instructed that they will need to review over the PAT instructions again at home before surgery.

## 2023-02-03 NOTE — Patient Instructions (Addendum)
SURGICAL WAITING ROOM VISITATION Patients having surgery or a procedure may have no more than 2 support people in the waiting area - these visitors may rotate.    Children under the age of 4 must have an adult with them who is not the patient.  If the patient needs to stay at the hospital during part of their recovery, the visitor guidelines for inpatient rooms apply. Pre-op nurse will coordinate an appropriate time for 1 support person to accompany patient in pre-op.  This support person may not rotate.    Please refer to the [facility] website for the visitor guidelines for Inpatients (after your surgery is over and you are in a regular room).       Your procedure is scheduled on: 02-17-23   Report to [facility] Main Entrance    Report to admitting at 5:15 AM   Call this number if you have problems the morning of surgery [phone]   Do not eat food :After Midnight.   After Midnight you may have the following liquids until 4:15 AM DAY OF SURGERY  Water Non-Citrus Juices (without pulp, NO RED-Apple, White grape, White cranberry) Black Coffee (NO MILK/CREAM OR CREAMERS, sugar ok)  Clear Tea (NO MILK/CREAM OR CREAMERS, sugar ok) regular and decaf                             Plain Jell-O (NO RED)                                           Fruit ices (not with fruit pulp, NO RED)                                     Popsicles (NO RED)                                                               Sports drinks like Gatorade (NO RED)                   The day of surgery:  Drink ONE (1) Pre-Surgery G2 by 4:15 AM the morning of surgery. Drink in one sitting. Do not sip.  This drink was given to you during your hospital  pre-op appointment visit. Nothing else to drink after completing the Pre-Surgery G2.          If you have questions, please contact your surgeon's office.   FOLLOW  ANY ADDITIONAL PRE OP INSTRUCTIONS YOU RECEIVED FROM YOUR SURGEON'S OFFICE!!!      Oral Hygiene is also important to reduce your risk of infection.                                    Remember - BRUSH YOUR TEETH THE MORNING OF SURGERY WITH YOUR REGULAR TOOTHPASTE   Do NOT smoke after Midnight   Take these medicines the morning of surgery with A SIP OF WATER:   Amlodipine  Atorvastatin  Ezetimibe  Fenofibrate  Kerendia  Gabapentin  Hydralazine  Pregabalin  Stop all vitamins and herbal supplements 7 days before surgery  How to Manage Your Diabetes Before and After Surgery  Why is it important to control my blood sugar before and after surgery? Improving blood sugar levels before and after surgery helps healing and can limit problems. A way of improving blood sugar control is eating a healthy diet by:  Eating less sugar and carbohydrates  Increasing activity/exercise  Talking with your doctor about reaching your blood sugar goals High blood sugars (greater than 180 mg/dL) can raise your risk of infections and slow your recovery, so you will need to focus on controlling your diabetes during the weeks before surgery. Make sure that the doctor who takes care of your diabetes knows about your planned surgery including the date and location.  How do I manage my blood sugar before surgery? Check your blood sugar at least 4 times a day, starting 2 days before surgery, to make sure that the level is not too high or low. Check your blood sugar the morning of your surgery when you wake up and every 2 hours until you get to the Short Stay unit. If your blood sugar is less than 70 mg/dL, you will need to treat for low blood sugar: Do not take insulin. Treat a low blood sugar (less than 70 mg/dL) with  cup of clear juice (cranberry or apple), 4 glucose tablets, OR glucose gel. Recheck blood sugar in 15 minutes after treatment (to make sure it is greater than 70 mg/dL). If your blood sugar is not greater than 70 mg/dL on recheck, call [phone] for further  instructions. Report your blood sugar to the short stay nurse when you get to Short Stay.  If you are admitted to the hospital after surgery: Your blood sugar will be checked by the staff and you will probably be given insulin after surgery (instead of oral diabetes medicines) to make sure you have good blood sugar levels. The goal for blood sugar control after surgery is 80-180 mg/dL.   WHAT DO I DO ABOUT MY DIABETES MEDICATION?  Do not take oral diabetes medicines (pills) the morning of surgery.        Hold Jardiance one week prior to surgery   THE NIGHT BEFORE SURGERY:   take  19 units of Lantus insulin.     THE MORNING OF SURGERY,:  If your CBG is greater than 220 mg/dL, you may take  of your sliding scale (Novolog)  (correction) dose of insulin.  DO NOT TAKE THE FOLLOWING 7 DAYS PRIOR TO SURGERY: Ozempic, Wegovy, Rybelsus (Semaglutide), Byetta (exenatide), Bydureon (exenatide ER), Victoza, Saxenda (liraglutide), or Trulicity (dulaglutide) Mounjaro (Tirzepatide) Adlyxin (Lixisenatide), Polyethylene Glycol Loxenatide.  Reviewed and Endorsed by [facility] Patient Education Committee, August 2015  Bring CPAP mask and tubing day of surgery.                              You may not have any metal on your body including  jewelry, and body piercing             Do not wear lotions, powders, cologne, or deodorant              Men may shave face and neck.   Do not bring valuables to the hospital. [facility] IS NOT RESPONSIBLE   FOR VALUABLES.   Contacts, dentures or bridgework may not be worn  into surgery.   Bring small overnight bag day of surgery.   DO NOT BRING YOUR HOME MEDICATIONS TO THE HOSPITAL. PHARMACY WILL DISPENSE MEDICATIONS LISTED ON YOUR MEDICATION LIST TO YOU DURING YOUR ADMISSION IN THE HOSPITAL!     Special Instructions: Bring a copy of your healthcare power of attorney and living will documents the day of surgery if you haven't scanned them before.               Please read over the following fact sheets you were given: IF YOU HAVE QUESTIONS ABOUT YOUR PRE-OP INSTRUCTIONS PLEASE CALL [phone] Glen Guerrero  If you received a COVID test during your pre-op visit  it is requested that you wear a mask when out in public, stay away from anyone that may not be feeling well and notify your surgeon if you develop symptoms. If you test positive for Covid or have been in contact with anyone that has tested positive in the last 10 days please notify you surgeon.   [place]- Preparing for Total Shoulder Arthroplasty    Before surgery, you can play an important role. Because skin is not sterile, your skin needs to be as free of germs as possible. You can reduce the number of germs on your skin by using the following products. Benzoyl Peroxide Gel Reduces the number of germs present on the skin Applied twice a day to shoulder area starting two days before surgery    ==================================================================  Please follow these instructions carefully:  BENZOYL PEROXIDE 5% GEL  Please do not use if you have an allergy to benzoyl peroxide.   If your skin becomes reddened/irritated stop using the benzoyl peroxide.  Starting two days before surgery, apply as follows: Apply benzoyl peroxide in the morning and at night. Apply after taking a shower. If you are not taking a shower clean entire shoulder front, back, and side along with the armpit with a clean wet washcloth.  Place a quarter-sized dollop on your shoulder and rub in thoroughly, making sure to cover the front, back, and side of your shoulder, along with the armpit.   2 days before ____ AM   ____ PM              1 day before ____ AM   ____ PM                         Do this twice a day for two days.  (Last application is the night before surgery, AFTER using the CHG soap as described below).  Do NOT apply benzoyl peroxide gel on the day of surgery.    Pre-operative 5 CHG Bath  Instructions   You can play a key role in reducing the risk of infection after surgery. Your skin needs to be as free of germs as possible. You can reduce the number of germs on your skin by washing with CHG (chlorhexidine gluconate) soap before surgery. CHG is an antiseptic soap that kills germs and continues to kill germs even after washing.   DO NOT use if you have an allergy to chlorhexidine/CHG or antibacterial soaps. If your skin becomes reddened or irritated, stop using the CHG and notify one of our RNs at [phone].   Please shower with the CHG soap starting 4 days before surgery using the following schedule:     Please keep in mind the following:  DO NOT shave, including legs and underarms, starting the day of your first  shower.   You may shave your face at any point before/day of surgery.  Place clean sheets on your bed the day you start using CHG soap. Use a clean washcloth (not used since being washed) for each shower. DO NOT sleep with pets once you start using the CHG.   CHG Shower Instructions:  If you choose to wash your hair and private area, wash first with your normal shampoo/soap.  After you use shampoo/soap, rinse your hair and body thoroughly to remove shampoo/soap residue.  Turn the water OFF and apply about 3 tablespoons (45 ml) of CHG soap to a CLEAN washcloth.  Apply CHG soap ONLY FROM YOUR NECK DOWN TO YOUR TOES (washing for 3-5 minutes)  DO NOT use CHG soap on face, private areas, open wounds, or sores.  Pay special attention to the area where your surgery is being performed.  If you are having back surgery, having someone wash your back for you may be helpful. Wait 2 minutes after CHG soap is applied, then you may rinse off the CHG soap.  Pat dry with a clean towel  Put on clean clothes/pajamas   If you choose to wear lotion, please use ONLY the CHG-compatible lotions on the back of this paper.     Additional instructions for the day of surgery: DO NOT  APPLY any lotions, deodorants, cologne, or perfumes.   Put on clean/comfortable clothes.  Brush your teeth.  Ask your nurse before applying any prescription medications to the skin.      CHG Compatible Lotions   Aveeno Moisturizing lotion  Cetaphil Moisturizing Cream  Cetaphil Moisturizing Lotion  Clairol Herbal Essence Moisturizing Lotion, Dry Skin  Clairol Herbal Essence Moisturizing Lotion, Extra Dry Skin  Clairol Herbal Essence Moisturizing Lotion, Normal Skin  Curel Age Defying Therapeutic Moisturizing Lotion with Alpha Hydroxy  Curel Extreme Care Body Lotion  Curel Soothing Hands Moisturizing Hand Lotion  Curel Therapeutic Moisturizing Cream, Fragrance-Free  Curel Therapeutic Moisturizing Lotion, Fragrance-Free  Curel Therapeutic Moisturizing Lotion, Original Formula  Eucerin Daily Replenishing Lotion  Eucerin Dry Skin Therapy Plus Alpha Hydroxy Crme  Eucerin Dry Skin Therapy Plus Alpha Hydroxy Lotion  Eucerin Original Crme  Eucerin Original Lotion  Eucerin Plus Crme Eucerin Plus Lotion  Eucerin TriLipid Replenishing Lotion  Keri Anti-Bacterial Hand Lotion  Keri Deep Conditioning Original Lotion Dry Skin Formula Softly Scented  Keri Deep Conditioning Original Lotion, Fragrance Free Sensitive Skin Formula  Keri Lotion Fast Absorbing Fragrance Free Sensitive Skin Formula  Keri Lotion Fast Absorbing Softly Scented Dry Skin Formula  Keri Original Lotion  Keri Skin Renewal Lotion Keri Silky Smooth Lotion  Keri Silky Smooth Sensitive Skin Lotion  Nivea Body Creamy Conditioning Oil  Nivea Body Extra Enriched Lotion  Nivea Body Original Lotion  Nivea Body Sheer Moisturizing Lotion Nivea Crme  Nivea Skin Firming Lotion  NutraDerm 30 Skin Lotion  NutraDerm Skin Lotion  NutraDerm Therapeutic Skin Cream  NutraDerm Therapeutic Skin Lotion  ProShield Protective Hand Cream  Provon moisturizing lotion   PATIENT SIGNATURE_________________________________  NURSE  SIGNATURE__________________________________  ________________________________________________________________________    Glen Guerrero  An incentive spirometer is a tool that can help keep your lungs clear and active. This tool measures how well you are filling your lungs with each breath. Taking long deep breaths may help reverse or decrease the chance of developing breathing (pulmonary) problems (especially infection) following: A long period of time when you are unable to move or be active. BEFORE THE PROCEDURE  If the spirometer includes an  indicator to show your best effort, your nurse or respiratory therapist will set it to a desired goal. If possible, sit up straight or lean slightly forward. Try not to slouch. Hold the incentive spirometer in an upright position. INSTRUCTIONS FOR USE  Sit on the edge of your bed if possible, or sit up as far as you can in bed or on a chair. Hold the incentive spirometer in an upright position. Breathe out normally. Place the mouthpiece in your mouth and seal your lips tightly around it. Breathe in slowly and as deeply as possible, raising the piston or the ball toward the top of the column. Hold your breath for 3-5 seconds or for as long as possible. Allow the piston or ball to fall to the bottom of the column. Remove the mouthpiece from your mouth and breathe out normally. Rest for a few seconds and repeat Steps 1 through 7 at least 10 times every 1-2 hours when you are awake. Take your time and take a few normal breaths between deep breaths. The spirometer may include an indicator to show your best effort. Use the indicator as a goal to work toward during each repetition. After each set of 10 deep breaths, practice coughing to be sure your lungs are clear. If you have an incision (the cut made at the time of surgery), support your incision when coughing by placing a pillow or rolled up towels firmly against it. Once you are able to get out of  bed, walk around indoors and cough well. You may stop using the incentive spirometer when instructed by your caregiver.  RISKS AND COMPLICATIONS Take your time so you do not get dizzy or light-headed. If you are in pain, you may need to take or ask for pain medication before doing incentive spirometry. It is harder to take a deep breath if you are having pain. AFTER USE Rest and breathe slowly and easily. It can be helpful to keep track of a log of your progress. Your caregiver can provide you with a simple table to help with this. If you are using the spirometer at home, follow these instructions: SEEK MEDICAL CARE IF:  You are having difficultly using the spirometer. You have trouble using the spirometer as often as instructed. Your pain medication is not giving enough relief while using the spirometer. You develop fever of 100.5 F (38.1 C) or higher. SEEK IMMEDIATE MEDICAL CARE IF:  You cough up bloody sputum that had not been present before. You develop fever of 102 F (38.9 C) or greater. You develop worsening pain at or near the incision site. MAKE SURE YOU:  Understand these instructions. Will watch your condition. Will get help right away if you are not doing well or get worse. Document Released: 08/22/2006 Document Revised: 07/04/2011 Document Reviewed: 10/23/2006 [facility] Patient Information 2014 [place], [place].   ________________________________________________________________________

## 2023-02-06 ENCOUNTER — Encounter (HOSPITAL_COMMUNITY)
Admission: RE | Admit: 2023-02-06 | Discharge: 2023-02-06 | Disposition: A | Discharge status: Home / Self Care | Payer: Medicare Other | Source: Ambulatory Visit | Attending: Orthopedic Surgery | Admitting: Orthopedic Surgery

## 2023-02-06 ENCOUNTER — Other Ambulatory Visit: Payer: Self-pay

## 2023-02-06 ENCOUNTER — Encounter (HOSPITAL_COMMUNITY): Payer: Self-pay

## 2023-02-06 VITALS — BP 154/78 | HR 65 | Temp 98.4°F | Resp 16 | Ht 72.0 in | Wt 219.0 lb

## 2023-02-06 DIAGNOSIS — Z01818 Encounter for other preprocedural examination: Secondary | ICD-10-CM | POA: Insufficient documentation

## 2023-02-06 DIAGNOSIS — Z794 Long term (current) use of insulin: Secondary | ICD-10-CM | POA: Insufficient documentation

## 2023-02-06 DIAGNOSIS — E119 Type 2 diabetes mellitus without complications: Secondary | ICD-10-CM | POA: Diagnosis not present

## 2023-02-06 HISTORY — DX: Gastro-esophageal reflux disease without esophagitis: K21.9

## 2023-02-06 LAB — CBC
HCT: 46.7 % (ref 39.0–52.0)
Hemoglobin: 15.4 g/dL (ref 13.0–17.0)
MCH: 30.8 pg (ref 26.0–34.0)
MCHC: 33 g/dL (ref 30.0–36.0)
MCV: 93.4 fL (ref 80.0–100.0)
Platelets: 266 10*3/uL (ref 150–400)
RBC: 5 MIL/uL (ref 4.22–5.81)
RDW: 13.2 % (ref 11.5–15.5)
WBC: 5.8 10*3/uL (ref 4.0–10.5)
nRBC: 0 % (ref 0.0–0.2)

## 2023-02-06 LAB — SURGICAL PCR SCREEN
MRSA, PCR: NEGATIVE
Staphylococcus aureus: NEGATIVE

## 2023-02-06 LAB — BASIC METABOLIC PANEL
Anion gap: 8 (ref 5–15)
BUN: 32 mg/dL — ABNORMAL HIGH (ref 8–23)
CO2: 25 mmol/L (ref 22–32)
Calcium: 9.2 mg/dL (ref 8.9–10.3)
Chloride: 105 mmol/L (ref 98–111)
Creatinine, Ser: 1.63 mg/dL — ABNORMAL HIGH (ref 0.61–1.24)
GFR, Estimated: 44 mL/min — ABNORMAL LOW (ref >60)
Glucose, Bld: 146 mg/dL — ABNORMAL HIGH (ref 70–99)
Potassium: 4.5 mmol/L (ref 3.5–5.1)
Sodium: 138 mmol/L (ref 135–145)

## 2023-02-06 LAB — HEMOGLOBIN A1C
Hgb A1c MFr Bld: 7.6 % — ABNORMAL HIGH (ref 4.8–5.6)
Mean Plasma Glucose: 171.42 mg/dL

## 2023-02-06 LAB — GLUCOSE, CAPILLARY: Glucose-Capillary: 157 mg/dL — ABNORMAL HIGH (ref 70–99)

## 2023-02-08 ENCOUNTER — Encounter (HOSPITAL_COMMUNITY): Admission: RE | Admit: 2023-02-08 | Payer: Medicare Other | Source: Ambulatory Visit

## 2023-02-08 NOTE — Anesthesia Preprocedure Evaluation (Addendum)
Anesthesia Evaluation  Patient identified by MRN, date of birth, ID band Patient awake    Reviewed: Allergy & Precautions, NPO status , Patient's Chart, lab work & pertinent test results  History of Anesthesia Complications Negative for: history of anesthetic complications  Airway Mallampati: II  TM Distance: >3 FB Neck ROM: Full    Dental  (+) Missing, Dental Advisory Given   Pulmonary sleep apnea and Continuous Positive Airway Pressure Ventilation , former smoker   breath sounds clear to auscultation       Cardiovascular hypertension, Pt. on medications (-) angina  Rhythm:Regular Rate:Normal  '23 ECHO: EF 60-65%, normal LVF with mild LVH, normal RVF, very mild AS with peak grad 5 mmHg, mean grad 2 mmHg   Neuro/Psych negative neurological ROS     GI/Hepatic Neg liver ROS,GERD  Controlled,,  Endo/Other  diabetes (glu 217), Insulin Dependent, Oral Hypoglycemic Agents    Renal/GU Renal InsufficiencyRenal disease   Prostate cancer    Musculoskeletal  (+) Arthritis ,    Abdominal   Peds  Hematology   Anesthesia Other Findings   Reproductive/Obstetrics                             Anesthesia Physical Anesthesia Plan  ASA: 3  Anesthesia Plan: General   Post-op Pain Management: Regional block* and Tylenol PO (pre-op)*   Induction: Intravenous  PONV Risk Score and Plan: 2 and Ondansetron and Dexamethasone  Airway Management Planned: Oral ETT  Additional Equipment: None  Intra-op Plan:   Post-operative Plan: Extubation in OR  Informed Consent: I have reviewed the patients History and Physical, chart, labs and discussed the procedure including the risks, benefits and alternatives for the proposed anesthesia with the patient or authorized representative who has indicated his/her understanding and acceptance.     Dental advisory given  Plan Discussed with: CRNA and  Surgeon  Anesthesia Plan Comments: (See PAT note 02/06/23 Plan routine monitors, GETA with interscalene block for post op analgesia)       Anesthesia Quick Evaluation

## 2023-02-08 NOTE — Progress Notes (Signed)
Anesthesia Chart Review   Case: [id number] Date/Time: 02/17/23 0700   Procedure: TOTAL SHOULDER ARTHROPLASTY (Right: Shoulder)   Anesthesia type: Choice   Pre-op diagnosis: Right shoulder osteoarthritis   Location: [facility] ROOM 08 / [facility]   Surgeons: Yolonda Kida, MD       DISCUSSION:air entry former smoker with h/o HTN, OSA with CPAP, DM II, CKD Stage III, intention tremor, prostate cancer, right shoulder OA scheduled for above procedure 02/17/23 with Dr. Duwayne Heck.   Creatinine appears stable.   A1C 7.6, forwarded to Dr. Rennis Chris.   Pt with spinal cord stimulator in place.   Pt referred to cardiology in 2023 due to fatigue.  Seen by Dr. Wyline Mood.  Per Dr. Verna Czech notes, benign echo, no further workup needed.   Echo 07/2021 LVEF 60-65%, no WMAs, normal diastolic , normal RV.  VS: BP (!) 154/78   Pulse 65   Temp 36.9 C (Oral)   Resp 16   Ht 6' (1.829 m)   Wt 99.3 kg   SpO2 98%   BMI 29.70 kg/m   PROVIDERS: Benita Stabile, MD is PCP   Cardiologist - Dina Rich, MD Nephrologist - Celso Amy, MD LABS: Labs reviewed: Acceptable for surgery. (all labs ordered are listed, but only abnormal results are displayed)  Labs Reviewed  HEMOGLOBIN A1C - Abnormal; Notable for the following components:      Result Value   Hgb A1c MFr Bld 7.6 (*)    All other components within normal limits  BASIC METABOLIC PANEL - Abnormal; Notable for the following components:   Glucose, Bld 146 (*)    BUN 32 (*)    Creatinine, Ser 1.63 (*)    GFR, Estimated 44 (*)    All other components within normal limits  GLUCOSE, CAPILLARY - Abnormal; Notable for the following components:   Glucose-Capillary 157 (*)    All other components within normal limits  SURGICAL PCR SCREEN  CBC     IMAGES:   EKG:   CV: Echo 08/09/2021  1. Left ventricular ejection fraction, by estimation, is 60 to 65%. The  left ventricle has normal function. The left ventricle has no regional  wall  motion abnormalities. There is mild left ventricular hypertrophy.  Left ventricular diastolic parameters  were normal.   2. Right ventricular systolic function is normal. The right ventricular  size is normal.   3. Left atrial size was mildly dilated.   4. The mitral valve is normal in structure. No evidence of mitral valve  regurgitation. No evidence of mitral stenosis.   5. Very mild stenosis mean gradient 5 mmhg normal DVI 0.77 AVA 1.9 cm2.  The aortic valve is tricuspid. There is moderate calcification of the  aortic valve. There is moderate thickening of the aortic valve. Aortic  valve regurgitation is not visualized.  Mild aortic valve stenosis.   6. The inferior vena cava is normal in size with greater than 50%  respiratory variability, suggesting right atrial pressure of 3 mmHg.  Past Medical History:  Diagnosis Date   Arthritis    Carpal tunnel syndrome    Chronic kidney disease, stage 3a (HCC)    Diverticulitis    DM type 2 (diabetes mellitus, type 2) (HCC)    GERD (gastroesophageal reflux disease)    Gout    History of colon polyps    2015 polyp x 1 dysplastic and adenomatous pieces.   Hypercholesterolemia    Hypertension    Insomnia    Prostate cancer (  HCC)    Prostate enlargement    Restless leg syndrome    Seasonal allergies    Skin cancer     Past Surgical History:  Procedure Laterality Date   APPENDECTOMY     CARPAL TUNNEL RELEASE Left 08/12/2021   COLONOSCOPY  04/25/2013   Dr.Spainhour- polypectomy x 1 pieces dysplastic and adenomatous; Recc repeat in 2020 (5 years).   COLONOSCOPY N/A 12/05/2017   Procedure: COLONOSCOPY;  Surgeon: Corbin Ade, MD;  [facility];  Service: Endoscopy;  Laterality: N/A;  11:15am   HERNIA REPAIR     inguinal x2   POLYPECTOMY  12/05/2017   Procedure: POLYPECTOMY;  Surgeon: Corbin Ade, MD;  [facility];  Service: Endoscopy;;  colon    TONSILLECTOMY     TRIGGER FINGER RELEASE       MEDICATIONS:  cephALEXin (KEFLEX) 250 MG capsule   HYDROcodone-acetaminophen (NORCO/VICODIN) 5-325 MG tablet   amLODipine (NORVASC) 10 MG tablet   aspirin EC 81 MG tablet   atorvastatin (LIPITOR) 20 MG tablet   BERBERINE CHLORIDE PO   CHERRY PO   Cholecalciferol 25 MCG (1000 UT) capsule   ezetimibe (ZETIA) 10 MG tablet   fenofibrate micronized (LOFIBRA) 134 MG capsule   Finerenone (KERENDIA) 10 MG TABS   fluorouracil (EFUDEX) 5 % cream   Gabapentin Enacarbil (HORIZANT) 600 MG TBCR   glipiZIDE (GLUCOTROL) 10 MG tablet   hydrALAZINE (APRESOLINE) 50 MG tablet   JARDIANCE 25 MG TABS tablet   LANTUS SOLOSTAR 100 UNIT/ML Solostar Pen   magnesium gluconate (MAGONATE) 500 MG tablet   NON FORMULARY   NOVOLOG FLEXPEN 100 UNIT/ML FlexPen   Omega-3 Fatty Acids (FISH OIL) 1000 MG CAPS   pregabalin (LYRICA) 50 MG capsule   Probiotic Product (PHILLIPS COLON HEALTH) CAPS   rOPINIRole (REQUIP) 1 MG tablet   Turmeric 500 MG TABS   valsartan (DIOVAN) 40 MG tablet   No current facility-administered medications for this encounter.     Jodell Cipro Ward, PA-C [facility]-Surgical Testing [phone]

## 2023-02-17 ENCOUNTER — Observation Stay (HOSPITAL_COMMUNITY)
Admission: RE | Admit: 2023-02-17 | Discharge: 2023-02-18 | Disposition: A | Discharge status: Home / Self Care | Payer: Medicare Other | Source: Ambulatory Visit | Attending: Orthopedic Surgery | Admitting: Orthopedic Surgery

## 2023-02-17 ENCOUNTER — Other Ambulatory Visit: Payer: Self-pay

## 2023-02-17 ENCOUNTER — Ambulatory Visit (HOSPITAL_COMMUNITY): Payer: Medicare Other

## 2023-02-17 ENCOUNTER — Encounter (HOSPITAL_COMMUNITY): Payer: Self-pay | Admitting: Orthopedic Surgery

## 2023-02-17 ENCOUNTER — Ambulatory Visit (HOSPITAL_COMMUNITY): Payer: Medicare Other | Admitting: Anesthesiology

## 2023-02-17 ENCOUNTER — Encounter (HOSPITAL_COMMUNITY)
Admission: RE | Disposition: A | Discharge status: Home / Self Care | Payer: Self-pay | Source: Ambulatory Visit | Attending: Orthopedic Surgery

## 2023-02-17 ENCOUNTER — Ambulatory Visit (HOSPITAL_COMMUNITY): Payer: Medicare Other | Admitting: Physician Assistant

## 2023-02-17 DIAGNOSIS — I129 Hypertensive chronic kidney disease with stage 1 through stage 4 chronic kidney disease, or unspecified chronic kidney disease: Secondary | ICD-10-CM | POA: Insufficient documentation

## 2023-02-17 DIAGNOSIS — M19011 Primary osteoarthritis, right shoulder: Secondary | ICD-10-CM | POA: Diagnosis present

## 2023-02-17 DIAGNOSIS — Z87891 Personal history of nicotine dependence: Secondary | ICD-10-CM | POA: Diagnosis not present

## 2023-02-17 DIAGNOSIS — E1122 Type 2 diabetes mellitus with diabetic chronic kidney disease: Secondary | ICD-10-CM | POA: Insufficient documentation

## 2023-02-17 DIAGNOSIS — Z79899 Other long term (current) drug therapy: Secondary | ICD-10-CM | POA: Insufficient documentation

## 2023-02-17 DIAGNOSIS — Z85828 Personal history of other malignant neoplasm of skin: Secondary | ICD-10-CM | POA: Diagnosis not present

## 2023-02-17 DIAGNOSIS — N1831 Chronic kidney disease, stage 3a: Secondary | ICD-10-CM | POA: Diagnosis not present

## 2023-02-17 DIAGNOSIS — Z8546 Personal history of malignant neoplasm of prostate: Secondary | ICD-10-CM | POA: Insufficient documentation

## 2023-02-17 DIAGNOSIS — Z794 Long term (current) use of insulin: Secondary | ICD-10-CM

## 2023-02-17 DIAGNOSIS — E119 Type 2 diabetes mellitus without complications: Secondary | ICD-10-CM

## 2023-02-17 DIAGNOSIS — Z7982 Long term (current) use of aspirin: Secondary | ICD-10-CM | POA: Diagnosis not present

## 2023-02-17 DIAGNOSIS — G4733 Obstructive sleep apnea (adult) (pediatric): Secondary | ICD-10-CM | POA: Diagnosis not present

## 2023-02-17 DIAGNOSIS — Z01818 Encounter for other preprocedural examination: Principal | ICD-10-CM

## 2023-02-17 DIAGNOSIS — Z96611 Presence of right artificial shoulder joint: Secondary | ICD-10-CM

## 2023-02-17 HISTORY — PX: TOTAL SHOULDER ARTHROPLASTY: SHX126

## 2023-02-17 LAB — GLUCOSE, CAPILLARY
Glucose-Capillary: 217 mg/dL — ABNORMAL HIGH (ref 70–99)
Glucose-Capillary: 185 mg/dL — ABNORMAL HIGH (ref 70–99)
Glucose-Capillary: 206 mg/dL — ABNORMAL HIGH (ref 70–99)
Glucose-Capillary: 302 mg/dL — ABNORMAL HIGH (ref 70–99)
Glucose-Capillary: 377 mg/dL — ABNORMAL HIGH (ref 70–99)
Glucose-Capillary: 359 mg/dL — ABNORMAL HIGH (ref 70–99)

## 2023-02-17 SURGERY — ARTHROPLASTY, SHOULDER, TOTAL
Anesthesia: General | Site: Shoulder | Laterality: Right

## 2023-02-17 MED ORDER — ONDANSETRON HCL 4 MG/2ML IJ SOLN: 4.0000 mg | Freq: Four times a day (QID) | INTRAMUSCULAR | Status: DC | PRN

## 2023-02-17 MED ORDER — CEFAZOLIN SODIUM-DEXTROSE 2-4 GM/100ML-% IV SOLN
2.0000 g | Freq: Four times a day (QID) | INTRAVENOUS | Status: AC
  Administered 2023-02-17 – 2023-02-18 (×3): 2 g via INTRAVENOUS
  Filled 2023-02-17 (×3): qty 100

## 2023-02-17 MED ORDER — EZETIMIBE 10 MG PO TABS
10.0000 mg | ORAL_TABLET | Freq: Every day | ORAL | Status: DC
  Administered 2023-02-18: 10 mg via ORAL
  Filled 2023-02-17: qty 1

## 2023-02-17 MED ORDER — ASPIRIN 81 MG PO TBEC
81.0000 mg | DELAYED_RELEASE_TABLET | Freq: Every day | ORAL | Status: DC
  Administered 2023-02-18: 81 mg via ORAL
  Filled 2023-02-17: qty 1

## 2023-02-17 MED ORDER — SUGAMMADEX SODIUM 200 MG/2ML IV SOLN
INTRAVENOUS | Status: DC | PRN
  Administered 2023-02-17: 400 mg via INTRAVENOUS

## 2023-02-17 MED ORDER — VANCOMYCIN HCL 1000 MG IV SOLR
INTRAVENOUS | Status: AC
  Filled 2023-02-17: qty 20

## 2023-02-17 MED ORDER — DOCUSATE SODIUM 100 MG PO CAPS
100.0000 mg | ORAL_CAPSULE | Freq: Two times a day (BID) | ORAL | Status: DC
  Administered 2023-02-17 – 2023-02-18 (×2): 100 mg via ORAL
  Filled 2023-02-17 (×2): qty 1

## 2023-02-17 MED ORDER — ONDANSETRON HCL 4 MG/2ML IJ SOLN
INTRAMUSCULAR | Status: DC | PRN
  Administered 2023-02-17: 4 mg via INTRAVENOUS

## 2023-02-17 MED ORDER — ACETAMINOPHEN 500 MG PO TABS
1000.0000 mg | ORAL_TABLET | Freq: Once | ORAL | Status: AC
  Administered 2023-02-17: 1000 mg via ORAL
  Filled 2023-02-17: qty 2

## 2023-02-17 MED ORDER — ONDANSETRON HCL 4 MG PO TABS: 4.0000 mg | ORAL_TABLET | Freq: Four times a day (QID) | ORAL | Status: DC | PRN

## 2023-02-17 MED ORDER — FENTANYL CITRATE (PF) 100 MCG/2ML IJ SOLN
INTRAMUSCULAR | Status: AC
  Filled 2023-02-17: qty 2

## 2023-02-17 MED ORDER — VANCOMYCIN HCL 1000 MG IV SOLR
INTRAVENOUS | Status: DC | PRN
  Administered 2023-02-17: 1000 mg via TOPICAL

## 2023-02-17 MED ORDER — PHENYLEPHRINE 80 MCG/ML (10ML) SYRINGE FOR IV PUSH (FOR BLOOD PRESSURE SUPPORT)
PREFILLED_SYRINGE | INTRAVENOUS | Status: AC
  Filled 2023-02-17: qty 10

## 2023-02-17 MED ORDER — INSULIN ASPART 100 UNIT/ML IJ SOLN
0.0000 [IU] | Freq: Every day | INTRAMUSCULAR | Status: DC
  Administered 2023-02-17: 5 [IU] via SUBCUTANEOUS

## 2023-02-17 MED ORDER — METOCLOPRAMIDE HCL 5 MG/ML IJ SOLN: 5.0000 mg | Freq: Three times a day (TID) | INTRAMUSCULAR | Status: DC | PRN

## 2023-02-17 MED ORDER — AMLODIPINE BESYLATE 10 MG PO TABS
10.0000 mg | ORAL_TABLET | Freq: Every day | ORAL | Status: DC
  Administered 2023-02-18: 10 mg via ORAL
  Filled 2023-02-17: qty 1

## 2023-02-17 MED ORDER — IRBESARTAN 75 MG PO TABS
37.5000 mg | ORAL_TABLET | Freq: Every day | ORAL | Status: DC
  Administered 2023-02-18: 37.5 mg via ORAL
  Filled 2023-02-17: qty 1

## 2023-02-17 MED ORDER — AMISULPRIDE (ANTIEMETIC) 5 MG/2ML IV SOLN: 10.0000 mg | Freq: Once | INTRAVENOUS | Status: AC | PRN

## 2023-02-17 MED ORDER — ROPINIROLE HCL 1 MG PO TABS: 1.0000 mg | ORAL_TABLET | Freq: Every day | ORAL | Status: DC

## 2023-02-17 MED ORDER — HYDROCODONE-ACETAMINOPHEN 7.5-325 MG PO TABS
1.0000 | ORAL_TABLET | ORAL | Status: DC | PRN
  Administered 2023-02-18: 1 via ORAL
  Filled 2023-02-17: qty 2

## 2023-02-17 MED ORDER — METOCLOPRAMIDE HCL 5 MG PO TABS: 5.0000 mg | ORAL_TABLET | Freq: Three times a day (TID) | ORAL | Status: DC | PRN

## 2023-02-17 MED ORDER — ACETAMINOPHEN 500 MG PO TABS
500.0000 mg | ORAL_TABLET | Freq: Four times a day (QID) | ORAL | Status: AC
  Administered 2023-02-17 – 2023-02-18 (×4): 500 mg via ORAL
  Filled 2023-02-17 (×3): qty 1

## 2023-02-17 MED ORDER — MAGNESIUM GLUCONATE 500 MG PO TABS
500.0000 mg | ORAL_TABLET | Freq: Every day | ORAL | Status: DC
  Administered 2023-02-18: 500 mg via ORAL
  Filled 2023-02-17: qty 1

## 2023-02-17 MED ORDER — ROPINIROLE HCL 1 MG PO TABS
1.0000 mg | ORAL_TABLET | Freq: Every day | ORAL | Status: DC
  Administered 2023-02-17: 1 mg via ORAL
  Filled 2023-02-17: qty 1

## 2023-02-17 MED ORDER — INSULIN ASPART 100 UNIT/ML IJ SOLN
0.0000 [IU] | INTRAMUSCULAR | Status: AC | PRN
  Administered 2023-02-17 (×2): 2 [IU] via SUBCUTANEOUS
  Filled 2023-02-17: qty 1

## 2023-02-17 MED ORDER — INSULIN ASPART 100 UNIT/ML IJ SOLN
INTRAMUSCULAR | Status: AC
  Filled 2023-02-17: qty 1

## 2023-02-17 MED ORDER — HYDROCODONE-ACETAMINOPHEN 5-325 MG PO TABS: 1.0000 | ORAL_TABLET | ORAL | Status: DC | PRN

## 2023-02-17 MED ORDER — ACETAMINOPHEN 325 MG PO TABS
325.0000 mg | ORAL_TABLET | Freq: Four times a day (QID) | ORAL | Status: DC | PRN
  Filled 2023-02-17: qty 2
  Filled 2023-02-17: qty 1

## 2023-02-17 MED ORDER — LACTATED RINGERS IV SOLN: INTRAVENOUS | Status: DC

## 2023-02-17 MED ORDER — ROCURONIUM BROMIDE 10 MG/ML (PF) SYRINGE
PREFILLED_SYRINGE | INTRAVENOUS | Status: AC
Start: 2023-02-17 — End: ?
  Filled 2023-02-17: qty 10

## 2023-02-17 MED ORDER — HYDROMORPHONE HCL 1 MG/ML IJ SOLN
0.2500 mg | INTRAMUSCULAR | Status: DC | PRN
  Administered 2023-02-17: 0.5 mg via INTRAVENOUS

## 2023-02-17 MED ORDER — PHENYLEPHRINE HCL-NACL 20-0.9 MG/250ML-% IV SOLN
INTRAVENOUS | Status: DC | PRN
  Administered 2023-02-17: 30 ug/min via INTRAVENOUS

## 2023-02-17 MED ORDER — DEXAMETHASONE SODIUM PHOSPHATE 10 MG/ML IJ SOLN
INTRAMUSCULAR | Status: AC
  Filled 2023-02-17: qty 1

## 2023-02-17 MED ORDER — STERILE WATER FOR IRRIGATION IR SOLN
Status: DC | PRN
  Administered 2023-02-17: 1000 mL

## 2023-02-17 MED ORDER — PHENOL 1.4 % MT LIQD: 1.0000 | OROMUCOSAL | Status: DC | PRN

## 2023-02-17 MED ORDER — 0.9 % SODIUM CHLORIDE (POUR BTL) OPTIME
TOPICAL | Status: DC | PRN
  Administered 2023-02-17: 1000 mL

## 2023-02-17 MED ORDER — FENTANYL CITRATE (PF) 100 MCG/2ML IJ SOLN
INTRAMUSCULAR | Status: DC | PRN
  Administered 2023-02-17: 50 ug via INTRAVENOUS
  Administered 2023-02-17 (×4): 25 ug via INTRAVENOUS
  Administered 2023-02-17: 50 ug via INTRAVENOUS

## 2023-02-17 MED ORDER — ROCURONIUM BROMIDE 100 MG/10ML IV SOLN
INTRAVENOUS | Status: DC | PRN
  Administered 2023-02-17: 20 mg via INTRAVENOUS
  Administered 2023-02-17: 60 mg via INTRAVENOUS

## 2023-02-17 MED ORDER — OXYCODONE HCL 5 MG PO TABS: 5.0000 mg | ORAL_TABLET | ORAL | 0 refills | Status: DC | PRN

## 2023-02-17 MED ORDER — ROPINIROLE HCL 1 MG PO TABS
2.0000 mg | ORAL_TABLET | Freq: Every day | ORAL | Status: DC
  Administered 2023-02-17: 2 mg via ORAL
  Filled 2023-02-17: qty 2

## 2023-02-17 MED ORDER — HYDROMORPHONE HCL 1 MG/ML IJ SOLN
INTRAMUSCULAR | Status: AC
  Administered 2023-02-17: 0.5 mg via INTRAVENOUS
  Filled 2023-02-17: qty 2

## 2023-02-17 MED ORDER — MENTHOL 3 MG MT LOZG: 1.0000 | LOZENGE | OROMUCOSAL | Status: DC | PRN

## 2023-02-17 MED ORDER — ATORVASTATIN CALCIUM 20 MG PO TABS
20.0000 mg | ORAL_TABLET | Freq: Every day | ORAL | Status: DC
  Administered 2023-02-18: 20 mg via ORAL
  Filled 2023-02-17: qty 1

## 2023-02-17 MED ORDER — INSULIN ASPART 100 UNIT/ML IJ SOLN
0.0000 [IU] | Freq: Three times a day (TID) | INTRAMUSCULAR | Status: DC
  Administered 2023-02-17 – 2023-02-18 (×2): 11 [IU] via SUBCUTANEOUS

## 2023-02-17 MED ORDER — GLIPIZIDE 10 MG PO TABS
10.0000 mg | ORAL_TABLET | Freq: Every day | ORAL | Status: DC
  Administered 2023-02-18: 10 mg via ORAL
  Filled 2023-02-17: qty 1

## 2023-02-17 MED ORDER — HYDRALAZINE HCL 50 MG PO TABS
50.0000 mg | ORAL_TABLET | Freq: Two times a day (BID) | ORAL | Status: DC
  Administered 2023-02-17 – 2023-02-18 (×2): 50 mg via ORAL
  Filled 2023-02-17 (×2): qty 1

## 2023-02-17 MED ORDER — EPHEDRINE SULFATE-NACL 50-0.9 MG/10ML-% IV SOSY
PREFILLED_SYRINGE | INTRAVENOUS | Status: DC | PRN
Start: 2023-02-17 — End: 2023-02-17
  Administered 2023-02-17: 5 mg via INTRAVENOUS
  Administered 2023-02-17: 2.5 mg via INTRAVENOUS
  Administered 2023-02-17: 5 mg via INTRAVENOUS

## 2023-02-17 MED ORDER — MORPHINE SULFATE (PF) 2 MG/ML IV SOLN
0.5000 mg | INTRAVENOUS | Status: DC | PRN
  Administered 2023-02-17 – 2023-02-18 (×2): 1 mg via INTRAVENOUS
  Filled 2023-02-17 (×2): qty 1

## 2023-02-17 MED ORDER — PREGABALIN 50 MG PO CAPS
50.0000 mg | ORAL_CAPSULE | Freq: Two times a day (BID) | ORAL | Status: DC
  Administered 2023-02-17 – 2023-02-18 (×2): 50 mg via ORAL
  Filled 2023-02-17 (×2): qty 1

## 2023-02-17 MED ORDER — OMEGA-3-ACID ETHYL ESTERS 1 G PO CAPS
1.0000 g | ORAL_CAPSULE | Freq: Every day | ORAL | Status: DC
  Administered 2023-02-18: 1 g via ORAL
  Filled 2023-02-17: qty 1

## 2023-02-17 MED ORDER — EPHEDRINE 5 MG/ML INJ
INTRAVENOUS | Status: AC
  Filled 2023-02-17: qty 5

## 2023-02-17 MED ORDER — ORAL CARE MOUTH RINSE: 15.0000 mL | Freq: Once | OROMUCOSAL | Status: AC

## 2023-02-17 MED ORDER — EMPAGLIFLOZIN 25 MG PO TABS
25.0000 mg | ORAL_TABLET | Freq: Every day | ORAL | Status: DC
  Administered 2023-02-18: 25 mg via ORAL
  Filled 2023-02-17: qty 1

## 2023-02-17 MED ORDER — OXYCODONE HCL 5 MG PO TABS: 5.0000 mg | ORAL_TABLET | Freq: Once | ORAL | Status: DC | PRN

## 2023-02-17 MED ORDER — DEXAMETHASONE SODIUM PHOSPHATE 10 MG/ML IJ SOLN
INTRAMUSCULAR | Status: DC | PRN
  Administered 2023-02-17: 8 mg via INTRAVENOUS

## 2023-02-17 MED ORDER — CHLORHEXIDINE GLUCONATE 0.12 % MT SOLN
15.0000 mL | Freq: Once | OROMUCOSAL | Status: AC
  Administered 2023-02-17: 15 mL via OROMUCOSAL

## 2023-02-17 MED ORDER — PROPOFOL 10 MG/ML IV BOLUS
INTRAVENOUS | Status: AC
  Filled 2023-02-17: qty 20

## 2023-02-17 MED ORDER — BUPIVACAINE LIPOSOME 1.3 % IJ SUSP
INTRAMUSCULAR | Status: DC | PRN
  Administered 2023-02-17: 10 mL via PERINEURAL

## 2023-02-17 MED ORDER — BUPIVACAINE-EPINEPHRINE (PF) 0.5% -1:200000 IJ SOLN
INTRAMUSCULAR | Status: DC | PRN
  Administered 2023-02-17: 15 mL via PERINEURAL

## 2023-02-17 MED ORDER — TRANEXAMIC ACID-NACL 1000-0.7 MG/100ML-% IV SOLN
1000.0000 mg | INTRAVENOUS | Status: AC
  Administered 2023-02-17: 1000 mg via INTRAVENOUS
  Filled 2023-02-17: qty 100

## 2023-02-17 MED ORDER — AMISULPRIDE (ANTIEMETIC) 5 MG/2ML IV SOLN
INTRAVENOUS | Status: AC
  Administered 2023-02-17: 10 mg via INTRAVENOUS
  Filled 2023-02-17: qty 4

## 2023-02-17 MED ORDER — OXYCODONE HCL 5 MG/5ML PO SOLN: 5.0000 mg | Freq: Once | ORAL | Status: DC | PRN

## 2023-02-17 MED ORDER — ONDANSETRON HCL 4 MG/2ML IJ SOLN
INTRAMUSCULAR | Status: AC
  Filled 2023-02-17: qty 2

## 2023-02-17 MED ORDER — PROPOFOL 10 MG/ML IV BOLUS
INTRAVENOUS | Status: DC | PRN
  Administered 2023-02-17: 20 mg via INTRAVENOUS
  Administered 2023-02-17: 30 mg via INTRAVENOUS
  Administered 2023-02-17: 150 mg via INTRAVENOUS

## 2023-02-17 MED ORDER — ONDANSETRON HCL 4 MG PO TABS: 4.0000 mg | ORAL_TABLET | Freq: Three times a day (TID) | ORAL | 0 refills | Status: DC | PRN

## 2023-02-17 MED ORDER — CEFAZOLIN SODIUM-DEXTROSE 2-4 GM/100ML-% IV SOLN
2.0000 g | INTRAVENOUS | Status: AC
  Administered 2023-02-17: 2 g via INTRAVENOUS
  Filled 2023-02-17: qty 100

## 2023-02-17 MED ORDER — MIDAZOLAM HCL 2 MG/2ML IJ SOLN
0.5000 mg | Freq: Once | INTRAMUSCULAR | Status: DC | PRN
Start: 2023-02-17 — End: 2023-02-17

## 2023-02-17 SURGICAL SUPPLY — 58 items
AID PSTN UNV HD RSTRNT DISP (MISCELLANEOUS)
BAG COUNTER SPONGE SURGICOUNT (BAG) IMPLANT
BAG SPEC THK2 15X12 ZIP CLS (MISCELLANEOUS) ×1
BAG SPNG CNTER NS LX DISP (BAG)
BAG ZIPLOCK 12X15 (MISCELLANEOUS) ×1 IMPLANT
BLADE SAW SGTL 18X1.27X75 (BLADE) ×1 IMPLANT
BODY TRUNION ECLIPSE 43 SL (Shoulder) IMPLANT
CEMENT BONE DEPUY (Cement) ×1 IMPLANT
CLSR STERI-STRIP ANTIMIC 1/2X4 (GAUZE/BANDAGES/DRESSINGS) IMPLANT
COOLER ICEMAN CLASSIC (MISCELLANEOUS) ×1 IMPLANT
COVER BACK TABLE 60X90IN (DRAPES) ×1 IMPLANT
COVER MAYO STAND STRL (DRAPES) ×1 IMPLANT
COVER SURGICAL LIGHT HANDLE (MISCELLANEOUS) ×1 IMPLANT
DRAPE IMP U-DRAPE 54X76 (DRAPES) ×1 IMPLANT
DRAPE ORTHO SPLIT 77X108 STRL (DRAPES) ×2
DRAPE SHEET LG 3/4 BI-LAMINATE (DRAPES) ×1 IMPLANT
DRAPE SURG 17X11 SM STRL (DRAPES) ×1 IMPLANT
DRAPE SURG ORHT 6 SPLT 77X108 (DRAPES) ×2 IMPLANT
DRAPE U-SHAPE 47X51 STRL (DRAPES) ×1 IMPLANT
DRSG AQUACEL AG ADV 3.5X 6 (GAUZE/BANDAGES/DRESSINGS) IMPLANT
DRSG AQUACEL AG ADV 3.5X10 (GAUZE/BANDAGES/DRESSINGS) IMPLANT
DURAPREP 26ML APPLICATOR (WOUND CARE) ×1 IMPLANT
FACESHIELD WRAPAROUND (MASK) ×1
FACESHIELD WRAPAROUND OR TEAM (MASK) ×1 IMPLANT
GLENOID WITH CLEAT MEDIUM (Shoulder) IMPLANT
GLOVE BIO SURGEON STRL SZ7.5 (GLOVE) ×4 IMPLANT
GLOVE BIOGEL PI IND STRL 8 (GLOVE) ×2 IMPLANT
GOWN STRL REUS W/ TWL XL LVL3 (GOWN DISPOSABLE) ×2 IMPLANT
GOWN STRL REUS W/TWL XL LVL3 (GOWN DISPOSABLE) ×2
HEAD HUMERAL ECLIPSE 43/16 (Shoulder) IMPLANT
IMPL ECLIPSE SPEEDCAP (Shoulder) IMPLANT
IMPLANT ECLIPSE SPEEDCAP (Shoulder) ×1 IMPLANT
KIT TURNOVER KIT A (KITS) IMPLANT
MANIFOLD NEPTUNE II (INSTRUMENTS) ×1 IMPLANT
PACK SHOULDER (CUSTOM PROCEDURE TRAY) ×1 IMPLANT
PAD COLD SHLDR WRAP-ON (PAD) ×1 IMPLANT
PIN SET MODULAR GLENOID SYSTEM (PIN) IMPLANT
PROTECTOR NERVE ULNAR (MISCELLANEOUS) ×1 IMPLANT
RESTRAINT HEAD UNIVERSAL NS (MISCELLANEOUS) IMPLANT
SCREW MED ECLIPSE 35 (Screw) IMPLANT
SIZER ECLIPSE CAGE SCREW (ORTHOPEDIC DISPOSABLE SUPPLIES) IMPLANT
SLING ARM FOAM STRAP LRG (SOFTGOODS) IMPLANT
SLING ARM FOAM STRAP MED (SOFTGOODS) IMPLANT
SLING ULTRA II L (ORTHOPEDIC SUPPLIES) IMPLANT
SMARTMIX MINI TOWER (MISCELLANEOUS) ×1
SPONGE T-LAP 4X18 ~~LOC~~+RFID (SPONGE) ×1 IMPLANT
STRIP CLOSURE SKIN 1/2X4 (GAUZE/BANDAGES/DRESSINGS) ×1 IMPLANT
SUCTION TUBE FRAZIER 12FR DISP (SUCTIONS) ×1 IMPLANT
SUT MNCRL AB 3-0 PS2 18 (SUTURE) ×1 IMPLANT
SUT VIC AB 0 CT1 36 (SUTURE) ×1 IMPLANT
SUT VIC AB 2-0 CT1 27 (SUTURE) ×1
SUT VIC AB 2-0 CT1 TAPERPNT 27 (SUTURE) ×1 IMPLANT
SUTURE TAPE 1.3 40 TPR END (SUTURE) ×1 IMPLANT
SUTURETAPE 1.3 40 TPR END (SUTURE) ×1
TOWEL OR 17X26 10 PK STRL BLUE (TOWEL DISPOSABLE) ×1 IMPLANT
TOWEL OR NON WOVEN STRL DISP B (DISPOSABLE) ×1 IMPLANT
TOWER SMARTMIX MINI (MISCELLANEOUS) IMPLANT
TUBE SUCTION HIGH CAP CLEAR NV (SUCTIONS) ×1 IMPLANT

## 2023-02-17 NOTE — OR Nursing (Signed)
2831- contacted pharmacy to let them know we did not have any Novolog in the pyxis. To send new vial. Patients CBG 217 @ 0559. Will need 2u insulin once received from pharmacy.  0710- 2 u Novolog given subcutaneous- L arm .

## 2023-02-17 NOTE — Discharge Instructions (Signed)
Orthopedic surgery discharge instructions:  -Maintain postoperative bandages until follow up.  In 2 days begin showering at that time.  Please do not submerge underwater.  -Maintain your arm in sling at all times.  You should only remove for showering and getting dressed.  No lifting with the operative arm.  -For mild to moderate pain use Tylenol and Advil in alternating fashion around-the-clock.  For breakthrough pain use oxycodone as necessary.  -Please apply ice to the right shoulder for 20-30 minutes out of each hour that you are awake.  Do this around-the-clock for the first 3 days from surgery.  -Follow-up in 2 weeks for routine postoperative check.

## 2023-02-17 NOTE — Anesthesia Procedure Notes (Addendum)
Procedure Name: Intubation Date/Time: 02/17/2023 7:36 AM  Performed by: Ahmed Prima, CRNAPre-anesthesia Checklist: Patient identified, Emergency Drugs available, Suction available and Patient being monitored Patient Re-evaluated:Patient Re-evaluated prior to induction Oxygen Delivery Method: Circle system utilized Preoxygenation: Pre-oxygenation with 100% oxygen Induction Type: IV induction Ventilation: Oral airway inserted - appropriate to patient size, Two handed mask ventilation required and Mask ventilation without difficulty Laryngoscope Size: Miller and 3 Grade View: Grade II Tube type: Oral Tube size: 7.5 mm Number of attempts: 1 Airway Equipment and Method: Stylet and Oral airway Placement Confirmation: ETT inserted through vocal cords under direct vision, positive ETCO2 and breath sounds checked- equal and bilateral Secured at: 23 (at the lip) cm Tube secured with: Tape Dental Injury: Teeth and Oropharynx as per pre-operative assessment  Comments: 2 handed mask with OA due to pt.'s facial hair/beard making a seal difficult (not due to anatomical structure).

## 2023-02-17 NOTE — H&P (Signed)
ORTHOPAEDIC H and P  REQUESTING PHYSICIAN: Yolonda Kida, MD  PCP:  Benita Stabile, MD  Chief Complaint: right shoulder arthritis  HPI: Glen Guerrero. is a 77 y.o. male who complains of right shoulder pain and weakness. Here today for right total shoulder.  Past Medical History:  Diagnosis Date   Arthritis    Carpal tunnel syndrome    Chronic kidney disease, stage 3a (HCC)    Diverticulitis    DM type 2 (diabetes mellitus, type 2) (HCC)    GERD (gastroesophageal reflux disease)    Gout    History of colon polyps    2015 polyp x 1 dysplastic and adenomatous pieces.   Hypercholesterolemia    Hypertension    Insomnia    Prostate cancer [facility])    Prostate enlargement    Restless leg syndrome    Seasonal allergies    Skin cancer    Past Surgical History:  Procedure Laterality Date   APPENDECTOMY     CARPAL TUNNEL RELEASE Left 08/12/2021   COLONOSCOPY  04/25/2013   Dr.Spainhour- polypectomy x 1 pieces dysplastic and adenomatous; Recc repeat in 2020 (5 years).   COLONOSCOPY N/A 12/05/2017   Procedure: COLONOSCOPY;  Surgeon: Corbin Ade, MD;  [facility];  Service: Endoscopy;  Laterality: N/A;  11:15am   HERNIA REPAIR     inguinal x2   POLYPECTOMY  12/05/2017   Procedure: POLYPECTOMY;  Surgeon: Corbin Ade, MD;  Location: [facility];  Service: Endoscopy;;  colon    TONSILLECTOMY     TRIGGER FINGER RELEASE     Social History   Socioeconomic History   Marital status: Married    Spouse name: Dois Davenport   Number of children: 3   Years of education: Not on file   Highest education level: Not on file  Occupational History   Not on file  Tobacco Use   Smoking status: Former    Types: Cigars    Start date: 04/12/1969    Quit date: 04/25/2021    Years since quitting: 1.8   Smokeless tobacco: Former    Quit date: 05/20/1979   Tobacco comments:    5 cigars per week  Vaping Use   Vaping status: Never Used  Substance and Sexual Activity    Alcohol use: Not Currently   Drug use: Yes    Comment: has a cookie/brownie with marijuana in it nightly that helps him sleep   Sexual activity: Yes  Other Topics Concern   Not on file  Social History Narrative   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   Right handed.    Social Determinants of Health   Financial Resource Strain: Low Risk  (07/10/2019)   Received from [facility], [facility]   Overall Financial Resource Strain (CARDIA)    Difficulty of Paying Living Expenses: Not hard at all  Food Insecurity: Unknown (07/10/2019)   Received from [facility], [facility]   Hunger Vital Sign    Worried About Running Out of Food in the Last Year: Patient declined    Ran Out of Food in the Last Year: Patient declined  Transportation Needs: Unknown (07/10/2019)   Received from [facility], [facility]   [facility] - Transportation    Lack of Transportation (Medical): Patient declined    Lack of Transportation (Non-Medical): Patient declined  Physical Activity: Unknown (07/10/2019)  Received from [facility], [facility]   Exercise Vital Sign    Days of Exercise per Week: Patient declined    Minutes of Exercise per Session: Patient declined  Stress: No Stress Concern Present (07/10/2019)   Received from [facility], [facility] Occupational [facility] - Occupational Stress Questionnaire    Feeling of Stress : Only a little  Social Connections: Unknown (07/10/2019)   Received from [facility], [facility]   Social Connection and Isolation Panel [NHANES]    Frequency of Communication with Friends and Family: Patient declined    Frequency of Social Gatherings with Friends and Family: Patient declined    Attends Religious Services: Patient declined    Database administrator or Organizations: Patient declined    Attends Engineer, structural: Patient declined     Marital Status: Patient declined   Family History  Problem Relation Age of Onset   Hypertension Mother    Heart failure Mother    Prostate cancer Father    Hypertension Child    Hypertension Child    Hypertension Child    High Cholesterol Child    High Cholesterol Child    High Cholesterol Child    Cancer Paternal Aunt        unknown type   Cancer Paternal Aunt        unknown type   Colon cancer Neg Hx    Allergies  Allergen Reactions   Mirapex  [Pramipexole Dihydrochloride] Other (See Comments)   Other Other (See Comments)   Pramipexole Other (See Comments)   Ciprofloxacin Nausea Only    Lightheaded   Flagyl [Metronidazole] Other (See Comments)    Can't remember symptoms   Ibuprofen    Propranolol Diarrhea   Tramadol Nausea And Vomiting    Can't take any medication similar to Tramadol   Prior to Admission medications   Medication Sig Start Date End Date Taking? Authorizing Provider  amLODipine (NORVASC) 10 MG tablet Take 10 mg by mouth daily.   Yes [provider]  aspirin EC 81 MG tablet Take 81 mg by mouth daily.   Yes [provider]  atorvastatin (LIPITOR) 20 MG tablet Take 20 mg by mouth daily.   Yes [provider]  BERBERINE CHLORIDE PO Take 600 mg by mouth 2 (two) times daily. 11/29/21  Yes [provider]  CHERRY PO Take 1,000 mg by mouth 2 (two) times daily.   Yes [provider]  Cholecalciferol 25 MCG (1000 UT) capsule Take 1,000 Units by mouth daily. 07/02/20  Yes [provider]  ezetimibe (ZETIA) 10 MG tablet Take 10 mg by mouth daily.   Yes [provider]  fenofibrate micronized (LOFIBRA) 134 MG capsule Take 134 mg by mouth daily. 01/29/19  Yes [provider]  Finerenone (KERENDIA) 10 MG TABS Take 10 mg by mouth every other day.   Yes [provider]  fluorouracil (EFUDEX) 5 % cream Apply 1 application  topically daily as needed (irritation).   Yes [provider]   Gabapentin Enacarbil (HORIZANT) 600 MG TBCR Take 600 mg by mouth in the morning and at bedtime. 01/26/21  Yes [provider]  glipiZIDE (GLUCOTROL) 10 MG tablet Take 10 mg by mouth daily before breakfast.    Yes [provider]  hydrALAZINE (APRESOLINE) 50 MG tablet Take 50 mg by mouth 2 (two) times daily. 05/14/21 02/17/23 Yes [provider]  JARDIANCE 25 MG  TABS tablet Take 25 mg by mouth daily. 08/19/17  Yes [provider]  LANTUS SOLOSTAR 100 UNIT/ML Solostar Pen Inject 38 Units into the skin 2 (two) times daily.   Yes [provider]  magnesium gluconate (MAGONATE) 500 MG tablet Take 500 mg by mouth daily.   Yes [provider]  NOVOLOG FLEXPEN 100 UNIT/ML FlexPen Inject 8 Units into the skin 3 (three) times daily with meals. 08/02/21  Yes [provider]  Omega-3 Fatty Acids (FISH OIL) 1000 MG CAPS Take 1,000 mg by mouth in the morning and at bedtime.   Yes [provider]  pregabalin (LYRICA) 50 MG capsule Take 50 mg by mouth in the morning and at bedtime. 08/02/21  Yes [provider]  Probiotic Product (PHILLIPS COLON HEALTH) CAPS Take 1 capsule by mouth daily.   Yes [provider]  rOPINIRole (REQUIP) 1 MG tablet Take 1-2 mg by mouth See admin instructions. Take 1 mg at by mouth in the afternoon and take 2 mg at night   Yes [provider]  Turmeric 500 MG TABS Take 500 mg by mouth daily.   Yes [provider]  valsartan (DIOVAN) 40 MG tablet Take 40 mg by mouth daily. 07/21/21  Yes [provider]  cephALEXin (KEFLEX) 250 MG capsule Take 1 capsule by mouth 4 (four) times daily.    [provider]  HYDROcodone-acetaminophen (NORCO/VICODIN) 5-325 MG tablet Take 1 tablet by mouth every 6 (six) hours as needed for moderate pain.    [provider]  NON FORMULARY Uses CPAP nightly    [provider]   No results found.  Positive ROS: All other  systems have been reviewed and were otherwise negative with the exception of those mentioned in the HPI and as above.  Physical Exam: General: Alert, no acute distress Cardiovascular: No pedal edema Respiratory: No cyanosis, no use of accessory musculature GI: No organomegaly, abdomen is soft and non-tender Skin: No lesions in the area of chief complaint Neurologic: Sensation intact distally Psychiatric: Patient is competent for consent with normal mood and affect Lymphatic: No axillary or cervical lymphadenopathy  MUSCULOSKELETAL: RUE- wwp, no open wounds, NVI  Assessment: End stage right shoulder arthritis  Plan: Plan to proceed with anatomic right total shoulder.  No new complaints.  The risks, benefits, and alternatives were discussed with the patient. There are risks associated with the surgery including, but not limited to, problems with anesthesia (death), infection, differences in leg length/angulation/rotation, fracture of bones, loosening or failure of implants, malunion, nonunion, hematoma (blood accumulation) which may require surgical drainage, blood clots, pulmonary embolism, nerve injury (foot drop), and blood vessel injury. The patient understands these risks and elects to proceed.   -plan to admit to observation post op    Yolonda Kida, MD Cell [phone]    02/17/2023 6:22 AM

## 2023-02-17 NOTE — Brief Op Note (Signed)
02/17/2023  9:16 AM  PATIENT:  Glen Guerrero  77 y.o. male  PRE-OPERATIVE DIAGNOSIS:  Right shoulder osteoarthritis  POST-OPERATIVE DIAGNOSIS:  Right shoulder osteoarthritis  PROCEDURE:  Procedure(s): TOTAL SHOULDER ARTHROPLASTY (Right)  SURGEON:  Surgeons and Role:    * Yolonda Kida, MD - Primary  PHYSICIAN ASSISTANT: Dion Saucier, PA-C   ANESTHESIA:   regional and general  EBL:  50 mL   BLOOD ADMINISTERED:none  DRAINS: none   LOCAL MEDICATIONS USED:  NONE  SPECIMEN:  No Specimen  DISPOSITION OF SPECIMEN:  N/A  COUNTS:  YES  TOURNIQUET:  * No tourniquets in log *  DICTATION: .Note written in EPIC  PLAN OF CARE: Discharge to home after PACU  PATIENT DISPOSITION:  PACU - hemodynamically stable.   Delay start of Pharmacological VTE agent (>24hrs) due to surgical blood loss or risk of bleeding: not applicable

## 2023-02-17 NOTE — Progress Notes (Signed)
   02/17/23 1540  TOC Brief Assessment  Insurance and Status Reviewed  Patient has primary care physician Yes  Home environment has been reviewed Resides with spouse  Prior level of function: Independent at baseline  Prior/Current Home Services No current home services  Social Determinants of Health Reivew SDOH reviewed no interventions necessary  Readmission risk has been reviewed Yes  Transition of care needs no transition of care needs at this time

## 2023-02-17 NOTE — Anesthesia Procedure Notes (Signed)
Anesthesia Regional Block: Interscalene brachial plexus block   Pre-Anesthetic Checklist: , timeout performed,  Correct Patient, Correct Site, Correct Laterality,  Correct Procedure, Correct Position, site marked,  Risks and benefits discussed,  Surgical consent,  Pre-op evaluation,  At surgeon's request and post-op pain management  Laterality: Right and Upper  Prep: chloraprep       Needles:  Injection technique: Single-shot  Needle Type: Echogenic Needle     Needle Length: 9cm  Needle Gauge: 21     Additional Needles:   Procedures:,,,, ultrasound used (permanent image in chart),,    Narrative:  Start time: 02/17/2023 7:00 AM End time: 02/17/2023 7:06 AM Injection made incrementally with aspirations every 5 mL.  Performed by: Personally  Anesthesiologist: Jairo Ben, MD  Additional Notes: Pt identified in Holding room.  Monitors applied. Working IV access confirmed. Timeout, Sterile prep R clavicle and neck.  #21ga ECHOgenic Arrow block needle to interscalene brachial plexus with US guidance.  15cc 0.5% Bupivacaine 1:200k epi, Exparel injected incrementally after negative test dose.  Patient asymptomatic, VSS, no heme aspirated, tolerated well.   Sandford Craze, MD

## 2023-02-17 NOTE — Anesthesia Postprocedure Evaluation (Signed)
Anesthesia Post Note  Patient: Glen Guerrero.  Procedure(s) Performed: TOTAL SHOULDER ARTHROPLASTY (Right: Shoulder)     Patient location during evaluation: PACU Anesthesia Type: General Level of consciousness: awake and alert, patient cooperative and oriented Pain management: pain level controlled Vital Signs Assessment: post-procedure vital signs reviewed and stable Respiratory status: spontaneous breathing, nonlabored ventilation and respiratory function stable Cardiovascular status: blood pressure returned to baseline and stable Postop Assessment: no apparent nausea or vomiting Anesthetic complications: no   No notable events documented.  Last Vitals:  Vitals:   02/17/23 1145 02/17/23 1200  BP: 132/67 136/67  Pulse: 60 61  Resp: (!) 24 16  Temp:  36.7 C  SpO2: 92% 92%    Last Pain:  Vitals:   02/17/23 1200  TempSrc:   PainSc: Asleep                 Zyrell Carmean,E. [place] Depaz

## 2023-02-17 NOTE — Op Note (Signed)
Date of Surgery: 02/17/2023  INDICATIONS: Glen Guerrero is a 77 y.o.-year-old male with a right shoulder osteoarthritis;  The patient did consent to the procedure after discussion of the risks and benefits.  PREOPERATIVE DIAGNOSIS:  Right shoulder osteoarthritis  POSTOPERATIVE DIAGNOSIS: Same.  PROCEDURE: right shoulder anatomic arthroplasty  SURGEON: Maryan Rued, M.D.  ASSIST: Dion Saucier, PA-C  Assistant attestation PA Mcclung present and scrubbed for the entire procedure  ANESTHESIA:  general, interscalene with Exparel  IV FLUIDS AND URINE: See anesthesia.  ESTIMATED BLOOD LOSS: 50 mL.  IMPLANTS: Arthrex anatomic shoulder arthroplasty Site medium glenoid 43 mm trunnion with a medium cage screw and a 43 x 16 humeral head  DRAINS: None  COMPLICATIONS: None.  DESCRIPTION OF PROCEDURE: After obtaining informed consent,The patient was brought to the operating room and placed supine on the operating table.  The patient had been signed prior to the procedure and this was documented. The patient had the anesthesia placed by the anesthesiologist.  A time-out was performed to confirm that this was the correct patient, site, side and location. The patient did receive antibiotics prior to the incision and was re-dosed during the procedure as needed at indicated intervals.   The patient had the operative extremity prepped and draped in the standard surgical fashion.   The head and neck were nicely stabilized.   A 10 blade was used to make a standard deltopectoral approach to the shoulder.  Dissection was carried out through the subcutaneous tissue to where the deltopectoral interval was visualized and developed.  The cephalic vein was mobilized and retracted medially for the duration of the case.  The subacromial space was cleared bluntly retractors were placed appropriately deep to the pectoralis major tendon and deep to the deltoid muscle fascia.  The upper one third of the pectoralis  muscle insertion on the humerus was sharply released.  There was found to be a small rent in the supraspinatus but otherwise the rotator cuff tendons were intact.  Next I performed a subscapularis takedown and a peel fashion.  Subscapularis, rotator interval, and the inferior capsule were all released.  There were inferior humeral osteophytes that were removed with rongeur and curved osteotome.  After, Releases, a humeral head osteotomy was performed in 30 of retroversion and at 135 head neck angle.  A protractor was placed within the glenoid vault to protect axillary nerve and posterior capsular structures.  Next we prepared the humerus.  We trialed for anatomic position of the trunnion with diameters of 45 which did over hanging inferiorly.  Thus we downsized to a 43 which fit appropriately.  We then prepared for the central cage screw which measured to be a medium in length.  Next we placed the humeral osteotomy protector onto the humerus and moved our attention to the glenoid.  Retractors were placed anteriorly, superiorly, and posteriorly and the labrum was excised.  Exposure of the glenoid was obtained.  The center drill bit was used followed by sequential reaming.  Next using the pegged glenoid guide the holes were drilled appropriately.  The medium size glenoid proved to be the appropriate size for this glenoid.  Next the holes were drilled and chiseled appropriately.  Glenoid  was washed with antibiotic irrigation.  Next glenoid vault was dried.  We trialed once again and were satisfied with the medium sized the glenoid.  Next, using antibodies cementing the superior and inferior holes the glenoid was cemented into place.  Of note, the center ball-like peg was filled  with humeral head autograft and not cemented per the manufacturer's recommendation.  Excellent fixation of the glenoid was obtained.   We then turned our attention back to the humeral side.  We reassessed our trial and were still  happy with the size 43.  The final implants were opened with a size 43 trunnion, and a medium size cage screw.  We impacted the trunnion into place.  We placed humeral head bone graft into the cage screw and this had excellent fixation.  We then trialed humeral heads.  We then trialed humeral heads and found the 43 x 16 mm humeral head to be satisfactory.  This demonstrated posterior pushback of just at 50% with spontaneous reduction and likewise inferior pull down of 50% with spontaneous reduction.  That head was then removed and the final humeral head was malleted in place with the above size.   Lastly we turned our attention to the subscapularis repair.  The shoulder was copiously irrigated once again with antibiotic solution.  We then placed 3 separate 2.6 mm fiber tack anchors along the lesser tuberosity for the medial row of this double row anchored repair technique.  We used a free needle to pass the sutures at the myotendinous junction.  We then loaded the tails of these in a knotless mechanism and two 3.9 mm swivel lock anchors for the lateral row fixation.  We predrilled and placed these with excellent compression of the tendon across the lesser tuberosity.  A #2 FiberWire was then used to close the rotator interval.  This was done in 45 of abduction and external rotation.  Patient's biceps tendon was abraded and fixed to the pectoralis major insertion with a #2 FiberWire in a figure-of-eight fashion.  The wound was once again copious bleed irrigated using antibiotic solution and then normal saline.  We evaluated once again for hemostasis.  The wound was closed in layers with a #2 FiberWire in the deltopectoral interval.  2-0 Vicryl for the deep dermal layer and 3-0 Monocryl in a subcuticular fashion followed by Dermabond glue.  A standard sterile occlusive dressing was applied as well as a postoperative sling.   The patient tolerated the procedure well.  All counts were correct 2.  There were no  intraoperative complications.  The patient was transferred to PACU in stable condition.   Disposition: The patient will be nonweightbearing to the operative extremity for proximally 6 weeks.  He will begin physical therapy in the outpatient setting.  He will be admitted for routine postoperative care including antibiotics, pain control, and DVT prophylaxis.  Sling will be maintained at all times.  Return for wound check in approximately 2 weeks and then again at 6 weeks.  POSTOPERATIVE PLAN: Admit for observation pain control.  Discharge home tomorrow.  Follow-up with me in 2 weeks.

## 2023-02-17 NOTE — Plan of Care (Signed)
  Problem: Education: Goal: Knowledge of General Education information will improve Description: Including pain rating scale, medication(s)/side effects and non-pharmacologic comfort measures Outcome: Progressing   Problem: Activity: Goal: Risk for activity intolerance will decrease Outcome: Progressing   Problem: Nutrition: Goal: Adequate nutrition will be maintained Outcome: Progressing   Problem: Elimination: Goal: Will not experience complications related to bowel motility Outcome: Progressing   Problem: Education: Goal: Ability to describe self-care measures that may prevent or decrease complications (Diabetes Survival Skills Education) will improve Outcome: Progressing   Problem: Nutritional: Goal: Maintenance of adequate nutrition will improve Outcome: Progressing

## 2023-02-17 NOTE — Transfer of Care (Signed)
Immediate Anesthesia Transfer of Care Note  Patient: Glen Guerrero.  Procedure(s) Performed: TOTAL SHOULDER ARTHROPLASTY (Right: Shoulder)  Patient Location: PACU  Anesthesia Type:General  Level of Consciousness: drowsy and patient cooperative  Airway & Oxygen Therapy: Patient Spontanous Breathing and Patient connected to face mask oxygen  Post-op Assessment: Report given to RN and Post -op Vital signs reviewed and stable  Post vital signs: Reviewed and stable  Last Vitals:  Vitals Value Taken Time  BP 129/66 02/17/23 0945  Temp    Pulse 67 02/17/23 0947  Resp 16 02/17/23 0947  SpO2 96 % 02/17/23 0947  Vitals shown include unfiled device data.  Last Pain:  Vitals:   02/17/23 0541  TempSrc: Oral  PainSc:          Complications: No notable events documented.

## 2023-02-17 NOTE — Progress Notes (Signed)
Report called to 3W nurse, Pennie Rushing

## 2023-02-18 DIAGNOSIS — M19011 Primary osteoarthritis, right shoulder: Secondary | ICD-10-CM | POA: Diagnosis not present

## 2023-02-18 LAB — GLUCOSE, CAPILLARY: Glucose-Capillary: 341 mg/dL — ABNORMAL HIGH (ref 70–99)

## 2023-02-18 NOTE — Discharge Summary (Signed)
Patient ID: Glen Guerrero. MRN: [id number] DOB/AGE: [birth year] 77 y.o.  Admit date: 02/17/2023 Discharge date: 02/18/2023  Primary Diagnosis: Right shoulder osteoarthritis  Admission Diagnoses: s/p Right total shoulder arthroplasty  Past Medical History:  Diagnosis Date   Arthritis    Carpal tunnel syndrome    Chronic kidney disease, stage 3a (HCC)    Diverticulitis    DM type 2 (diabetes mellitus, type 2) (HCC)    GERD (gastroesophageal reflux disease)    Gout    History of colon polyps    2015 polyp x 1 dysplastic and adenomatous pieces.   Hypercholesterolemia    Hypertension    Insomnia    Prostate cancer (HCC)    Prostate enlargement    Restless leg syndrome    Seasonal allergies    Skin cancer    Discharge Diagnoses:   Principal Problem:   History of total shoulder replacement, right  Estimated body mass index is 29.7 kg/m as calculated from the following:   Height as of this encounter: 6' (1.829 m).   Weight as of this encounter: 99.3 kg.  Procedure:  Procedure(s) (LRB): TOTAL SHOULDER ARTHROPLASTY (Right)   Consults: None  HPI: Glen Guerrero.  Is a 77 year old male presenting to the operating room for definitive treatment of his right shoulder osteoarthritis. He failed conservative treatments and was evaluated in the office. It was determined that we would proceed with shoulder replacement. He underwent surgery on 02/17/23 and was admitted for post operative monitoring and OT.   Laboratory Data: Admission on 02/17/2023, Discharged on 02/18/2023  Component Date Value Ref Range Status   Glucose-Capillary 02/17/2023 217 (H)  70 - 99 mg/dL Final   Glucose reference range applies only to samples taken after fasting for at least 8 hours.   Comment 1 02/17/2023 Notify RN   Final   Comment 2 02/17/2023 Document in Chart   Final   Glucose-Capillary 02/17/2023 185 (H)  70 - 99 mg/dL Final   Glucose reference range applies only to samples taken after  fasting for at least 8 hours.   Glucose-Capillary 02/17/2023 206 (H)  70 - 99 mg/dL Final   Glucose reference range applies only to samples taken after fasting for at least 8 hours.   Glucose-Capillary 02/17/2023 302 (H)  70 - 99 mg/dL Final   Glucose reference range applies only to samples taken after fasting for at least 8 hours.   Glucose-Capillary 02/17/2023 377 (H)  70 - 99 mg/dL Final   Glucose reference range applies only to samples taken after fasting for at least 8 hours.   Glucose-Capillary 02/17/2023 359 (H)  70 - 99 mg/dL Final   Glucose reference range applies only to samples taken after fasting for at least 8 hours.   Glucose-Capillary 02/18/2023 341 (H)  70 - 99 mg/dL Final   Glucose reference range applies only to samples taken after fasting for at least 8 hours.  Hospital Outpatient Visit on 02/06/2023  Component Date Value Ref Range Status   MRSA, PCR 02/06/2023 NEGATIVE  NEGATIVE Final   Staphylococcus aureus 02/06/2023 NEGATIVE  NEGATIVE Final   Comment: (NOTE) The Xpert SA Assay (FDA approved for NASAL specimens in patients 62 years of age and older), is one component of a comprehensive surveillance program. It is not intended to diagnose infection nor to guide or monitor treatment. Performed at [facility], [address] [place], [place] [address]    Hgb A1c MFr Bld 02/06/2023 7.6 (H)  4.8 - 5.6 % Final   Comment: (NOTE) Pre diabetes:          5.7%-6.4%  Diabetes:              >6.4%  Glycemic control for   <7.0% adults with diabetes    Mean Plasma Glucose 02/06/2023 171.42  mg/dL Final   Performed at [facility], [address] [place], [place] [address]   Sodium 02/06/2023 138  135 - 145 mmol/L Final   Potassium 02/06/2023 4.5  3.5 - 5.1 mmol/L Final   Chloride 02/06/2023 105  98 - 111 mmol/L Final   CO2 02/06/2023 25  22 - 32 mmol/L Final   Glucose, Bld 02/06/2023 146 (H)  70 - 99 mg/dL Final   Glucose reference range  applies only to samples taken after fasting for at least 8 hours.   BUN 02/06/2023 32 (H)  8 - 23 mg/dL Final   Creatinine, Ser 02/06/2023 1.63 (H)  0.61 - 1.24 mg/dL Final   Calcium 60/45/4098 9.2  8.9 - 10.3 mg/dL Final   GFR, Estimated 02/06/2023 44 (L)  >60 mL/min Final   Comment: (NOTE) Calculated using the CKD-EPI Creatinine Equation (2021)    Anion gap 02/06/2023 8  5 - 15 Final   Performed at [facility], [address], [place], [place] [address]   WBC 02/06/2023 5.8  4.0 - 10.5 K/uL Final   RBC 02/06/2023 5.00  4.22 - 5.81 MIL/uL Final   Hemoglobin 02/06/2023 15.4  13.0 - 17.0 g/dL Final   HCT 78/29/5621 46.7  39.0 - 52.0 % Final   MCV 02/06/2023 93.4  80.0 - 100.0 fL Final   MCH 02/06/2023 30.8  26.0 - 34.0 pg Final   MCHC 02/06/2023 33.0  30.0 - 36.0 g/dL Final   RDW 30/86/5784 13.2  11.5 - 15.5 % Final   Platelets 02/06/2023 266  150 - 400 K/uL Final   nRBC 02/06/2023 0.0  0.0 - 0.2 % Final   Performed at [facility], [address], [place], [place] [address]   Glucose-Capillary 02/06/2023 157 (H)  70 - 99 mg/dL Final   Glucose reference range applies only to samples taken after fasting for at least 8 hours.     X-Rays:DG Shoulder Right Port  Result Date: 02/17/2023 CLINICAL DATA:  Right total shoulder replacement. EXAM: RIGHT SHOULDER - 1 VIEW COMPARISON:  None Available. FINDINGS: The right glenoid and humeral components are well situated. No fracture or dislocation is noted. IMPRESSION: Status post right total shoulder arthroplasty. Electronically Signed   By: Lupita Raider M.D.   On: 02/17/2023 14:00    EKG: Orders placed or performed during the hospital encounter of 02/06/23   EKG 12 lead per protocol   EKG 12 lead per protocol      Physical exam : NAD, sling on. C/D/I dressing, painless passive elbow ROM. Mild decreased sensation secondary to block. Intact deltoid sensation. Radial pulse 2+   Hospital Course: Glen Guerrero. is a 77 y.o. who was admitted to Hospital. They were brought to the operating room on 02/17/2023 and underwent Procedure(s): TOTAL SHOULDER ARTHROPLASTY.  Patient tolerated the procedure well and was later transferred to the recovery room and then to the orthopaedic floor for postoperative care.  They were given PO and IV analgesics for pain control following their surgery.  They were given 24 hours of postoperative antibiotics of  Anti-infectives (From admission, onward)    Start  Dose/Rate Route Frequency Ordered Stop   02/17/23 1400  ceFAZolin (ANCEF) IVPB 2g/100 mL premix        2 g 200 mL/hr over 30 Minutes Intravenous Every 6 hours 02/17/23 1356 02/18/23 0510   02/17/23 0804  vancomycin (VANCOCIN) powder  Status:  Discontinued          As needed 02/17/23 0804 02/17/23 1356   02/17/23 0600  ceFAZolin (ANCEF) IVPB 2g/100 mL premix        2 g 200 mL/hr over 30 Minutes Intravenous On call to O.R. 02/17/23 4401 02/17/23 0747      and started on DVT prophylaxis in the form of Aspirin.OT was ordered for total joint protocol.  Discharge planning consulted to help with postop disposition and equipment needs.  Patient had an uneventful night on the evening of surgery.  They started to get up OOB with therapy on day one. .the patient had progressed with therapy and meeting their goals.  Incision was healing well.  Patient was seen in rounds and was ready to go home.   Diet: Regular diet Activity:NWB Follow-up:in 2 weeks Disposition - Home Discharged Condition: good   Discharge Instructions     Call MD / Call 911   Complete by: As directed    If you experience chest pain or shortness of breath, CALL 911 and be transported to the hospital emergency room.  If you develope a fever above 101 F, pus (white drainage) or increased drainage or redness at the wound, or calf pain, call your surgeon's office.   Constipation Prevention   Complete by: As directed    Drink plenty of  fluids.  Prune juice may be helpful.  You may use a stool softener, such as Colace (over the counter) 100 mg twice a day.  Use MiraLax (over the counter) for constipation as needed.   Diet - low sodium heart healthy   Complete by: As directed    Increase activity slowly as tolerated   Complete by: As directed    Post-operative opioid taper instructions:   Complete by: As directed    POST-OPERATIVE OPIOID TAPER INSTRUCTIONS: It is important to wean off of your opioid medication as soon as possible. If you do not need pain medication after your surgery it is ok to stop day one. Opioids include: Codeine, Hydrocodone(Norco, Vicodin), Oxycodone(Percocet, oxycontin) and hydromorphone amongst others.  Long term and even short term use of opiods can cause: Increased pain response Dependence Constipation Depression Respiratory depression And more.  Withdrawal symptoms can include Flu like symptoms Nausea, vomiting And more Techniques to manage these symptoms Hydrate well Eat regular healthy meals Stay active Use relaxation techniques(deep breathing, meditating, yoga) Do Not substitute Alcohol to help with tapering If you have been on opioids for less than two weeks and do not have pain than it is ok to stop all together.  Plan to wean off of opioids This plan should start within one week post op of your joint replacement. Maintain the same interval or time between taking each dose and first decrease the dose.  Cut the total daily intake of opioids by one tablet each day Next start to increase the time between doses. The last dose that should be eliminated is the evening dose.         Allergies as of 02/18/2023       Reactions   Mirapex  [pramipexole Dihydrochloride] Other (See Comments)   Other Other (See Comments)   Pramipexole Other (See Comments)  Ciprofloxacin Nausea Only   Lightheaded   Flagyl [metronidazole] Other (See Comments)   Can't remember symptoms   Ibuprofen     Propranolol Diarrhea   Tramadol Nausea And Vomiting   Can't take any medication similar to Tramadol        Medication List     TAKE these medications    amLODipine 10 MG tablet Commonly known as: NORVASC Take 10 mg by mouth daily.   aspirin EC 81 MG tablet Take 81 mg by mouth daily.   atorvastatin 20 MG tablet Commonly known as: LIPITOR Take 20 mg by mouth daily.   BERBERINE CHLORIDE PO Take 600 mg by mouth 2 (two) times daily.   cephALEXin 250 MG capsule Commonly known as: KEFLEX Take 1 capsule by mouth 4 (four) times daily.   CHERRY PO Take 1,000 mg by mouth 2 (two) times daily.   Cholecalciferol 25 MCG (1000 UT) capsule Take 1,000 Units by mouth daily.   ezetimibe 10 MG tablet Commonly known as: ZETIA Take 10 mg by mouth daily.   fenofibrate micronized 134 MG capsule Commonly known as: LOFIBRA Take 134 mg by mouth daily.   Fish Oil 1000 MG Caps Take 1,000 mg by mouth in the morning and at bedtime.   fluorouracil 5 % cream Commonly known as: EFUDEX Apply 1 application  topically daily as needed (irritation).   glipiZIDE 10 MG tablet Commonly known as: GLUCOTROL Take 10 mg by mouth daily before breakfast.   Horizant 600 MG Tbcr Generic drug: Gabapentin Enacarbil Take 600 mg by mouth in the morning and at bedtime.   hydrALAZINE 50 MG tablet Commonly known as: APRESOLINE Take 50 mg by mouth 2 (two) times daily.   HYDROcodone-acetaminophen 5-325 MG tablet Commonly known as: NORCO/VICODIN Take 1 tablet by mouth every 6 (six) hours as needed for moderate pain.   Jardiance 25 MG Tabs tablet Generic drug: empagliflozin Take 25 mg by mouth daily.   Kerendia 10 MG Tabs Generic drug: Finerenone Take 10 mg by mouth every other day.   Lantus SoloStar 100 UNIT/ML Solostar Pen Generic drug: insulin glargine Inject 38 Units into the skin 2 (two) times daily.   magnesium gluconate 500 MG tablet Commonly known as: MAGONATE Take 500 mg by mouth  daily.   NON FORMULARY Uses CPAP nightly   NovoLOG FlexPen 100 UNIT/ML FlexPen Generic drug: insulin aspart Inject 8 Units into the skin 3 (three) times daily with meals.   ondansetron 4 MG tablet Commonly known as: Zofran Take 1 tablet (4 mg total) by mouth every 8 (eight) hours as needed for nausea or vomiting.   oxyCODONE 5 MG immediate release tablet Commonly known as: Roxicodone Take 1 tablet (5 mg total) by mouth every 4 (four) hours as needed for moderate pain (pain score 4-6).   [facility] Caps Take 1 capsule by mouth daily.   pregabalin 50 MG capsule Commonly known as: LYRICA Take 50 mg by mouth in the morning and at bedtime.   rOPINIRole 1 MG tablet Commonly known as: REQUIP Take 1-2 mg by mouth See admin instructions. Take 1 mg at by mouth in the afternoon and take 2 mg at night   Turmeric 500 MG Tabs Take 500 mg by mouth daily.   valsartan 40 MG tablet Commonly known as: DIOVAN Take 40 mg by mouth daily.        Follow-up Information     Yolonda Kida, MD Follow up in 2 week(s).   Specialty: Orthopedic Surgery  Why: For wound re-check Contact information: [address] [place] [address] [phone]                 Signed: Dion Saucier PA-C Orthopaedic Surgery 02/18/2023, 3:16 PM

## 2023-02-18 NOTE — Plan of Care (Signed)
  Problem: Education: Goal: Knowledge of General Education information will improve Description: Including pain rating scale, medication(s)/side effects and non-pharmacologic comfort measures Outcome: Adequate for Discharge   Problem: Health Behavior/Discharge Planning: Goal: Ability to manage health-related needs will improve Outcome: Adequate for Discharge   Problem: Clinical Measurements: Goal: Ability to maintain clinical measurements within normal limits will improve Outcome: Adequate for Discharge Goal: Will remain free from infection Outcome: Adequate for Discharge Goal: Diagnostic test results will improve Outcome: Adequate for Discharge Goal: Respiratory complications will improve Outcome: Adequate for Discharge Goal: Cardiovascular complication will be avoided Outcome: Adequate for Discharge   Problem: Activity: Goal: Risk for activity intolerance will decrease Outcome: Adequate for Discharge   Problem: Nutrition: Goal: Adequate nutrition will be maintained Outcome: Adequate for Discharge   Problem: Coping: Goal: Level of anxiety will decrease Outcome: Adequate for Discharge   Problem: Elimination: Goal: Will not experience complications related to bowel motility Outcome: Adequate for Discharge Goal: Will not experience complications related to urinary retention Outcome: Adequate for Discharge   Problem: Pain Management: Goal: General experience of comfort will improve Outcome: Adequate for Discharge   Problem: Safety: Goal: Ability to remain free from injury will improve Outcome: Adequate for Discharge   Problem: Education: Goal: Ability to describe self-care measures that may prevent or decrease complications (Diabetes Survival Skills Education) will improve Outcome: Adequate for Discharge Goal: Individualized Educational Video(s) Outcome: Adequate for Discharge   Problem: Coping: Goal: Ability to adjust to condition or change in health will  improve Outcome: Adequate for Discharge   Problem: Fluid Volume: Goal: Ability to maintain a balanced intake and output will improve Outcome: Adequate for Discharge   Problem: Health Behavior/Discharge Planning: Goal: Ability to identify and utilize available resources and services will improve Outcome: Adequate for Discharge Goal: Ability to manage health-related needs will improve Outcome: Adequate for Discharge   Problem: Metabolic: Goal: Ability to maintain appropriate glucose levels will improve Outcome: Adequate for Discharge   Problem: Nutritional: Goal: Maintenance of adequate nutrition will improve Outcome: Adequate for Discharge Goal: Progress toward achieving an optimal weight will improve Outcome: Adequate for Discharge   Problem: Tissue Perfusion: Goal: Adequacy of tissue perfusion will improve Outcome: Adequate for Discharge   Problem: Education: Goal: Knowledge of the prescribed therapeutic regimen will improve Outcome: Adequate for Discharge Goal: Understanding of activity limitations/precautions following surgery will improve Outcome: Adequate for Discharge Goal: Individualized Educational Video(s) Outcome: Adequate for Discharge   Problem: Activity: Goal: Ability to tolerate increased activity will improve Outcome: Adequate for Discharge   Problem: Pain Management: Goal: Pain level will decrease with appropriate interventions Outcome: Adequate for Discharge

## 2023-02-18 NOTE — Evaluation (Signed)
Occupational Therapy Evaluation Patient Details Name: Glen Guerrero. MRN: [id number] DOB: 1946-01-27 Today's Date: 02/18/2023   History of Present Illness Patient is a 77 year old male who presented with right shoulder osteoarthritis. Patient underwent right total shoulder arthroplasty. ONG:[id number], DM II, insomnia, prostate CA, restless leg syndrome, CKD, carpal tunnel with release L side, trigger finger.   Clinical Impression   Patient is a 77 year old male who s/p shoulder replacement without functional use of RUE secondary to effects of surgery and interscalene block and shoulder precautions. Therapist provided education and instruction to patient and wife in regards to exercises, precautions, positioning, donning upper extremity clothing and bathing while maintaining shoulder precautions, ice and edema management, use of ice machine and donning/doffing sling. Patient and wife verbalized understanding and demonstrated as needed. Patient needed assistance to donn shirt, underwear, pants, socks and shoes and provided with instruction on compensatory strategies to perform ADLs. Patient to follow up with MD for further therapy needs.         If plan is discharge home, recommend the following: A lot of help with bathing/dressing/bathroom;Assistance with cooking/housework;Assist for transportation;Help with stairs or ramp for entrance;Direct supervision/assist for medications management    Functional Status Assessment  Patient has had a recent decline in their functional status and demonstrates the ability to make significant improvements in function in a reasonable and predictable amount of time.  Equipment Recommendations  None recommended by OT       Precautions / Restrictions Precautions Precautions: Shoulder Type of Shoulder Precautions: no shoulder ROM. ok for hand wrist and elbow Shoulder Interventions: Shoulder sling/immobilizer;At all times;Off for  dressing/bathing/exercises;Shoulder abduction pillow;Don joy ultra sling Precaution Booklet Issued: Yes (comment) Required Braces or Orthoses: Sling Restrictions Weight Bearing Restrictions: Yes RUE Weight Bearing: Non weight bearing      Mobility Bed Mobility               General bed mobility comments: patient up in recliner and plans to sleep in recliner at home         Balance Overall balance assessment: Mild deficits observed, not formally tested       ADL either performed or assessed with clinical judgement   ADL Overall ADL's : Needs assistance/impaired Eating/Feeding: Modified independent;Sitting   Grooming: Sitting;Supervision/safety;Set up               Lower Body Dressing: Maximal assistance;Sit to/from stand;Sitting/lateral leans Lower Body Dressing Details (indicate cue type and reason): donning dresspants with belt. ed on wearing draw string/elastic waist and pants. patient and wife verbalized understanding. Toilet Transfer: Supervision/safety;Set up   Toileting- Clothing Manipulation and Hygiene: Moderate assistance;Sit to/from stand               Vision Baseline Vision/History: 1 Wears glasses              Pertinent Vitals/Pain Pain Assessment Pain Assessment: Faces Faces Pain Scale: Hurts little more Pain Location: R shoulder Pain Descriptors / Indicators: Discomfort, Grimacing Pain Intervention(s): Limited activity within patient's tolerance, Monitored during session, Repositioned, Premedicated before session, Ice applied     Extremity/Trunk Assessment Upper Extremity Assessment Upper Extremity Assessment: RUE deficits/detail RUE Deficits / Details: shoulder surgery with ROM restrictions in place. still feels numbness in thumb. able to move hand and wrist minimally RUE: Unable to fully assess due to immobilization   Lower Extremity Assessment Lower Extremity Assessment: Overall WFL for tasks assessed   Cervical / Trunk  Assessment Cervical / Trunk Assessment: Back Surgery  Cognition Arousal: Alert Behavior During Therapy: WFL for tasks assessed/performed Overall Cognitive Status: Within Functional Limits for tasks assessed         General Comments: wife was present as well.                Home Living Family/patient expects to be discharged to:: Private residence Living Arrangements: Spouse/significant other Available Help at Discharge: Family;Available PRN/intermittently          Prior Functioning/Environment Prior Level of Function : Independent/Modified Independent                        OT Problem List: Decreased activity tolerance;Impaired balance (sitting and/or standing);Decreased coordination;Decreased safety awareness;Decreased knowledge of precautions      OT Treatment/Interventions:      OT Goals(Current goals can be found in the care plan section) Acute Rehab OT Goals Patient Stated Goal: to go home OT Goal Formulation: All assessment and education complete, DC therapy  OT Frequency:         AM-PAC OT "6 Clicks" Daily Activity     Outcome Measure Help from another person eating meals?: A Little Help from another person taking care of personal grooming?: A Little Help from another person toileting, which includes using toliet, bedpan, or urinal?: A Little Help from another person bathing (including washing, rinsing, drying)?: A Little Help from another person to put on and taking off regular upper body clothing?: A Lot Help from another person to put on and taking off regular lower body clothing?: A Lot 6 Click Score: 16   End of Session Equipment Utilized During Treatment: Other (comment) (ultra joy sling) Nurse Communication: Mobility status  Activity Tolerance: Patient tolerated treatment well Patient left: in chair;with call bell/phone within reach;with family/visitor present  OT Visit Diagnosis: Unsteadiness on feet (R26.81);Pain Pain -  Right/Left: Right Pain - part of body: Shoulder                Time: 6213-0865 OT Time Calculation (min): 48 min Charges:  OT General Charges $OT Visit: 1 Visit OT Evaluation $OT Eval Low Complexity: 1 Low OT Treatments $Self Care/Home Management : 23-37 mins  Fredi Hurtado OTR/L, MS Acute Rehabilitation Department Office# [phone]   Selinda Flavin 02/18/2023, 9:06 AM

## 2023-02-20 ENCOUNTER — Encounter (HOSPITAL_COMMUNITY): Payer: Self-pay | Admitting: Orthopedic Surgery

## 2023-02-23 ENCOUNTER — Other Ambulatory Visit: Payer: Medicare Other

## 2023-03-02 ENCOUNTER — Ambulatory Visit: Payer: Medicare Other | Admitting: Urology

## 2023-03-09 ENCOUNTER — Ambulatory Visit: Payer: Medicare Other | Admitting: Urology

## 2023-03-09 ENCOUNTER — Encounter: Payer: Self-pay | Admitting: Urology

## 2023-03-09 VITALS — BP 151/82 | HR 63

## 2023-03-09 DIAGNOSIS — N401 Enlarged prostate with lower urinary tract symptoms: Secondary | ICD-10-CM | POA: Diagnosis not present

## 2023-03-09 DIAGNOSIS — E291 Testicular hypofunction: Secondary | ICD-10-CM | POA: Diagnosis not present

## 2023-03-09 DIAGNOSIS — R351 Nocturia: Secondary | ICD-10-CM

## 2023-03-09 DIAGNOSIS — N138 Other obstructive and reflux uropathy: Secondary | ICD-10-CM

## 2023-03-09 DIAGNOSIS — Z8546 Personal history of malignant neoplasm of prostate: Secondary | ICD-10-CM | POA: Diagnosis not present

## 2023-03-09 LAB — URINALYSIS, ROUTINE W REFLEX MICROSCOPIC
Bilirubin, UA: NEGATIVE
Ketones, UA: NEGATIVE
Leukocytes,UA: NEGATIVE
Nitrite, UA: NEGATIVE
RBC, UA: NEGATIVE
Specific Gravity, UA: 1.015 (ref 1.005–1.030)
Urobilinogen, Ur: 0.2 mg/dL (ref 0.2–1.0)
pH, UA: 6 (ref 5.0–7.5)

## 2023-03-09 NOTE — Progress Notes (Signed)
Subjective:  1. 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 BPH with urinary obstruction   4. Nocturia     I have prostate cancer. HPI: Glen Guerrero is a 77 year-old male established patient who is here evaluation for treatment of prostate cancer.  His prostate cancer was diagnosed 01/04/2019. He does have the pathology report from his biopsy. His PSA at his time of diagnosis was 3.7.   03/09/23: Glen Guerrero returns  today in f/u.  His PSA is stable at 0.2 on 03/06/23 but his T was down to 117.  He had back surgery and then 3 weeks later a right shoulder replacement.  He has been on hydrocodone and more recently oxycodone since.  He is voiding well with an IPSS of 4 and nocturia x 2.  He has had no hematuria or GI bleed.  He has no weight loss or bone pain.    08/25/22: Glen Guerrero returns today in f/u. HIs PSA is stable at 0.2 with a further increase in the T at 151.   He has stable LUTS with an IPSS of 9.  He has nocturia x 1 but intermittency prior to bed.  He has a reduced stream.  He has 3+ glucose on Jardiance.  He has no GI complaints.  He is gaining weight and is up 5-6 lbs.  He has no bone pain.   02/24/22: Glen Guerrero returns today in f/u.  He last had Eligard 30mg  on 08/09/19.  He completed EXRT in 2/21.  He had T2a GG3 prostate cancer on his pretreatment biopsy.   He had an episode of retention in September from constipation but is voiding well now with an IPSS of 7 and a PVR of 25ml.  He had labs on 02/18/22 and his T is up to 155 and his PSA remains low at 0.2.  He had some hematuria with a UTI after the foley was removed and he had 3 rounds of antibiotics with the last dose last week. He is having some weight gain.  He has chronic back pain but no bone pain.  He is no GI issues today.   08/26/21: Glen Guerrero returns today in f/u for the history below.  His PSA is stable at 0.2.  His T is up slightly to 104.  He is voiding ok with an IPSS of 8 and nocturia x 2.  He has no hot flashes.  His  weight is up some.  He has good energy and strength particularly over the last month.  He has persistent ED.  He has had no hematuria.   He has no GI complaints.   He no bone pain.    02/25/21: Glen Guerrero returns for the history noted below.  His PSA on 02/05/21 was 0.2 which is up from <0.1 in 4/22.  The testosterone remains low at 85.  He last had Eligard 30mg  on 08/09/19.  He completed EXRT in 2/21.  He had T2a GG3 prostate cancer on his pretreatment biopsy.  He is voiding wel but has nocturia x 2 and some intermittency.  His IPSS is 5.  He is taking ambien for sleep issues related to restless leg.  He has lost a couple of lbs.  He has no GI complaints but he had a SBO 5 weeks ago that resolved with conservative management.  He has ED and didn't have a good response to PDE5's.   He had headaches with it.   He is not having hot flashes  but he has malaise in the afternoon and has chronic myalgias.    He has been seen at the [facility] for his T2a Gleason 7(4+3) prostate cancer. He has elected to proceed with EXRT with adjuvanct ADT for 6-8 months. He will be getting his EXRT at [facility] and I will await their input regarding the need for [employer] and fiducials. He was initially seen for BPH with BOO and was found to have an 8mm right base nodule with a PSA of 3.7 which was from 10/19 prior to the initiation of finasteride which he is currently taking. He had a biopsy that demonstrated a 32ml prostate with a T2a N0 M0 Gleason 7(4+3) prostate cancer. The right base and mid lateral cores had Gleason 7(4+3) up to 90% involvement. The remaining 4 right cores had 7(3+4) disease up to 85% involvement. 3/6 left cores had low volume Gleason 6 disease. The bone scan is negative and a CT showed some small subpleural nodules favored to be nodes but no other evidence of metastatic disease. His comorbidities include HTN, hyperlipidemia, and diabetes. His voiding symptoms have improved since his initial visit.   [facility]  nomogram predicts. 28% OCD, 69% ECE, 19% LNI and 20% SVI.    IPSS     Row Name 03/09/23 1100         International Prostate Symptom Score   How often have you had the sensation of not emptying your bladder? Less than 1 in 5     How often have you had to urinate less than every two hours? Less than 1 in 5 times     How often have you found you stopped and started again several times when you urinated? Not at All     How often have you found it difficult to postpone urination? Not at All     How often have you had a weak urinary stream? Not at All     How often have you had to strain to start urination? Not at All     How many times did you typically get up at night to urinate? 2 Times     Total IPSS Score 4       Quality of Life due to urinary symptoms   If you were to spend the rest of your life with your urinary condition just the way it is now how would you feel about that? Mostly Satisfied                  ROS:  ROS:  Review of Systems  Musculoskeletal:  Positive for joint pain.  All other systems reviewed and are negative.       Allergies  Allergen Reactions   Mirapex  [Pramipexole Dihydrochloride] Other (See Comments)   Other Other (See Comments)   Pramipexole Other (See Comments)   Ciprofloxacin Nausea Only    Lightheaded   Flagyl [Metronidazole] Other (See Comments)    Can't remember symptoms   Ibuprofen    Propranolol Diarrhea   Tramadol Nausea And Vomiting    Can't take any medication similar to Tramadol    Outpatient Encounter Medications as of 03/09/2023  Medication Sig   amLODipine (NORVASC) 10 MG tablet Take 10 mg by mouth daily.   aspirin EC 81 MG tablet Take 81 mg by mouth daily.   atorvastatin (LIPITOR) 20 MG tablet Take 20 mg by mouth daily.   BERBERINE CHLORIDE PO Take 600 mg by mouth 2 (two) times daily.   CHERRY PO  Take 1,000 mg by mouth 2 (two) times daily.   Cholecalciferol 25 MCG (1000 UT) capsule Take 1,000 Units by mouth daily.    ezetimibe (ZETIA) 10 MG tablet Take 10 mg by mouth daily.   fenofibrate micronized (LOFIBRA) 134 MG capsule Take 134 mg by mouth daily.   Finerenone (KERENDIA) 10 MG TABS Take 10 mg by mouth every other day.   fluorouracil (EFUDEX) 5 % cream Apply 1 application  topically daily as needed (irritation).   Gabapentin Enacarbil (HORIZANT) 600 MG TBCR Take 600 mg by mouth in the morning and at bedtime.   glipiZIDE (GLUCOTROL) 10 MG tablet Take 10 mg by mouth daily before breakfast.    HYDROcodone-acetaminophen (NORCO/VICODIN) 5-325 MG tablet Take 1 tablet by mouth every 6 (six) hours as needed for moderate pain.   JARDIANCE 25 MG TABS tablet Take 25 mg by mouth daily.   LANTUS SOLOSTAR 100 UNIT/ML Solostar Pen Inject 38 Units into the skin 2 (two) times daily.   NON FORMULARY Uses CPAP nightly   NOVOLOG FLEXPEN 100 UNIT/ML FlexPen Inject 8 Units into the skin 3 (three) times daily with meals.   Omega-3 Fatty Acids (FISH OIL) 1000 MG CAPS Take 1,000 mg by mouth in the morning and at bedtime.   ondansetron (ZOFRAN) 4 MG tablet Take 1 tablet (4 mg total) by mouth every 8 (eight) hours as needed for nausea or vomiting.   oxyCODONE (ROXICODONE) 5 MG immediate release tablet Take 1 tablet (5 mg total) by mouth every 4 (four) hours as needed for moderate pain (pain score 4-6).   pregabalin (LYRICA) 50 MG capsule Take 50 mg by mouth in the morning and at bedtime.   Probiotic Product (PHILLIPS COLON HEALTH) CAPS Take 1 capsule by mouth daily.   rOPINIRole (REQUIP) 1 MG tablet Take 1-2 mg by mouth See admin instructions. Take 1 mg at by mouth in the afternoon and take 2 mg at night   Turmeric 500 MG TABS Take 500 mg by mouth daily.   valsartan (DIOVAN) 40 MG tablet Take 40 mg by mouth daily.   hydrALAZINE (APRESOLINE) 50 MG tablet Take 50 mg by mouth 2 (two) times daily.   [DISCONTINUED] cephALEXin (KEFLEX) 250 MG capsule Take 1 capsule by mouth 4 (four) times daily. (Patient not taking: Reported on  03/09/2023)   [DISCONTINUED] magnesium gluconate (MAGONATE) 500 MG tablet Take 500 mg by mouth daily. (Patient not taking: Reported on 03/09/2023)   No facility-administered encounter medications on file as of 03/09/2023.    Past Medical History:  Diagnosis Date   Arthritis    Carpal tunnel syndrome    Chronic kidney disease, stage 3a (HCC)    Diverticulitis    DM type 2 (diabetes mellitus, type 2) (HCC)    GERD (gastroesophageal reflux disease)    Gout    History of colon polyps    2015 polyp x 1 dysplastic and adenomatous pieces.   Hypercholesterolemia    Hypertension    Insomnia    Prostate cancer [facility])    Prostate enlargement    Restless leg syndrome    Seasonal allergies    Skin cancer     Past Surgical History:  Procedure Laterality Date   APPENDECTOMY     CARPAL TUNNEL RELEASE Left 08/12/2021   COLONOSCOPY  04/25/2013   Dr.Spainhour- polypectomy x 1 pieces dysplastic and adenomatous; Recc repeat in 2020 (5 years).   COLONOSCOPY N/A 12/05/2017   Procedure: COLONOSCOPY;  Surgeon: Corbin Ade, MD;  Location: [facility]  ENDO SUITE;  Service: Endoscopy;  Laterality: N/A;  11:15am   HERNIA REPAIR     inguinal x2   POLYPECTOMY  12/05/2017   Procedure: POLYPECTOMY;  Surgeon: Corbin Ade, MD;  Location: [facility] ENDO SUITE;  Service: Endoscopy;;  colon    TONSILLECTOMY     TOTAL SHOULDER ARTHROPLASTY Right 02/17/2023   Procedure: TOTAL SHOULDER ARTHROPLASTY;  Surgeon: Yolonda Kida, MD;  Location: [facility] ORS;  Service: Orthopedics;  Laterality: Right;   TRIGGER FINGER RELEASE      Social History   Socioeconomic History   Marital status: Married    Spouse name: Dois Davenport   Number of children: 3   Years of education: Not on file   Highest education level: Not on file  Occupational History   Not on file  Tobacco Use   Smoking status: Former    Types: Cigars    Start date: 04/12/1969    Quit date: 04/25/2021    Years since quitting: 1.8   Smokeless tobacco: Former     Quit date: 05/20/1979   Tobacco comments:    5 cigars per week  Vaping Use   Vaping status: Never Used  Substance and Sexual Activity   Alcohol use: Not Currently   Drug use: Yes    Comment: has a cookie/brownie with marijuana in it nightly that helps him sleep   Sexual activity: Yes  Other Topics Concern   Not on file  Social History Narrative   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   Right handed.    Social Determinants of Health   Financial Resource Strain: Low Risk  (07/10/2019)   Received from [facility], [facility]   Overall Financial Resource Strain (CARDIA)    Difficulty of Paying Living Expenses: Not hard at all  Food Insecurity: No Food Insecurity (02/17/2023)   Hunger Vital Sign    Worried About Running Out of Food in the Last Year: Never true    Ran Out of Food in the Last Year: Never true  Transportation Needs: No Transportation Needs (02/17/2023)   PRAPARE - Administrator, Civil Service (Medical): No    Lack of Transportation (Non-Medical): No  Physical Activity: Unknown (07/10/2019)   Received from [facility], [facility]   Exercise Vital Sign    Days of Exercise per Week: Patient declined    Minutes of Exercise per Session: Patient declined  Stress: No Stress Concern Present (07/10/2019)   Received from [facility], [facility] - Occupational Stress Questionnaire    Feeling of Stress : Only a little  Social Connections: Unknown (07/10/2019)   Received from [facility], [facility]   Social Connection and Isolation Panel [NHANES]    Frequency of Communication with Friends and Family: Patient declined    Frequency of Social Gatherings with Friends and Family: Patient declined    Attends Religious Services: Patient declined    Database administrator or Organizations: Patient declined    Attends Tax inspector Meetings: Patient declined    Marital Status: Patient declined  Intimate Partner Violence: Not At Risk (02/17/2023)   Humiliation, Afraid, Rape, and Kick questionnaire    Fear of Current or Ex-Partner: No    Emotionally Abused: No    Physically Abused: No    Sexually Abused: No    Family History  Problem Relation Age of Onset   Hypertension Mother    Heart failure Mother    Prostate cancer Father    Hypertension Child    Hypertension Child    Hypertension Child    High Cholesterol Child    High Cholesterol Child    High Cholesterol Child    Cancer Paternal Aunt        unknown type   Cancer Paternal Aunt        unknown type   Colon cancer Neg Hx        Objective: Vitals:   03/09/23 1123  BP: (!) 151/82  Pulse: 63     Physical Exam Vitals reviewed.  Constitutional:      Appearance: Normal appearance.  Neurological:     Mental Status: He is alert.     Lab Results:  Results for orders placed or performed in visit on 03/09/23 (from the past 24 hour(s))  Urinalysis, Routine w reflex microscopic     Status: Abnormal   Collection Time: 03/09/23 11:27 AM  Result Value Ref Range   Specific Gravity, UA 1.015 1.005 - 1.030   pH, UA 6.0 5.0 - 7.5   Color, UA Yellow Yellow   Appearance Ur Clear Clear   Leukocytes,UA Negative Negative   Protein,UA Trace Negative/Trace   Glucose, UA 3+ (A) Negative   Ketones, UA Negative Negative   RBC, UA Negative Negative   Bilirubin, UA Negative Negative   Urobilinogen, Ur 0.2 0.2 - 1.0 mg/dL   Nitrite, UA Negative Negative   Microscopic Examination Comment    Narrative   Performed at:  [address] - [facility] [address] [place], [place]  [id number] Lab Director: Chinita Pester MT, Phone:  [phone]     Lab Results  Component Value Date   PSA1 0.2 08/11/2022   PSA1 0.2 08/23/2021   PSA1 0.2 05/26/2021       BMET No results for input(s): "NA", "K", "CL", "CO2", "GLUCOSE", "BUN", "CREATININE",  "CALCIUM" in the last 72 hours. PSA PSA  Date Value Ref Range Status  07/16/2019 <0.1 < OR = 4.0 ng/mL Final    Comment:    The total PSA value from this assay system is  standardized against the WHO standard. The test  result will be approximately 20% lower when compared  to the equimolar-standardized total PSA (Beckman  Coulter). Comparison of serial PSA results should be  interpreted with this fact in mind. . This test was performed using the Siemens  chemiluminescent method. Values obtained from  different assay methods cannot be used interchangeably. PSA levels, regardless of value, should not be interpreted as absolute evidence of the presence or absence of disease.    Testosterone  Date Value Ref Range Status  08/11/2022 151 (L) 264 - 916 ng/dL Final    Comment:    Adult male reference interval is based on a population of healthy nonobese males (BMI <30) between 3 and 20 years old. Travison, et.al. JCEM [id number]. PMID: 69629528.   08/23/2021 104 (L) 264 - 916 ng/dL Final    Comment:    Adult male reference interval is based on a population of healthy nonobese males (BMI <30) between 83 and 38 years old. Travison, et.al. JCEM [phone]. PMID: 64403474.   02/05/2021 85 (L) 264 - 916 ng/dL Final    Comment:    Adult male reference interval is based on a population of healthy nonobese males (BMI <30) between 50 and 2 years old. Travison, et.al. JCEM [phone].  PMID: 16109604.     Lab Results  Component Value Date   PSA1 0.2 08/11/2022    08/25/22: : PSA 0.2 and T 151 03/06/23: PSA  0.2 and T 117.  UA is clear with 3+ glucose.   Studies/Results: .      Assessment & Plan: Prostate cancer.  His PSA is stable and low.  PSA in 6 months.   Hypogonadism.  His T is down but he has been on narcotics for several weeks.   I will repeat in 6 months.      BPH with BOO and Nocturia.  He is content with his voiding symptom on no therapy.    ED.  He failed oral therapy and is not interested in other treatment at this time.   .     No orders of the defined types were placed in this encounter.    Orders Placed This Encounter  Procedures   Urinalysis, Routine w reflex microscopic   Testosterone    Standing Status:   Future    Number of Occurrences:   1    Standing Expiration Date:   03/08/2024   PSA    Standing Status:   Future    Number of Occurrences:   1    Standing Expiration Date:   03/08/2024      Return in about 6 months (around 09/06/2023).   CC: Benita Stabile, MD      Bjorn Pippin 03/10/2023 Patient ID: Odessa Fleming., male   DOB: 19-Jun-1945, 77 y.o.   MRN: [id number]

## 2023-04-03 ENCOUNTER — Ambulatory Visit: Payer: Medicare Other | Attending: Cardiology | Admitting: Cardiology

## 2023-04-03 ENCOUNTER — Encounter: Payer: Self-pay | Admitting: Cardiology

## 2023-04-03 VITALS — BP 138/70 | HR 57 | Ht 72.0 in | Wt 228.0 lb

## 2023-04-03 DIAGNOSIS — R0609 Other forms of dyspnea: Secondary | ICD-10-CM | POA: Diagnosis present

## 2023-04-03 DIAGNOSIS — I10 Essential (primary) hypertension: Secondary | ICD-10-CM | POA: Insufficient documentation

## 2023-04-03 DIAGNOSIS — I2510 Atherosclerotic heart disease of native coronary artery without angina pectoris: Secondary | ICD-10-CM | POA: Diagnosis present

## 2023-04-03 DIAGNOSIS — E782 Mixed hyperlipidemia: Secondary | ICD-10-CM | POA: Insufficient documentation

## 2023-04-03 NOTE — Patient Instructions (Signed)

## 2023-04-03 NOTE — Progress Notes (Signed)
Clinical Summary Mr. Mazzarella is a 77 y.o.male seen today for follow up of the following medical problems.   1. Sinus bradycardia, 1st degree AV block - we previously had stopped his beta blocker, HRs normalized at that time    07/2021 monitor: rare ectopy, 6 short runs SVT longest 5 beats, min HR 49, Max HR 176, avg HR 71 - no recent syncope, presyncope. Nonspecific days of generalized fatigue of unclear origin.       - no recent symptoms.   2. 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  - hghest exertion is walking dogs, few hundred yards.  - nonspecific chest discomfort right chest or left chest. Spasm like feeling, just a second or two - no cough, mild wheezing. Former cigar smoker - has had some recent LE edema.      07/2021 echo: LVEF 60-65%, no WMAs, normal diastolic, normal RV - we had considered PFTs but patient reported symptoms had improved.  - breathing is improving since last visit  - no recent SOB/DOE      3. Coronary Atherosclerosis - noted on CT scan 12/06/2021 - no chest pains.    3. OSA  uses CPAP and is compliant  - followed by [facility]   4. Prostate cancer   5. Hyperlipidemia - recent labs with pcp   6. CKD - followed by Dr Wolfgang Phoenix  7. Leg pains - aching pain bilateral pain typically at rest,can last up to 3 hours or more.  Past Medical History:  Diagnosis Date   Arthritis    Carpal tunnel syndrome    Chronic kidney disease, stage 3a (HCC)    Diverticulitis    DM type 2 (diabetes mellitus, type 2) (HCC)    GERD (gastroesophageal reflux disease)    Gout    History of colon polyps    2015 polyp x 1 dysplastic and adenomatous pieces.   Hypercholesterolemia    Hypertension    Insomnia    Prostate cancer (HCC)    Prostate enlargement    Restless leg syndrome    Seasonal allergies    Skin cancer      Allergies  Allergen Reactions   Mirapex  [Pramipexole Dihydrochloride] Other (See Comments)    Other Other (See Comments)   Pramipexole Other (See Comments)   Ciprofloxacin Nausea Only    Lightheaded   Flagyl [Metronidazole] Other (See Comments)    Can't remember symptoms   Ibuprofen    Propranolol Diarrhea   Tramadol Nausea And Vomiting    Can't take any medication similar to Tramadol     Current Outpatient Medications  Medication Sig Dispense Refill   amLODipine (NORVASC) 10 MG tablet Take 10 mg by mouth daily.     aspirin EC 81 MG tablet Take 81 mg by mouth daily.     atorvastatin (LIPITOR) 20 MG tablet Take 20 mg by mouth daily.     BERBERINE CHLORIDE PO Take 600 mg by mouth 2 (two) times daily.     CHERRY PO Take 1,000 mg by mouth 2 (two) times daily.     Cholecalciferol 25 MCG (1000 UT) capsule Take 1,000 Units by mouth daily.     ezetimibe (ZETIA) 10 MG tablet Take 10 mg by mouth daily.     fenofibrate micronized (LOFIBRA) 134 MG capsule Take 134 mg by mouth daily.     Finerenone (KERENDIA) 10 MG TABS Take 10 mg by mouth every other day.  fluorouracil (EFUDEX) 5 % cream Apply 1 application  topically daily as needed (irritation).     Gabapentin Enacarbil (HORIZANT) 600 MG TBCR Take 600 mg by mouth in the morning and at bedtime.     glipiZIDE (GLUCOTROL) 10 MG tablet Take 10 mg by mouth daily before breakfast.      hydrALAZINE (APRESOLINE) 50 MG tablet Take 50 mg by mouth 2 (two) times daily.     HYDROcodone-acetaminophen (NORCO/VICODIN) 5-325 MG tablet Take 1 tablet by mouth every 6 (six) hours as needed for moderate pain.     JARDIANCE 25 MG TABS tablet Take 25 mg by mouth daily.  1   LANTUS SOLOSTAR 100 UNIT/ML Solostar Pen Inject 38 Units into the skin 2 (two) times daily.     NON FORMULARY Uses CPAP nightly     NOVOLOG FLEXPEN 100 UNIT/ML FlexPen Inject 8 Units into the skin 3 (three) times daily with meals.     Omega-3 Fatty Acids (FISH OIL) 1000 MG CAPS Take 1,000 mg by mouth in the morning and at bedtime.     ondansetron (ZOFRAN) 4 MG tablet Take 1 tablet (4  mg total) by mouth every 8 (eight) hours as needed for nausea or vomiting. 20 tablet 0   oxyCODONE (ROXICODONE) 5 MG immediate release tablet Take 1 tablet (5 mg total) by mouth every 4 (four) hours as needed for moderate pain (pain score 4-6). 30 tablet 0   pregabalin (LYRICA) 50 MG capsule Take 50 mg by mouth in the morning and at bedtime.     Probiotic Product (PHILLIPS COLON HEALTH) CAPS Take 1 capsule by mouth daily.     rOPINIRole (REQUIP) 1 MG tablet Take 1-2 mg by mouth See admin instructions. Take 1 mg at by mouth in the afternoon and take 2 mg at night     Turmeric 500 MG TABS Take 500 mg by mouth daily.     valsartan (DIOVAN) 40 MG tablet Take 40 mg by mouth daily.     No current facility-administered medications for this visit.     Past Surgical History:  Procedure Laterality Date   APPENDECTOMY     CARPAL TUNNEL RELEASE Left 08/12/2021   COLONOSCOPY  04/25/2013   Dr.Spainhour- polypectomy x 1 pieces dysplastic and adenomatous; Recc repeat in 2020 (5 years).   COLONOSCOPY N/A 12/05/2017   Procedure: COLONOSCOPY;  Surgeon: Corbin Ade, MD;  Location: [facility];  Service: Endoscopy;  Laterality: N/A;  11:15am   HERNIA REPAIR     inguinal x2   POLYPECTOMY  12/05/2017   Procedure: POLYPECTOMY;  Surgeon: Corbin Ade, MD;  Location: [facility];  Service: Endoscopy;;  colon    TONSILLECTOMY     TOTAL SHOULDER ARTHROPLASTY Right 02/17/2023   Procedure: TOTAL SHOULDER ARTHROPLASTY;  Surgeon: Yolonda Kida, MD;  Location: [facility];  Service: Orthopedics;  Laterality: Right;   TRIGGER FINGER RELEASE       Allergies  Allergen Reactions   Mirapex  [Pramipexole Dihydrochloride] Other (See Comments)   Other Other (See Comments)   Pramipexole Other (See Comments)   Ciprofloxacin Nausea Only    Lightheaded   Flagyl [Metronidazole] Other (See Comments)    Can't remember symptoms   Ibuprofen    Propranolol Diarrhea   Tramadol Nausea And Vomiting    Can't take  any medication similar to Tramadol      Family History  Problem Relation Age of Onset   Hypertension Mother    Heart failure Mother  Prostate cancer Father    Hypertension Child    Hypertension Child    Hypertension Child    High Cholesterol Child    High Cholesterol Child    High Cholesterol Child    Cancer Paternal Aunt        unknown type   Cancer Paternal Aunt        unknown type   Colon cancer Neg Hx      Social History Mr. Schaus reports that he quit smoking about 23 months ago. His smoking use included cigars. He started smoking about 54 years ago. He quit smokeless tobacco use about 43 years ago. Mr. Nighswonger reports that he does not currently use alcohol.   Review of Systems CONSTITUTIONAL: No weight loss, fever, chills, weakness or fatigue.  HEENT: Eyes: No visual loss, blurred vision, double vision or yellow sclerae.No hearing loss, sneezing, congestion, runny nose or sore throat.  SKIN: No rash or itching.  CARDIOVASCULAR: per hpi RESPIRATORY: No shortness of breath, cough or sputum.  GASTROINTESTINAL: No anorexia, nausea, vomiting or diarrhea. No abdominal pain or blood.  GENITOURINARY: No burning on urination, no polyuria NEUROLOGICAL: per hpi MUSCULOSKELETAL: No muscle, back pain, joint pain or stiffness.  LYMPHATICS: No enlarged nodes. No history of splenectomy.  PSYCHIATRIC: No history of depression or anxiety.  ENDOCRINOLOGIC: No reports of sweating, cold or heat intolerance. No polyuria or polydipsia.  Marland Kitchen   Physical Examination Today's Vitals   04/03/23 1359  BP: 138/70  Pulse: (!) 57  SpO2: 96%  Weight: 228 lb (103.4 kg)  Height: 6' (1.829 m)   Body mass index is 30.92 kg/m.  Gen: resting comfortably, no acute distress HEENT: no scleral icterus, pupils equal round and reactive, no palptable cervical adenopathy,  CV: RRR, no mrg, no jvd Resp: Clear to auscultation bilaterally GI: abdomen is soft, non-tender, non-distended, normal bowel  sounds, no hepatosplenomegaly MSK: extremities are warm, no edema.  Skin: warm, no rash Neuro:  no focal deficits Psych: appropriate affect   Diagnostic Studies  07/2021 echo 1. Left ventricular ejection fraction, by estimation, is 60 to 65%. The  left ventricle has normal function. The left ventricle has no regional  wall motion abnormalities. There is mild left ventricular hypertrophy.  Left ventricular diastolic parameters  were normal.   2. Right ventricular systolic function is normal. The right ventricular  size is normal.   3. Left atrial size was mildly dilated.   4. The mitral valve is normal in structure. No evidence of mitral valve  regurgitation. No evidence of mitral stenosis.   5. Very mild stenosis mean gradient 5 mmhg normal DVI 0.77 AVA 1.9 cm2.  The aortic valve is tricuspid. There is moderate calcification of the  aortic valve. There is moderate thickening of the aortic valve. Aortic  valve regurgitation is not visualized.  Mild aortic valve stenosis.   6. The inferior vena cava is normal in size with greater than 50%  respiratory variability, suggesting right atrial pressure of 3 mmHg.      07/2021 monitor 7 day monitor Rare supraventricular ectopy in the form of isolated PACs, couplets, triplets. 6 runs of SVT longest 5 beats. Rare ventricualr ectopy in the form of isolated PVCs, couplets, triplets Reported symptoms correlate with sinus rhythm.   Assessment and Plan   1. DOE -no recent symptoms, prior echo was benign - continue to monitor.    2. Coronary atherosclerosis - noted on CT scan - no symptoms - continue ASA/statin and risk factor modification  3. HTN - essentially at goal, continue current meds  4. HLD - LDL at goal. Discussed dietary changes to lower TGs, pcp working on better glycemic control which will also help F/u 1 year     Antoine Poche, M.D.

## 2023-04-16 ENCOUNTER — Other Ambulatory Visit: Payer: Self-pay

## 2023-04-16 ENCOUNTER — Encounter (HOSPITAL_COMMUNITY): Payer: Self-pay

## 2023-04-16 ENCOUNTER — Emergency Department (HOSPITAL_COMMUNITY)
Admission: EM | Admit: 2023-04-16 | Discharge: 2023-04-16 | Disposition: A | Discharge status: Home / Self Care | Payer: Medicare Other | Attending: Emergency Medicine | Admitting: Emergency Medicine

## 2023-04-16 DIAGNOSIS — Z794 Long term (current) use of insulin: Secondary | ICD-10-CM | POA: Diagnosis not present

## 2023-04-16 DIAGNOSIS — T447X1A Poisoning by beta-adrenoreceptor antagonists, accidental (unintentional), initial encounter: Secondary | ICD-10-CM | POA: Diagnosis not present

## 2023-04-16 DIAGNOSIS — T481X1A Poisoning by skeletal muscle relaxants [neuromuscular blocking agents], accidental (unintentional), initial encounter: Secondary | ICD-10-CM | POA: Diagnosis not present

## 2023-04-16 DIAGNOSIS — T50901A Poisoning by unspecified drugs, medicaments and biological substances, accidental (unintentional), initial encounter: Secondary | ICD-10-CM

## 2023-04-16 DIAGNOSIS — Z7982 Long term (current) use of aspirin: Secondary | ICD-10-CM | POA: Diagnosis not present

## 2023-04-16 DIAGNOSIS — T426X1A Poisoning by other antiepileptic and sedative-hypnotic drugs, accidental (unintentional), initial encounter: Secondary | ICD-10-CM | POA: Insufficient documentation

## 2023-04-16 NOTE — Discharge Instructions (Signed)
You are cleared for discharge You may be mildly light headed tonight Check your blood pressure if you are light headed, if it is low, come back.

## 2023-04-16 NOTE — ED Provider Notes (Signed)
[facility] EMERGENCY DEPARTMENT AT [facility] Provider Note   CSN: [id number] Arrival date & time: 04/16/23  2115     History  Chief Complaint  Patient presents with   Drug Overdose    Glen Guerrero. is a 77 y.o. male.   Drug Overdose   This patient is a 77 year old male, presents after taking his wife's medications in addition to his.  The patient accidentally took his wife's medications but has no symptoms, this occurred at 8:00 PM, they called the pharmacist who recommended that he come to the emergency department, they did not call the [facility].  He has no nausea vomiting diarrhea abdominal pain chest pain shortness of breath dizziness palpitations headache blurred vision or any other symptoms.  Per the triage note he took a dose of lamotrigine, baclofen, carvedilol, and gabapentin.    Home Medications Prior to Admission medications   Medication Sig Start Date End Date Taking? Authorizing Provider  amLODipine (NORVASC) 10 MG tablet Take 10 mg by mouth daily.    [provider]  aspirin EC 81 MG tablet Take 81 mg by mouth daily.    [provider]  atorvastatin (LIPITOR) 20 MG tablet Take 20 mg by mouth daily.    [provider]  BERBERINE CHLORIDE PO Take 600 mg by mouth 2 (two) times daily. 11/29/21   [provider]  CHERRY PO Take 1,000 mg by mouth 2 (two) times daily.    [provider]  Cholecalciferol 25 MCG (1000 UT) capsule Take 1,000 Units by mouth daily. 07/02/20   [provider]  ezetimibe (ZETIA) 10 MG tablet Take 10 mg by mouth daily.    [provider]  fenofibrate micronized (LOFIBRA) 134 MG capsule Take 134 mg by mouth daily. 01/29/19   [provider]  Finerenone (KERENDIA) 10 MG TABS Take 10 mg by mouth every other day.    [provider]  fluorouracil (EFUDEX) 5 % cream Apply 1 application  topically daily as needed (irritation).    [provider]  Gabapentin Enacarbil (HORIZANT) 600 MG TBCR Take 600 mg by mouth in the morning and at bedtime. 01/26/21   [provider]  glipiZIDE (GLUCOTROL) 10 MG tablet Take 10 mg by mouth daily before breakfast.     [provider]  hydrALAZINE (APRESOLINE) 50 MG tablet Take 50 mg by mouth 2 (two) times daily. 05/14/21 04/03/23  [provider]  HYDROcodone-acetaminophen (NORCO/VICODIN) 5-325 MG tablet Take 1 tablet by mouth every 6 (six) hours as needed for moderate pain.    [provider]  JARDIANCE 25 MG TABS tablet Take 25 mg by mouth daily. 08/19/17   [provider]  LANTUS SOLOSTAR 100 UNIT/ML Solostar Pen Inject 38 Units into the skin 2 (two) times daily.    [provider]  NON FORMULARY Uses CPAP nightly    [provider]  NOVOLOG FLEXPEN 100 UNIT/ML FlexPen Inject 8 Units into the skin 3 (three) times daily with meals. 08/02/21   [provider]  Omega-3 Fatty Acids (FISH OIL) 1000 MG CAPS Take 1,000 mg by mouth in the morning and at bedtime. Patient not taking: Reported on 04/03/2023    [provider]  ondansetron (ZOFRAN) 4 MG tablet Take 1 tablet (4 mg total) by mouth every 8 (eight) hours as needed for nausea or vomiting. 02/17/23   Yolonda Kida, MD  oxyCODONE (ROXICODONE) 5 MG immediate release tablet Take 1 tablet (5  mg total) by mouth every 4 (four) hours as needed for moderate pain (pain score 4-6). 02/17/23 02/17/24  Yolonda Kida, MD  pregabalin (LYRICA) 50 MG capsule Take 50 mg by mouth in the morning and at bedtime. 08/02/21   [provider]  Probiotic Product (PHILLIPS COLON HEALTH) CAPS Take 1 capsule by mouth daily.    [provider]  rOPINIRole (REQUIP) 1 MG tablet Take 1-2 mg by mouth See admin instructions. Take 1 mg at by mouth in the afternoon and take 2 mg at night    [provider]  Turmeric 500 MG TABS Take 500 mg by mouth daily.     [provider]  valsartan (DIOVAN) 40 MG tablet Take 40 mg by mouth daily. 07/21/21   [provider]      Allergies    Mirapex  [pramipexole dihydrochloride], Other, Pramipexole, Ciprofloxacin, Flagyl [metronidazole], Ibuprofen, Propranolol, and Tramadol    Review of Systems   Review of Systems  All other systems reviewed and are negative.   Physical Exam Updated Vital Signs BP 118/67   Pulse 70   Temp 98.9 F (37.2 C) (Oral)   Resp 17   Ht 1.829 m (6')   Wt 103.4 kg   SpO2 94%   BMI 30.92 kg/m  Physical Exam Vitals and nursing note reviewed.  Constitutional:      General: He is not in acute distress.    Appearance: He is well-developed.  HENT:     Head: Normocephalic and atraumatic.     Nose: Nose normal.     Mouth/Throat:     Mouth: Mucous membranes are moist.     Pharynx: No oropharyngeal exudate.  Eyes:     General: No scleral icterus.       Right eye: No discharge.        Left eye: No discharge.     Conjunctiva/sclera: Conjunctivae normal.     Pupils: Pupils are equal, round, and reactive to light.  Neck:     Thyroid: No thyromegaly.     Vascular: No JVD.  Cardiovascular:     Rate and Rhythm: Normal rate and regular rhythm.     Heart sounds: Normal heart sounds. No murmur heard.    No friction rub. No gallop.  Pulmonary:     Effort: Pulmonary effort is normal. No respiratory distress.     Breath sounds: Normal breath sounds. No wheezing or rales.  Abdominal:     General: Bowel sounds are normal. There is no distension.     Palpations: Abdomen is soft. There is no mass.     Tenderness: There is no abdominal tenderness.  Musculoskeletal:        General: No tenderness. Normal range of motion.     Cervical back: Normal range of motion and neck supple.     Right lower leg: No edema.     Left lower leg: No edema.  Lymphadenopathy:     Cervical: No cervical adenopathy.  Skin:    General: Skin is warm and dry.     Findings: No erythema  or rash.  Neurological:     Mental Status: He is alert.     Coordination: Coordination normal.     Comments: Awake and alert - follows command - normal gait, normal speech and coordination  Psychiatric:        Behavior: Behavior normal.     ED Results / Procedures / Treatments   Labs (all labs ordered are listed, but  only abnormal results are displayed) Labs Reviewed - No data to display  EKG EKG Interpretation Date/Time:  Sunday April 16 2023 21:25:14 EST Ventricular Rate:  73 PR Interval:  240 QRS Duration:  100 QT Interval:  392 QTC Calculation: 431 R Axis:   37  Text Interpretation: Sinus rhythm with 1st degree A-V block Low voltage QRS Cannot rule out Anteroseptal infarct (cited on or before 06-Feb-2023) Abnormal ECG When compared with ECG of 06-Feb-2023 09:42, PR interval has increased Questionable change in QRS axis Confirmed by Eber Hong ([id number]) on 04/16/2023 9:28:13 PM  Radiology No results found.  Procedures Procedures    Medications Ordered in ED Medications - No data to display  ED Course/ Medical Decision Making/ A&P                                 Medical Decision Making  D/w Poison control - they recommend that he can probably stay at home - no need for evaluation - can follow BP for the next hour, and d/c home.  They do not think he needs any extensive monitoring, EKG or blood work.  As long as he stays asymptomatic  Patient is remain normal tensive for the last hour, he is stable for discharge,        Final Clinical Impression(s) / ED Diagnoses Final diagnoses:  Accidental drug ingestion, initial encounter    Rx / DC Orders ED Discharge Orders     None         Eber Hong, MD 04/16/23 2233

## 2023-04-16 NOTE — ED Triage Notes (Signed)
Pt states he accidentally took his wife's scheduled night time medications along with his normal medications at 2000 tonight  His medications: Aspirin 81mg  Ropinirole 1mg  Hydralazine 50mg  Turmeric 500mg   Wife's medications: Lamotrigine 150mg  Aspirin 81mg  Baclofen 10mg  Gabapentin 400mg  Isosorbide  Carvedilol 12.5mg    Pt has no complaints at this time. Daughter states she spoke to a pharmacist that recommended pt come to ER for evaluation.

## 2023-05-08 NOTE — Progress Notes (Signed)
 Assessment/Plan:   1.  Restless leg             -Patient has been on long-term ropinirole , 1 mg twice per day.  Discussed augmentation associated with the dopamine agonist medications.  He did not remember this discussion that we had last time.  He has not changed dosage, but symptoms are moving into the daytime, and certainly this could be from ropinirole .  He is also having daytime symptoms where he feels like he just hits a wall, but not quite sleep attacks, and he just needs to go to bed.  Nonetheless, I think that his ropinirole  needs to be decreased.  Generally, when patients have augmentation from ropinirole , it needs to be decreased very slowly, or patients get extreme rebound with restless leg.  Because patient is already having significant restless leg symptoms, my recommendation would be to decrease ropinirole  very slow, perhaps over 3 to 6 months even, and just target the daytime dose first.  While doing that, perhaps the Lyrica  dose can be increased. 2.  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  Discussed with patient that there is no medication for gait instability associated with peripheral neuropathy.  The only real treatment for this is physical therapy and ambulatory assistive devices.  -pt on lyrica  50 mg bid.  He probably shouldn't be on lyrica  and horizant together, as these are really very similar drugs.  I told the patient that I am really going to provide suggestions today, but he has too many providers dealing with the same medical issues.  He is ultimately going to go back to his primary care physician after I provide suggestions and see if he can get his primary care physician to help him simplify his medication regimen.  I think I would recommend getting rid of the Horizant slowly, and ultimately using Lyrica  alone, both for neuropathy as well as for restless leg.  After a long discussion, we both  agree that the neuropathy symptoms are really quite minimal, but the restless leg symptoms are most bothersome.  I am hopeful that the primary care physician can work with his sleep PA/NP in [place] to figure out his medication regimen, or perhaps his primary care physician can take over this so he does not have so many providers dealing with the same issue.  -pt would still have options for cymbalta in future as well as compounded creams, if PN increases             -last A1C 7.6  3.  Fasciculations             -EMG was unremarkable, with the exception of a superimposed chronic right L4 radiculopathy.  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 was seen today in the neurology clinic.  I have not seen him in a year and a half.  When I saw him back then, he was complaining about tremor, but we saw very little tremor on examination.  After some discussion, we both agreed that he did not need further medication.  We did talk about his restless legs and I felt he was getting augmentation from the ropinirole  that he was on.  He was on 1 mg twice per day, but it was apparently seeing a sleep specialist at [place].  We also discussed his peripheral neuropathy, due  to diabetes.  His A1c at the time was 7.7 (currently 7.6).  His biggest bothersome complaint at that point in time was gait instability and we discussed that the only treatment for that aspect was physical therapy/ambulatory assistive devices.  He was already taking Horizant and still is, being RX by the NP at the pulm/sleep clinic in [place].  He did have an EMG done September, 2023 that demonstrated/confirmed large fiber axonal peripheral neuropathy and chronic L4 radiculopathy on the right.  Pt is also on lyrica  50 mg bid by Dr. Shona.  He reports today that my legs give me trouble.  He has restlessness and jerking in the legs at night when he lays down.  That feeling has moved into the daytime as  well, when he sits and drives.  He does well when he walks.  When he puts his feet up in the evening, he does have tingling in the feet.    He does have a back stimulator now in place and its helping his pain.  He had a shoulder surgery about 12 weeks ago and is still recovering from that.     PREVIOUS MEDICATIONS: pramipexole (reported as an allergy, but the allergy was insomnia); propranolol (reports diarrhea)  CURRENT MEDICATIONS:  Outpatient Encounter Medications as of 05/09/2023  Medication Sig   amLODipine  (NORVASC ) 10 MG tablet Take 10 mg by mouth daily.   aspirin  EC 81 MG tablet Take 81 mg by mouth daily.   atorvastatin  (LIPITOR) 20 MG tablet Take 20 mg by mouth daily.   BERBERINE CHLORIDE PO Take 600 mg by mouth 2 (two) times daily.   CHERRY PO Take 1,000 mg by mouth 2 (two) times daily.   Cholecalciferol 25 MCG (1000 UT) capsule Take 1,000 Units by mouth daily.   ezetimibe  (ZETIA ) 10 MG tablet Take 10 mg by mouth daily.   fenofibrate micronized (LOFIBRA) 134 MG capsule Take 134 mg by mouth daily.   Finerenone (KERENDIA) 10 MG TABS Take 10 mg by mouth every other day.   fluorouracil (EFUDEX) 5 % cream Apply 1 application  topically daily as needed (irritation).   Gabapentin Enacarbil (HORIZANT) 600 MG TBCR Take 600 mg by mouth in the morning and at bedtime.   glipiZIDE  (GLUCOTROL ) 10 MG tablet Take 10 mg by mouth daily before breakfast.    LANTUS SOLOSTAR 100 UNIT/ML Solostar Pen Inject 38 Units into the skin 2 (two) times daily.   NON FORMULARY Uses CPAP nightly   NOVOLOG  FLEXPEN 100 UNIT/ML FlexPen Inject 8 Units into the skin 3 (three) times daily with meals.   pregabalin  (LYRICA ) 50 MG capsule Take 50 mg by mouth in the morning and at bedtime.   Probiotic Product (PHILLIPS COLON HEALTH) CAPS Take 1 capsule by mouth daily.   rOPINIRole  (REQUIP ) 1 MG tablet Take 1-2 mg by mouth See admin instructions. Take 1 mg at by mouth in the afternoon and take 2 mg at night   Turmeric 500  MG TABS Take 500 mg by mouth daily.   valsartan (DIOVAN) 40 MG tablet Take 40 mg by mouth daily.   hydrALAZINE  (APRESOLINE ) 50 MG tablet Take 50 mg by mouth 2 (two) times daily.   JARDIANCE  25 MG TABS tablet Take 25 mg by mouth daily.   Omega-3 Fatty Acids (FISH OIL) 1000 MG CAPS Take 1,000 mg by mouth in the morning and at bedtime. (Patient not taking: Reported on 04/03/2023)   [DISCONTINUED] HYDROcodone -acetaminophen  (NORCO/VICODIN) 5-325 MG tablet Take 1 tablet by mouth every  6 (six) hours as needed for moderate pain.   [DISCONTINUED] ondansetron  (ZOFRAN ) 4 MG tablet Take 1 tablet (4 mg total) by mouth every 8 (eight) hours as needed for nausea or vomiting.   [DISCONTINUED] oxyCODONE  (ROXICODONE ) 5 MG immediate release tablet Take 1 tablet (5 mg total) by mouth every 4 (four) hours as needed for moderate pain (pain score 4-6).   No facility-administered encounter medications on file as of 05/09/2023.     Objective:   PHYSICAL EXAMINATION:    VITALS:   Vitals:   05/09/23 1500  BP: 126/82  Pulse: 63  SpO2: 98%  Weight: 224 lb 12.8 oz (102 kg)     GEN:  The patient appears stated age and is in NAD. HEENT:  Normocephalic, atraumatic.  The mucous membranes are moist. The superficial temporal arteries are without ropiness or tenderness. CV:  RRR Lungs:  CTAB Neck/HEME:  There are no carotid bruits bilaterally.   Neurological examination:   Orientation: The patient is alert and oriented x3.  Cranial nerves: There is good facial symmetry.  Extraocular muscles are intact. The visual fields are full to confrontational testing. The speech is fluent and clear. Soft palate rises symmetrically and there is no tongue deviation. Hearing is intact to conversational tone. Sensation: Sensation is intact to light touch throughout (facial, trunk, extremities). Vibration is decreased distally. There is no extinction with double simultaneous stimulation.  Motor: Strength is 5/5 in the bilateral upper  and lower extremities.   Shoulder shrug is equal and symmetric.  There is no pronator drift.    Movement examination: Tone: There is normal tone in the bilateral upper extremities.  The tone in the lower extremities is normal.  Abnormal movements: None today  Coordination:  There is no decremation with RAM's, with any form of RAMS, including alternating supination and pronation of the forearm, hand opening and closing, finger taps, heel taps and toe taps.   Total time spent on today's visit was 55 minutes, including both face-to-face time and nonface-to-face time.  Time included that spent on review of records (prior notes available to me/labs/imaging if pertinent), discussing treatment and goals, answering patient's questions and coordinating care.  Cc:  Shona Norleen PEDLAR, MD

## 2023-05-09 ENCOUNTER — Ambulatory Visit (INDEPENDENT_AMBULATORY_CARE_PROVIDER_SITE_OTHER): Payer: Medicare Other | Admitting: Neurology

## 2023-05-09 ENCOUNTER — Encounter: Payer: Self-pay | Admitting: Neurology

## 2023-05-09 VITALS — BP 126/82 | HR 63 | Wt 224.8 lb

## 2023-05-09 DIAGNOSIS — G2581 Restless legs syndrome: Secondary | ICD-10-CM

## 2023-05-09 DIAGNOSIS — E1142 Type 2 diabetes mellitus with diabetic polyneuropathy: Secondary | ICD-10-CM

## 2023-05-09 NOTE — Patient Instructions (Signed)
 Good to see you.  We discussed that Horizant and Lyrica  are similar and one of them (likely the horizant) needs titrated off and I will provide some recommendations to your other providers.  Also, your requip /ropinirole  is likely causing some augmentation and worsening restless leg so will need to be slowly tapered.  It was good to see you!  The physicians and staff at [facility] are committed to providing excellent care. You may receive a survey requesting feedback about your experience at our office. We strive to receive very good responses to the survey questions. If you feel that your experience would prevent you from giving the office a very good  response, please contact our office to try to remedy the situation. We may be reached at [phone]. Thank you for taking the time out of your busy day to complete the survey.

## 2023-08-31 ENCOUNTER — Ambulatory Visit: Payer: Medicare Other | Admitting: Urology

## 2023-09-26 ENCOUNTER — Ambulatory Visit: Payer: Self-pay

## 2023-09-26 LAB — TESTOSTERONE: Testosterone: 160 ng/dL — ABNORMAL LOW (ref 264–916)

## 2023-09-26 LAB — PSA: Prostate Specific Ag, Serum: 0.2 ng/mL (ref 0.0–4.0)

## 2023-09-28 ENCOUNTER — Ambulatory Visit: Payer: Medicare Other | Admitting: Urology

## 2023-09-28 VITALS — BP 118/68 | HR 57

## 2023-09-28 DIAGNOSIS — E291 Testicular hypofunction: Secondary | ICD-10-CM | POA: Diagnosis not present

## 2023-09-28 DIAGNOSIS — N401 Enlarged prostate with lower urinary tract symptoms: Secondary | ICD-10-CM

## 2023-09-28 DIAGNOSIS — N5201 Erectile dysfunction due to arterial insufficiency: Secondary | ICD-10-CM

## 2023-09-28 DIAGNOSIS — N138 Other obstructive and reflux uropathy: Secondary | ICD-10-CM

## 2023-09-28 DIAGNOSIS — R351 Nocturia: Secondary | ICD-10-CM | POA: Diagnosis not present

## 2023-09-28 DIAGNOSIS — Z8546 Personal history of malignant neoplasm of prostate: Secondary | ICD-10-CM

## 2023-09-28 LAB — URINALYSIS, ROUTINE W REFLEX MICROSCOPIC
Bilirubin, UA: NEGATIVE
Ketones, UA: NEGATIVE
Leukocytes,UA: NEGATIVE
Nitrite, UA: NEGATIVE
RBC, UA: NEGATIVE
Specific Gravity, UA: 1.01 (ref 1.005–1.030)
Urobilinogen, Ur: 0.2 mg/dL (ref 0.2–1.0)
pH, UA: 6 (ref 5.0–7.5)

## 2023-09-28 NOTE — Progress Notes (Unsigned)
 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 History of prostate cancer   3. BPH with urinary obstruction   4. Nocturia   5. Erectile dysfunction due to arterial insufficiency     I have prostate cancer. HPI: Glen Guerrero is a 78 year-old male established patient who is here evaluation for treatment of prostate cancer.  His prostate cancer was diagnosed 01/04/2019. He does have the pathology report from his biopsy. His PSA at his time of diagnosis was 3.7.   09/28/23: Glen Guerrero returns today in f/u for his history of T2a GG3 prostate cancer treated with ST-ADT and EXRT that he completed in 2/21.  His PSA is stable at 0.2.  His T is minimally increased at 160.  His iPSS is 6 with some intermittency.   He has no weight loss or bone pain.  He has no GI complaints.  He has had no hematuria and his UA is clear today.  He has no hot flashes.    03/09/23: Glen Guerrero returns  today in f/u.  His PSA is stable at 0.2 on 03/06/23 but his T was down to 117.  He had back surgery and then 3 weeks later a right shoulder replacement.  He has been on hydrocodone  and more recently oxycodone  since.  He is voiding well with an IPSS of 4 and nocturia x 2.  He has had no hematuria or GI bleed.  He has no weight loss or bone pain.  He has persistent ED but failed oral therapy.    08/25/22: Glen Guerrero returns today in f/u. HIs PSA is stable at 0.2 with a further increase in the T at 151.   He has stable LUTS with an IPSS of 9.  He has nocturia x 1 but intermittency prior to bed.  He has a reduced stream.  He has 3+ glucose on Jardiance .  He has no GI complaints.  He is gaining weight and is up 5-6 lbs.  He has no bone pain.   02/24/22: Glen Guerrero returns today in f/u.  He last had Eligard  30mg  on 08/09/19.  He completed EXRT in 2/21.  He had T2a GG3 prostate cancer on his pretreatment biopsy.   He had an episode of retention in September from constipation but is voiding well now with an IPSS of 7 and a PVR of 25ml.  He had labs on 02/18/22 and  his T is up to 155 and his PSA remains low at 0.2.  He had some hematuria with a UTI after the foley was removed and he had 3 rounds of antibiotics with the last dose last week. He is having some weight gain.  He has chronic back pain but no bone pain.  He is no GI issues today.   08/26/21: Glen Guerrero returns today in f/u for the history below.  His PSA is stable at 0.2.  His T is up slightly to 104.  He is voiding ok with an IPSS of 8 and nocturia x 2.  He has no hot flashes.  His weight is up some.  He has good energy and strength particularly over the last month.  He has persistent ED.  He has had no hematuria.   He has no GI complaints.   He no bone pain.    02/25/21: Glen Guerrero returns for the history noted below.  His PSA on 02/05/21 was 0.2 which is up from <0.1 in 4/22.  The testosterone  remains low at 85.  He last had Eligard  30mg  on  08/09/19.  He completed EXRT in 2/21.  He had T2a GG3 prostate cancer on his pretreatment biopsy.  He is voiding wel but has nocturia x 2 and some intermittency.  His IPSS is 5.  He is taking ambien for sleep issues related to restless leg.  He has lost a couple of lbs.  He has no GI complaints but he had a SBO 5 weeks ago that resolved with conservative management.  He has ED and didn't have a good response to PDE5's.   He had headaches with it.   He is not having hot flashes but he has malaise in the afternoon and has chronic myalgias.    He has been seen at the [facility] for his T2a Gleason 7(4+3) prostate cancer. He has elected to proceed with EXRT with adjuvanct ADT for 6-8 months. He will be getting his EXRT at [facility] and I will await their input regarding the need for [employer] and fiducials. He was initially seen for BPH with BOO and was found to have an 8mm right base nodule with a PSA of 3.7 which was from 10/19 prior to the initiation of finasteride which he is currently taking. He had a biopsy that demonstrated a 32ml prostate with a T2a N0 M0 Gleason 7(4+3)  prostate cancer. The right base and mid lateral cores had Gleason 7(4+3) up to 90% involvement. The remaining 4 right cores had 7(3+4) disease up to 85% involvement. 3/6 left cores had low volume Gleason 6 disease. The bone scan is negative and a CT showed some small subpleural nodules favored to be nodes but no other evidence of metastatic disease. His comorbidities include HTN, hyperlipidemia, and diabetes. His voiding symptoms have improved since his initial visit.   [facility] nomogram predicts. 28% OCD, 69% ECE, 19% LNI and 20% SVI.          ROS:  ROS:  Review of Systems  Constitutional:  Positive for malaise/fatigue.       Night sweats for several months to over a year.   Musculoskeletal:  Positive for back pain and joint pain.  Endo/Heme/Allergies:  Bruises/bleeds easily.  All other systems reviewed and are negative.       Allergies  Allergen Reactions   Mirapex  [Pramipexole Dihydrochloride] Other (See Comments)   Other Other (See Comments)   Pramipexole Other (See Comments)   Ciprofloxacin Nausea Only    Lightheaded   Flagyl [Metronidazole] Other (See Comments)    Can't remember symptoms   Ibuprofen    Propranolol Diarrhea   Tramadol Nausea And Vomiting    Can't take any medication similar to Tramadol    Outpatient Encounter Medications as of 09/28/2023  Medication Sig   amLODipine  (NORVASC ) 10 MG tablet Take 10 mg by mouth daily.   aspirin  EC 81 MG tablet Take 81 mg by mouth daily.   atorvastatin  (LIPITOR) 20 MG tablet Take 20 mg by mouth daily.   BERBERINE CHLORIDE PO Take 600 mg by mouth 2 (two) times daily.   CHERRY PO Take 1,000 mg by mouth 2 (two) times daily.   ezetimibe  (ZETIA ) 10 MG tablet Take 10 mg by mouth daily.   fenofibrate micronized (LOFIBRA) 134 MG capsule Take 134 mg by mouth daily.   Finerenone (KERENDIA) 10 MG TABS Take 10 mg by mouth every other day.   fluorouracil (EFUDEX) 5 % cream Apply 1 application  topically daily as needed (irritation).    JARDIANCE  25 MG TABS tablet Take 25 mg by mouth daily.  LANTUS SOLOSTAR 100 UNIT/ML Solostar Pen Inject 38 Units into the skin 2 (two) times daily.   NON FORMULARY Uses CPAP nightly   NOVOLOG  FLEXPEN 100 UNIT/ML FlexPen Inject 8 Units into the skin 3 (three) times daily with meals.   Omega-3 Fatty Acids (FISH OIL) 1000 MG CAPS Take 1,000 mg by mouth in the morning and at bedtime.   pregabalin  (LYRICA ) 50 MG capsule Take 50 mg by mouth in the morning and at bedtime.   Probiotic Product (PHILLIPS COLON HEALTH) CAPS Take 1 capsule by mouth daily.   rOPINIRole  (REQUIP ) 1 MG tablet Take 1-2 mg by mouth See admin instructions. Take 1 mg at by mouth in the afternoon and take 2 mg at night   Turmeric 500 MG TABS Take 500 mg by mouth daily.   valsartan (DIOVAN) 40 MG tablet Take 40 mg by mouth daily.   hydrALAZINE  (APRESOLINE ) 50 MG tablet Take 50 mg by mouth 2 (two) times daily.   [DISCONTINUED] Cholecalciferol 25 MCG (1000 UT) capsule Take 1,000 Units by mouth daily. (Patient not taking: Reported on 09/28/2023)   [DISCONTINUED] Gabapentin Enacarbil (HORIZANT) 600 MG TBCR Take 600 mg by mouth in the morning and at bedtime. (Patient not taking: Reported on 09/28/2023)   [DISCONTINUED] glipiZIDE  (GLUCOTROL ) 10 MG tablet Take 10 mg by mouth daily before breakfast.  (Patient not taking: Reported on 09/28/2023)   No facility-administered encounter medications on file as of 09/28/2023.    Past Medical History:  Diagnosis Date   Arthritis    Carpal tunnel syndrome    Chronic kidney disease, stage 3a (HCC)    Diverticulitis    DM type 2 (diabetes mellitus, type 2) (HCC)    GERD (gastroesophageal reflux disease)    Gout    History of colon polyps    2015 polyp x 1 dysplastic and adenomatous pieces.   Hypercholesterolemia    Hypertension    Insomnia    Prostate cancer [facility])    Prostate enlargement    Restless leg syndrome    Seasonal allergies    Skin cancer     Past Surgical History:  Procedure  Laterality Date   APPENDECTOMY     CARPAL TUNNEL RELEASE Left 08/12/2021   COLONOSCOPY  04/25/2013   Dr.Spainhour- polypectomy x 1 pieces dysplastic and adenomatous; Recc repeat in 2020 (5 years).   COLONOSCOPY N/A 12/05/2017   Procedure: COLONOSCOPY;  Surgeon: Suzette Espy, MD;  Location: [facility];  Service: Endoscopy;  Laterality: N/A;  11:15am   HERNIA REPAIR     inguinal x2   Nalu Nuerostimulator  05/2022   POLYPECTOMY  12/05/2017   Procedure: POLYPECTOMY;  Surgeon: Suzette Espy, MD;  Location: [facility];  Service: Endoscopy;;  colon    TONSILLECTOMY     TOTAL SHOULDER ARTHROPLASTY Right 02/17/2023   Procedure: TOTAL SHOULDER ARTHROPLASTY;  Surgeon: Janeth Medicus, MD;  Location: [facility];  Service: Orthopedics;  Laterality: Right;   TRIGGER FINGER RELEASE      Social History   Socioeconomic History   Marital status: Married    Spouse name: Adell Hones   Number of children: 3   Years of education: Not on file   Highest education level: Not on file  Occupational History   Not on file  Tobacco Use   Smoking status: Former    Types: Cigars    Start date: 04/12/1969    Quit date: 04/25/2021    Years since quitting: 2.4   Smokeless tobacco: Former  Quit date: 05/20/1979   Tobacco comments:    5 cigars per week  Vaping Use   Vaping status: Never Used  Substance and Sexual Activity   Alcohol use: Not Currently   Drug use: Yes    Comment: has a cookie/brownie with marijuana in it nightly that helps him sleep   Sexual activity: Yes  Other Topics Concern   Not on file  Social History Narrative   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   Right handed.    Social Drivers of Corporate investment banker Strain: Low Risk  (07/10/2019)   Received from [facility], [facility]   Overall Financial Resource Strain (CARDIA)    Difficulty of Paying Living Expenses: Not hard at all  Food  Insecurity: No Food Insecurity (02/17/2023)   Hunger Vital Sign    Worried About Running Out of Food in the Last Year: Never true    Ran Out of Food in the Last Year: Never true  Transportation Needs: No Transportation Needs (02/17/2023)   PRAPARE - Administrator, Civil Service (Medical): No    Lack of Transportation (Non-Medical): No  Physical Activity: Unknown (07/10/2019)   Received from [facility], [facility]   Exercise Vital Sign    Days of Exercise per Week: Patient declined    Minutes of Exercise per Session: Patient declined  Stress: No Stress Concern Present (07/10/2019)   Received from [facility], [facility] of Occupational Health - Occupational Stress Questionnaire    Feeling of Stress : Only a little  Social Connections: Unknown (07/10/2019)   Received from [facility], [facility]   Social Connection and Isolation Panel [NHANES]    Frequency of Communication with Friends and Family: Patient declined    Frequency of Social Gatherings with Friends and Family: Patient declined    Attends Religious Services: Patient declined    Database administrator or Organizations: Patient declined    Attends Banker Meetings: Patient declined    Marital Status: Patient declined  Intimate Partner Violence: Not At Risk (02/17/2023)   Humiliation, Afraid, Rape, and Kick questionnaire    Fear of Current or Ex-Partner: No    Emotionally Abused: No    Physically Abused: No    Sexually Abused: No    Family History  Problem Relation Age of Onset   Hypertension Mother    Heart failure Mother    Prostate cancer Father    Hypertension Child    Hypertension Child    Hypertension Child    High Cholesterol Child    High Cholesterol Child    High Cholesterol Child    Cancer Paternal Aunt        unknown type   Cancer Paternal Aunt        unknown type   Colon cancer Neg Hx        Objective: Vitals:   09/28/23  1053  BP: 118/68  Pulse: (!) 57     Physical Exam Vitals reviewed.  Constitutional:      Appearance: Normal appearance.  Neurological:     Mental Status: He is alert.     Lab Results:  Results for orders placed or performed in visit on 09/28/23 (from the past 24 hours)  Urinalysis, Routine w reflex microscopic     Status: Abnormal   Collection Time:  09/28/23 10:57 AM  Result Value Ref Range   Specific Gravity, UA 1.010 1.005 - 1.030   pH, UA 6.0 5.0 - 7.5   Color, UA Yellow Yellow   Appearance Ur Clear Clear   Leukocytes,UA Negative Negative   Protein,UA Trace Negative/Trace   Glucose, UA 3+ (A) Negative   Ketones, UA Negative Negative   RBC, UA Negative Negative   Bilirubin, UA Negative Negative   Urobilinogen, Ur 0.2 0.2 - 1.0 mg/dL   Nitrite, UA Negative Negative   Microscopic Examination Comment    Narrative   Performed at:  [address] - [facility] [address], [place], [place]  [id number] Lab Director: Liliana Regulus MT, Phone:  [phone]      Lab Results  Component Value Date   PSA1 0.2 09/25/2023   PSA1 0.2 08/11/2022   PSA1 0.2 08/23/2021       BMET No results for input(s): "NA", "K", "CL", "CO2", "GLUCOSE", "BUN", "CREATININE", "CALCIUM " in the last 72 hours. PSA PSA  Date Value Ref Range Status  07/16/2019 <0.1 < OR = 4.0 ng/mL Final    Comment:    The total PSA value from this assay system is  standardized against the WHO standard. The test  result will be approximately 20% lower when compared  to the equimolar-standardized total PSA (Beckman  Coulter). Comparison of serial PSA results should be  interpreted with this fact in mind. . This test was performed using the Siemens  chemiluminescent method. Values obtained from  different assay methods cannot be used interchangeably. PSA levels, regardless of value, should not be interpreted as absolute evidence of the presence or absence of disease.    Testosterone   Date Value Ref  Range Status  09/25/2023 160 (L) 264 - 916 ng/dL Final    Comment:    Adult male reference interval is based on a population of healthy nonobese males (BMI <30) between 32 and 52 years old. Travison, et.al. JCEM [phone]. PMID: 38756433.   08/11/2022 151 (L) 264 - 916 ng/dL Final    Comment:    Adult male reference interval is based on a population of healthy nonobese males (BMI <30) between 57 and 52 years old. Travison, et.al. JCEM [phone]. PMID: 60109323.   08/23/2021 104 (L) 264 - 916 ng/dL Final    Comment:    Adult male reference interval is based on a population of healthy nonobese males (BMI <30) between 4 and 69 years old. Travison, et.al. JCEM [phone]. PMID: 37628315.     Lab Results  Component Value Date   PSA1 0.2 09/25/2023    08/25/22: : PSA 0.2 and T 151 03/06/23: PSA  0.2 and T 117.  UA is clear with 3+ glucose.   Studies/Results: .      Assessment & Plan: Prostate cancer.  His PSA is stable and low.  PSA in 6 months.   Hypogonadism.  His T is up slightly.   I discussed TRT and clomid.   I will repeat in 6 months.      BPH with BOO and Nocturia.  He is content with his voiding symptom on no therapy.   ED.  He failed oral therapy and may be interested in injection therapy.  I reviewed the risks and gave him information on Edex.  He will let me know if he wants to proceed with that.   .     No orders of the defined types were placed in this encounter.    Orders Placed This  Encounter  Procedures   Urinalysis, Routine w reflex microscopic   PSA    Standing Status:   Future    Expected Date:   03/29/2024    Expiration Date:   09/27/2024   Testosterone     Standing Status:   Future    Expected Date:   03/29/2024    Expiration Date:   09/27/2024   Ambulatory referral to Urology    Referral Priority:   Routine    Referral Type:   Consultation    Referral Reason:   Specialty Services Required    Referred to Provider:    Homero Luster, MD    Requested Specialty:   Urology    Number of Visits Requested:   1      Return in about 6 months (around 03/29/2024) for in [place].   CC: Omie Bickers, MD      Homero Luster 09/29/2023 Patient ID: Benson Brawn., male   DOB: [birth year], 78 y.o.   MRN: [id number]

## 2023-09-29 ENCOUNTER — Encounter: Payer: Self-pay | Admitting: Urology

## 2023-10-25 ENCOUNTER — Encounter: Payer: Self-pay | Admitting: Gastroenterology

## 2023-10-25 ENCOUNTER — Ambulatory Visit (INDEPENDENT_AMBULATORY_CARE_PROVIDER_SITE_OTHER): Admitting: Gastroenterology

## 2023-10-25 VITALS — BP 139/76 | HR 56 | Temp 98.6°F | Ht 72.0 in | Wt 224.0 lb

## 2023-10-25 DIAGNOSIS — Z860101 Personal history of adenomatous and serrated colon polyps: Secondary | ICD-10-CM | POA: Diagnosis not present

## 2023-10-25 DIAGNOSIS — Z860100 Personal history of colon polyps, unspecified: Secondary | ICD-10-CM

## 2023-10-25 DIAGNOSIS — K8681 Exocrine pancreatic insufficiency: Secondary | ICD-10-CM | POA: Diagnosis not present

## 2023-10-25 NOTE — Progress Notes (Signed)
 GI Office Note    Referring Provider: Shona Norleen PEDLAR, MD Primary Care Physician:  Glen Norleen PEDLAR, MD Primary Gastroenterologist: Glen Guerrero.Rourk, MD  Date:  10/25/2023  ID:  Glen FORBES Bridgette Mickey., DOB Jul 25, 1945, MRN [id number]  Chief Complaint   Chief Complaint  Patient presents with   Follow-up    Follow up to see about colonoscopy   History of Present Illness  Glen Sole. is a 78 y.o. male with a history of EPI, prostate cancer, HTN, HLD, GERD, type 2 diabetes, CKD stage IIIa, restless leg syndrome, and skin cancer presenting today to discuss need for follow-up colonoscopy.  Colonoscopy 11/2017: -hemorrhoids found on perianal exam -non-bleeding external and internal hemorrhoids -one 6mm polyp in descending colon, tubular adenoma -diverticulosis - Repeat in 7 years  He has a history of small bowel obstruction in 2022 while on vacation in [place], transition point in the right lower quadrant.  At that time was noted to have slightly elevated AST of 49 and lipase of 258. 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   Completed CT enterography Dec 2022 to further evaluate causes of small bowel obstruction.  No significant findings seen.  Obstruction felt to be related to adhesions.  Repeat lipase was mildly elevated at 98.   In June 2023, C. difficile and GI profile negative negative.  Fecal pancreatic elastase 145.  TSH and free T4 normal, celiac serologies negative, lipase normal 43.   In 11/2021, CT abdomen with and without contrast to evaluate pancreas, showed no pancreatic abnormalities. He had hepatic steatosis and descending colon diverticulosis.   Last office visit July 2024 with Glen Kerns, PA (follows him regularly).  Noted that bowel movements returned to normal prior to starting Zenpep , stopping propranolol likely helped his stools as well.  At his visit he was doing well, having solid stools once per day.  Denied  any abdominal pain, melena, BRBPR, or weight loss.  Reportedly worried about his pancreas given sister-in-law was diagnosed with pancreatic cancer and she had similar issues to what he has been experiencing. Neurostimulator placed in February 2024 - not candidate for MRI.  Pancreatic elastase ordered.  It was discussed with him at the time that there was no indication for pancreatic cancer screening given he has no documented family history or abnormal genetic variants.  Reassurance was given to him regarding previous unremarkable workup.  Repeat fecal elastase in 11/2022 was normal. Had improved from 145 initially to 538.   Labs January 2025: Hemoglobin A1c 7.6.  Lipid panel with elevated triglycerides at 312, LDL 37.  Creatinine 1.63, GFR 44, BUN 32.  Normal AST, ALT, AP, and T. bili. Hgb 16.6, platelets 226.   New referral sent by PCP office for colonoscopy given history of colon polyps.  This note states there was recommended 3-5-year repeat however our records and documentation by Dr. Shaaron in 2019 states a 7-year follow-up colonoscopy.   Today:  Still having some tremor at times but overall improved from the past- follows with neurology. Thinks combo of lyrica  and propanol caused the diarrhea given at the time his dose of lyrica  was increased.   Reports daily BM without issue.  No diarrhea or overt constipation.  Denies any abdominal pain, weight loss, early satiety, lack of appetite, chest pain, shortness of breath, syncope, presyncope, nausea, vomiting, dysphagia, melena, BRBPR.  Has had more weight gain as of late he reports although per our  records today's weight has been stable from 219-228 within the last 9 months.  Rare nocturnal GERD but not frequent enough to take anything and likely secondary to dietary choices.  Underwent total shoulder replacement in October 2024.   Wt Readings from Last 5 Encounters:  10/25/23 224 lb (101.6 kg)  05/09/23 224 lb 12.8 oz (102 kg)  04/16/23 228 lb  (103.4 kg)  04/03/23 228 lb (103.4 kg)  02/17/23 219 lb (99.3 kg)    Current Outpatient Medications  Medication Sig Dispense Refill   amLODipine  (NORVASC ) 10 MG tablet Take 10 mg by mouth daily.     aspirin  EC 81 MG tablet Take 81 mg by mouth daily.     atorvastatin  (LIPITOR) 20 MG tablet Take 20 mg by mouth daily.     BERBERINE CHLORIDE PO Take 600 mg by mouth 2 (two) times daily.     CHERRY PO Take 1,000 mg by mouth 2 (two) times daily.     ezetimibe  (ZETIA ) 10 MG tablet Take 10 mg by mouth daily.     fenofibrate micronized (LOFIBRA) 134 MG capsule Take 134 mg by mouth daily.     Finerenone (KERENDIA) 10 MG TABS Take 10 mg by mouth every other day.     fluorouracil (EFUDEX) 5 % cream Apply 1 application  topically daily as needed (irritation).     hydrALAZINE  (APRESOLINE ) 50 MG tablet Take 50 mg by mouth 2 (two) times daily.     JARDIANCE  25 MG TABS tablet Take 25 mg by mouth daily.  1   LANTUS SOLOSTAR 100 UNIT/ML Solostar Pen Inject 38 Units into the skin 2 (two) times daily. (Patient taking differently: Inject 41 Units into the skin 2 (two) times daily.)     NOVOLOG  FLEXPEN 100 UNIT/ML FlexPen Inject 8 Units into the skin 3 (three) times daily with meals.     pregabalin  (LYRICA ) 50 MG capsule Take 50 mg by mouth in the morning and at bedtime.     Probiotic Product (PHILLIPS COLON HEALTH) CAPS Take 1 capsule by mouth daily.     valsartan (DIOVAN) 40 MG tablet Take 40 mg by mouth daily.     NON FORMULARY Uses CPAP nightly     Omega-3 Fatty Acids (FISH OIL) 1000 MG CAPS Take 1,000 mg by mouth in the morning and at bedtime. (Patient not taking: Reported on 10/25/2023)     rOPINIRole  (REQUIP ) 1 MG tablet Take 1-2 mg by mouth See admin instructions. Take 1 mg at by mouth in the afternoon and take 2 mg at night (Patient not taking: Reported on 10/25/2023)     Turmeric 500 MG TABS Take 500 mg by mouth daily. (Patient not taking: Reported on 10/25/2023)     No current facility-administered  medications for this visit.    Past Medical History:  Diagnosis Date   Arthritis    Carpal tunnel syndrome    Chronic kidney disease, stage 3a (HCC)    Diverticulitis    DM type 2 (diabetes mellitus, type 2) (HCC)    GERD (gastroesophageal reflux disease)    Gout    History of colon polyps    2015 polyp x 1 dysplastic and adenomatous pieces.   Hypercholesterolemia    Hypertension    Insomnia    Prostate cancer (HCC)    Prostate enlargement    Restless leg syndrome    Seasonal allergies    Skin cancer     Past Surgical History:  Procedure Laterality Date   APPENDECTOMY  CARPAL TUNNEL RELEASE Left 08/12/2021   COLONOSCOPY  04/25/2013   Glen Guerrero- polypectomy x 1 pieces dysplastic and adenomatous; Recc repeat in 2020 (5 years).   COLONOSCOPY N/A 12/05/2017   Procedure: COLONOSCOPY;  Surgeon: Glen Glen HERO, MD;  Location: [facility];  Service: Endoscopy;  Laterality: N/A;  11:15am   HERNIA REPAIR     inguinal x2   Nalu Nuerostimulator  05/2022   POLYPECTOMY  12/05/2017   Procedure: POLYPECTOMY;  Surgeon: Glen Glen HERO, MD;  Location: [facility];  Service: Endoscopy;;  colon    TONSILLECTOMY     TOTAL SHOULDER ARTHROPLASTY Right 02/17/2023   Procedure: TOTAL SHOULDER ARTHROPLASTY;  Surgeon: Sharl Selinda Dover, MD;  Location: [facility];  Service: Orthopedics;  Laterality: Right;   TRIGGER FINGER RELEASE      Family History  Problem Relation Age of Onset   Hypertension Mother    Heart failure Mother    Prostate cancer Father    Hypertension Child    Hypertension Child    Hypertension Child    High Cholesterol Child    High Cholesterol Child    High Cholesterol Child    Cancer Paternal Aunt        unknown type   Cancer Paternal Aunt        unknown type   Colon cancer Neg Hx     Allergies as of 10/25/2023 - Review Complete 10/25/2023  Allergen Reaction Noted   Mirapex  [pramipexole dihydrochloride] Other (See Comments) 02/12/2019   Other Other  (See Comments) 02/12/2019   Pramipexole Other (See Comments) 02/26/2019   Ciprofloxacin Nausea Only 05/19/2014   Flagyl [metronidazole] Other (See Comments) 05/19/2014   Ibuprofen  05/31/2019   Propranolol Diarrhea 10/09/2021   Tramadol Nausea And Vomiting 10/24/2017    Social History   Socioeconomic History   Marital status: Married    Spouse name: Glen Guerrero   Number of children: 3   Years of education: Not on file   Highest education level: Not on file  Occupational History   Not on file  Tobacco Use   Smoking status: Former    Types: Cigars    Start date: 04/12/1969    Quit date: 04/25/2021    Years since quitting: 2.5   Smokeless tobacco: Former    Quit date: 05/20/1979   Tobacco comments:    5 cigars per week  Vaping Use   Vaping status: Never Used  Substance and Sexual Activity   Alcohol use: Not Currently   Drug use: Yes    Comment: has a cookie/brownie with marijuana in it nightly that helps him sleep   Sexual activity: Yes  Other Topics Concern   Not on file  Social History Narrative   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   Right handed.    Social Drivers of Corporate investment banker Strain: Low Risk  (07/10/2019)   [facility]   Overall Financial Resource Strain (CARDIA)    Difficulty of Paying Living Expenses: Not hard at all  Food Insecurity: No Food Insecurity (02/17/2023)   Hunger Vital Sign    Worried About Running Out of Food in the Last Year: Never true    Ran Out of Food in the Last Year: Never true  Transportation Needs: No Transportation Needs (02/17/2023)   PRAPARE - Administrator, Civil Service (Medical): No    Lack of Transportation (  Non-Medical): No  Physical Activity: Unknown (07/10/2019)   Received from [facility]   Exercise Vital Sign    On average, how many days per week do you engage in moderate to strenuous exercise (like a brisk walk)?:  Patient declined    On average, how many minutes do you engage in exercise at this level?: Patient declined  Stress: No Stress Concern Present (07/10/2019)   Received from [facility] of Occupational Health - Occupational Stress Questionnaire    Feeling of Stress : Only a little  Social Connections: Unknown (07/10/2019)   Received from [facility]   Social Connection and Isolation Panel    In a typical week, how many times do you talk on the phone with family, friends, or neighbors?: Patient declined    How often do you get together with friends or relatives?: Patient declined    How often do you attend church or religious services?: Patient declined    Do you belong to any clubs or organizations such as church groups, unions, fraternal or athletic groups, or school groups?: Patient declined    How often do you attend meetings of the clubs or organizations you belong to?: Patient declined    Are you married, widowed, divorced, separated, never married, or living with a partner?: Patient declined     Review of Systems   Gen: Denies fever, chills, anorexia. Denies fatigue, weakness, weight loss.  CV: Denies chest pain, palpitations, syncope, peripheral edema, and claudication. Resp: Denies dyspnea at rest, cough, wheezing, coughing up blood, and pleurisy. GI: See HPI Derm: Denies rash, itching, dry skin Psych: Denies depression, anxiety, memory loss, confusion. No homicidal or suicidal ideation.  Heme: Denies bruising, bleeding, and enlarged lymph nodes.  Physical Exam   BP 139/76   Pulse (!) 56   Temp 98.6 F (37 C)   Ht 6' (1.829 m)   Wt 224 lb (101.6 kg)   BMI 30.38 kg/m   General:   Alert and oriented. No distress noted. Pleasant and cooperative.  Head:  Normocephalic and atraumatic. Eyes:  Conjuctiva clear without scleral icterus. Mouth:  Oral mucosa pink and moist. Good dentition. No lesions. Rectal: deferred Msk:  Symmetrical without gross  deformities. Normal posture. Extremities:  Without edema. Neurologic:  Alert and  oriented x4 Psych:  Alert and cooperative. Normal mood and affect.  Assessment  Glen Bouffard. is a 78 y.o. male presenting today to discuss need for follow-up colonoscopy.  Exocrine pancreatic insufficiency: - History of pancreatic elastase low but 145, rechecked and August 2024 was normal at 538. - History of diarrhea although this resolved with cessation of propranolol - Not on pancreatic enzyme replacement currently.  - Currently without diarrhea, weight loss, or abdominal pain.  No alarm symptoms present.  Prior workup without concern for pancreatic malignancy or chronic pancreatitis.  History of colon polyps: Last colonoscopy in 2019 with tubular adenoma removed.  7-year repeat was recommended.  Per review of notes, Dr. Ivonne last recommendation was that despite no bowel obstruction and continue to that surveillance colonoscopy in 2026 as originally planned we discussed the current guideline recommendations.  Currently in good health good health remains with complete next year given no current alarm symptoms.  He is in agreement with this plan.  Will relay these recommendations to PCP - Dr. Shona.   PLAN   To monitor for weight loss, lack of appetite, diarrhea. Plan for colonoscopy 26. Follow up in 1 year  to discuss scheduling surveillance colonoscopy.    Charmaine Melia, MSN, FNP-BC, AGACNP-BC [facility]

## 2023-10-25 NOTE — Patient Instructions (Addendum)
 It was a pleasure to meet you today! I hope your shoulder recovery continues to go smoothly!  Given you are not having any alarm symptoms and doing well overall, I agree with continuing with the original plan for colonoscopy next year as long as your health permits.  We will send our office note to Dr. Shona to update him that colonoscopy is not needed until next year.  We can go ahead and set up a 1 year follow-up to check and to make sure everything is still well and discuss any recent medication changes that way we can get you scheduled for colonoscopy at that time.   I want to create trusting relationships with patients. If you receive a survey regarding your visit,  I greatly appreciate you taking time to fill this out on paper or through your MyChart. I value your feedback.  Charmaine Melia, MSN, FNP-BC, AGACNP-BC [facility]

## 2023-11-01 ENCOUNTER — Other Ambulatory Visit (HOSPITAL_COMMUNITY): Payer: Self-pay | Admitting: Internal Medicine

## 2023-11-01 DIAGNOSIS — R519 Headache, unspecified: Secondary | ICD-10-CM

## 2023-11-17 ENCOUNTER — Other Ambulatory Visit (HOSPITAL_COMMUNITY): Payer: Self-pay | Admitting: Internal Medicine

## 2023-11-17 ENCOUNTER — Ambulatory Visit (HOSPITAL_COMMUNITY)
Admission: RE | Admit: 2023-11-17 | Discharge: 2023-11-17 | Disposition: A | Discharge status: Home / Self Care | Source: Ambulatory Visit | Attending: Internal Medicine | Admitting: Internal Medicine

## 2023-11-17 DIAGNOSIS — R519 Headache, unspecified: Secondary | ICD-10-CM | POA: Insufficient documentation

## 2023-12-20 ENCOUNTER — Encounter: Payer: Self-pay | Admitting: Internal Medicine

## 2023-12-28 ENCOUNTER — Encounter: Payer: Self-pay | Admitting: Neurology

## 2023-12-28 NOTE — Telephone Encounter (Signed)
 Glen Guerrero, call him and find out what the symptoms are.  Last visit, he had a lot of different physicians trying to treat the same issues (he had a pain management physician, sleep physician, primary care physician and myself) and it was just too many people and I did not want to try to insert myself into that.  Find out if the issue is paresthesias in the feet or loss of balance.  If it is loss of balance, there is really no medicine that helps this, short of physical therapy.

## 2023-12-28 NOTE — Telephone Encounter (Signed)
Last seen 6 months ago

## 2024-01-01 NOTE — Telephone Encounter (Signed)
 Called patient he said first his restless legs are 99% better Lack of Balance 30% Issue is parenthesis in his feet and legs when he has been walking around and he sits down and takes pressure off of his feet it feels like little needles all in his feet and legs and it is very difficult for him

## 2024-01-04 ENCOUNTER — Other Ambulatory Visit (HOSPITAL_COMMUNITY): Payer: Self-pay | Admitting: Pain Medicine

## 2024-01-04 DIAGNOSIS — M5416 Radiculopathy, lumbar region: Secondary | ICD-10-CM

## 2024-01-12 ENCOUNTER — Encounter (HOSPITAL_COMMUNITY): Payer: Self-pay

## 2024-01-12 ENCOUNTER — Other Ambulatory Visit: Payer: Self-pay

## 2024-01-12 ENCOUNTER — Emergency Department (HOSPITAL_COMMUNITY)
Admission: EM | Admit: 2024-01-12 | Discharge: 2024-01-12 | Disposition: A | Discharge status: Home / Self Care | Attending: Emergency Medicine | Admitting: Emergency Medicine

## 2024-01-12 ENCOUNTER — Ambulatory Visit (HOSPITAL_COMMUNITY)
Admission: RE | Admit: 2024-01-12 | Discharge: 2024-01-12 | Disposition: A | Discharge status: Home / Self Care | Source: Ambulatory Visit | Attending: Pain Medicine | Admitting: Pain Medicine

## 2024-01-12 DIAGNOSIS — M5416 Radiculopathy, lumbar region: Secondary | ICD-10-CM

## 2024-01-12 DIAGNOSIS — Z7982 Long term (current) use of aspirin: Secondary | ICD-10-CM | POA: Diagnosis not present

## 2024-01-12 DIAGNOSIS — K625 Hemorrhage of anus and rectum: Secondary | ICD-10-CM | POA: Diagnosis present

## 2024-01-12 DIAGNOSIS — K922 Gastrointestinal hemorrhage, unspecified: Secondary | ICD-10-CM | POA: Diagnosis not present

## 2024-01-12 LAB — CBC
HCT: 46.2 % (ref 39.0–52.0)
Hemoglobin: 15.6 g/dL (ref 13.0–17.0)
MCH: 31 pg (ref 26.0–34.0)
MCHC: 33.8 g/dL (ref 30.0–36.0)
MCV: 91.8 fL (ref 80.0–100.0)
Platelets: 225 K/uL (ref 150–400)
RBC: 5.03 MIL/uL (ref 4.22–5.81)
RDW: 13.9 % (ref 11.5–15.5)
WBC: 6.3 K/uL (ref 4.0–10.5)
nRBC: 0 % (ref 0.0–0.2)

## 2024-01-12 LAB — TYPE AND SCREEN
ABO/RH(D): O POS
Antibody Screen: NEGATIVE

## 2024-01-12 LAB — COMPREHENSIVE METABOLIC PANEL WITH GFR
ALT: 27 U/L (ref 0–44)
AST: 22 U/L (ref 15–41)
Albumin: 3.9 g/dL (ref 3.5–5.0)
Alkaline Phosphatase: 44 U/L (ref 38–126)
Anion gap: 12 (ref 5–15)
BUN: 40 mg/dL — ABNORMAL HIGH (ref 8–23)
CO2: 25 mmol/L (ref 22–32)
Calcium: 9.4 mg/dL (ref 8.9–10.3)
Chloride: 102 mmol/L (ref 98–111)
Creatinine, Ser: 1.9 mg/dL — ABNORMAL HIGH (ref 0.61–1.24)
GFR, Estimated: 36 mL/min — ABNORMAL LOW (ref >60)
Glucose, Bld: 180 mg/dL — ABNORMAL HIGH (ref 70–99)
Potassium: 4.3 mmol/L (ref 3.5–5.1)
Sodium: 139 mmol/L (ref 135–145)
Total Bilirubin: 1 mg/dL (ref 0.0–1.2)
Total Protein: 6.3 g/dL — ABNORMAL LOW (ref 6.5–8.1)

## 2024-01-12 MED ORDER — SODIUM CHLORIDE 0.9 % IV BOLUS
1000.0000 mL | Freq: Once | INTRAVENOUS | Status: AC
  Administered 2024-01-12: 1000 mL via INTRAVENOUS

## 2024-01-12 MED ORDER — PANTOPRAZOLE SODIUM 40 MG IV SOLR
40.0000 mg | Freq: Once | INTRAVENOUS | Status: AC
Start: 2024-01-12 — End: 2024-01-12
  Administered 2024-01-12: 40 mg via INTRAVENOUS
  Filled 2024-01-12: qty 10

## 2024-01-12 MED ORDER — PANTOPRAZOLE SODIUM 20 MG PO TBEC: 20.0000 mg | DELAYED_RELEASE_TABLET | Freq: Two times a day (BID) | ORAL | 0 refills | Status: AC

## 2024-01-12 NOTE — ED Triage Notes (Signed)
 Pt stated that he has been on prednisone and is now having black stools. Started 4-5 days ago

## 2024-01-12 NOTE — ED Provider Notes (Signed)
 [place] EMERGENCY DEPARTMENT AT [facility] Provider Note   CSN: [id number] Arrival date & time: 01/12/24  1257     Patient presents with: Melena   Glen Guerrero. is a 78 y.o. male.   Patient is on aspirin  and has been on prednisone and started to have some black stools.  This has been going on for a few days but today the stool is not quite as black.  The history is provided by the patient and medical records. No language interpreter was used.  Rectal Bleeding Quality:  Black and tarry Amount:  Moderate Timing:  Constant Chronicity:  New Context: not anal fissures   Similar prior episodes: no   Relieved by:  Nothing Ineffective treatments:  None tried Associated symptoms: no abdominal pain        Prior to Admission medications   Medication Sig Start Date End Date Taking? Authorizing Provider  pantoprazole  (PROTONIX ) 20 MG tablet Take 1 tablet (20 mg total) by mouth 2 (two) times daily. 01/12/24  Yes Suzette Pac, MD  amLODipine  (NORVASC ) 10 MG tablet Take 10 mg by mouth daily.    [provider]  aspirin  EC 81 MG tablet Take 81 mg by mouth daily.    [provider]  atorvastatin  (LIPITOR) 20 MG tablet Take 20 mg by mouth daily.    [provider]  BERBERINE CHLORIDE PO Take 600 mg by mouth 2 (two) times daily. 11/29/21   [provider]  CHERRY PO Take 1,000 mg by mouth 2 (two) times daily.    [provider]  ezetimibe  (ZETIA ) 10 MG tablet Take 10 mg by mouth daily.    [provider]  fenofibrate micronized (LOFIBRA) 134 MG capsule Take 134 mg by mouth daily. 01/29/19   [provider]  Finerenone (KERENDIA) 10 MG TABS Take 10 mg by mouth every other day.    [provider]  fluorouracil (EFUDEX) 5 % cream Apply 1 application  topically daily as needed (irritation).    [provider]  hydrALAZINE  (APRESOLINE ) 50 MG tablet Take 50 mg by mouth 2 (two) times daily. 05/14/21  10/25/23  [provider]  JARDIANCE  25 MG TABS tablet Take 25 mg by mouth daily. 08/19/17   [provider]  LANTUS SOLOSTAR 100 UNIT/ML Solostar Pen Inject 38 Units into the skin 2 (two) times daily. Patient taking differently: Inject 41 Units into the skin 2 (two) times daily.    [provider]  NON FORMULARY Uses CPAP nightly    [provider]  NOVOLOG  FLEXPEN 100 UNIT/ML FlexPen Inject 8 Units into the skin 3 (three) times daily with meals. 08/02/21   [provider]  Omega-3 Fatty Acids (FISH OIL) 1000 MG CAPS Take 1,000 mg by mouth in the morning and at bedtime. Patient not taking: Reported on 10/25/2023    [provider]  pregabalin  (LYRICA ) 50 MG capsule Take 50 mg by mouth in the morning and at bedtime. 08/02/21   [provider]  Probiotic Product (PHILLIPS COLON HEALTH) CAPS Take 1 capsule by mouth daily.    [provider]  rOPINIRole  (REQUIP ) 1 MG tablet Take 1-2 mg by mouth See admin instructions. Take 1 mg at by mouth in the afternoon and take 2 mg at night Patient not taking: Reported on 10/25/2023    [provider]  Turmeric 500 MG TABS Take 500 mg by mouth daily. Patient not taking: Reported on 10/25/2023    [provider]  valsartan (DIOVAN) 40 MG tablet Take 40 mg by mouth daily. 07/21/21   [provider]    Allergies: Mirapex  [pramipexole dihydrochloride], Other, Pramipexole, Ciprofloxacin, Flagyl [metronidazole], Ibuprofen, Propranolol, and Tramadol    Review of Systems  Constitutional:  Negative for appetite change and fatigue.  HENT:  Negative for congestion, ear discharge and sinus pressure.   Eyes:  Negative for discharge.  Respiratory:  Negative for cough.   Cardiovascular:  Negative for chest pain.  Gastrointestinal:  Positive for hematochezia. Negative for abdominal pain and diarrhea.       Black stools  Genitourinary:  Negative for frequency and hematuria.   Musculoskeletal:  Negative for back pain.  Skin:  Negative for rash.  Neurological:  Negative for seizures and headaches.  Psychiatric/Behavioral:  Negative for hallucinations.     Updated Vital Signs BP (!) 144/86 (BP Location: Right Arm)   Pulse 70   Temp 98 F (36.7 C)   Resp 17   Ht 6' (1.829 m)   Wt 99.8 kg   SpO2 97%   BMI 29.84 kg/m   Physical Exam Vitals and nursing note reviewed.  Constitutional:      Appearance: He is well-developed.  HENT:     Head: Normocephalic.     Nose: Nose normal.  Eyes:     General: No scleral icterus.    Conjunctiva/sclera: Conjunctivae normal.  Neck:     Thyroid: No thyromegaly.  Cardiovascular:     Rate and Rhythm: Normal rate and regular rhythm.     Heart sounds: No murmur heard.    No friction rub. No gallop.  Pulmonary:     Breath sounds: No stridor. No wheezing or rales.  Chest:     Chest wall: No tenderness.  Abdominal:     General: There is no distension.     Tenderness: There is no abdominal tenderness. There is no rebound.  Genitourinary:    Comments: Rectal exam done but no stool was obtained Musculoskeletal:        General: Normal range of motion.     Cervical back: Neck supple.  Lymphadenopathy:     Cervical: No cervical adenopathy.  Skin:    Findings: No erythema or rash.  Neurological:     Mental Status: He is alert and oriented to person, place, and time.     Motor: No abnormal muscle tone.     Coordination: Coordination normal.  Psychiatric:        Behavior: Behavior normal.     (all labs ordered are listed, but only abnormal results are displayed) Labs Reviewed  COMPREHENSIVE METABOLIC PANEL WITH GFR - Abnormal; Notable for the following components:      Result Value   Glucose, Bld 180 (*)    BUN 40 (*)    Creatinine, Ser 1.90 (*)    Total Protein 6.3 (*)    GFR, Estimated 36 (*)    All other components within normal limits  CBC  POC OCCULT BLOOD, ED  TYPE AND SCREEN     EKG: None  Radiology: No results found.   Procedures   Medications Ordered in the ED  pantoprazole  (PROTONIX ) injection 40 mg (40 mg Intravenous Given 01/12/24 1345)  sodium chloride  0.9 % bolus 1,000 mL (1,000 mLs Intravenous New Bag/Given 01/12/24 1346)  Patient with most likely upper GI bleed.  He is hemodynamically stable with a normal hemoglobin.  His black stools seem to be getting better.  He will start Protonix  and hold  his aspirin  and follow-up with GI                                  Medical Decision Making Amount and/or Complexity of Data Reviewed Labs: ordered.  Risk Prescription drug management.   Upper GI bleed.  Improving, patient prescribed Protonix  and will follow-up with GI.  Patient has a stable hemoglobin and stable vitals     Final diagnoses:  None    ED Discharge Orders          Ordered    pantoprazole  (PROTONIX ) 20 MG tablet  2 times daily        01/12/24 1500               Suzette Pac, MD 01/17/24 1025

## 2024-01-12 NOTE — Discharge Instructions (Signed)
 Follow-up with your gastroenterologist next week.  Stop taking your aspirin  for now.  Return if getting worse.

## 2024-03-25 ENCOUNTER — Other Ambulatory Visit: Payer: Self-pay

## 2024-03-25 ENCOUNTER — Other Ambulatory Visit (HOSPITAL_COMMUNITY): Payer: Self-pay

## 2024-03-25 DIAGNOSIS — M5416 Radiculopathy, lumbar region: Secondary | ICD-10-CM

## 2024-04-09 NOTE — Discharge Instructions (Signed)

## 2024-04-10 ENCOUNTER — Telehealth: Payer: Self-pay

## 2024-04-10 ENCOUNTER — Other Ambulatory Visit: Payer: Self-pay

## 2024-04-10 ENCOUNTER — Inpatient Hospital Stay: Admission: RE | Admit: 2024-04-10 | Discharge: 2024-04-10

## 2024-04-10 DIAGNOSIS — M5416 Radiculopathy, lumbar region: Secondary | ICD-10-CM

## 2024-04-10 MED ORDER — MEPERIDINE HCL 50 MG/ML IJ SOLN: 50.0000 mg | Freq: Once | INTRAMUSCULAR | Status: DC | PRN

## 2024-04-10 MED ORDER — ONDANSETRON HCL 4 MG/2ML IJ SOLN: 4.0000 mg | Freq: Once | INTRAMUSCULAR | Status: DC | PRN

## 2024-04-10 MED ORDER — DIAZEPAM 5 MG PO TABS
5.0000 mg | ORAL_TABLET | Freq: Once | ORAL | Status: AC
  Administered 2024-04-10: 10:00:00 5 mg via ORAL

## 2024-04-10 MED ORDER — IOPAMIDOL (ISOVUE-M 200) INJECTION 41%
20.0000 mL | Freq: Once | INTRAMUSCULAR | Status: AC
  Administered 2024-04-10: 11:00:00 20 mL via INTRATHECAL

## 2024-04-10 NOTE — Telephone Encounter (Signed)
 Return call to pt's wife and made her aware pt's PSA and Testosterone  labs are in. Voiced understanding

## 2024-04-12 ENCOUNTER — Ambulatory Visit: Admitting: Urology

## 2024-04-12 ENCOUNTER — Other Ambulatory Visit: Payer: Self-pay | Admitting: Urology

## 2024-04-12 ENCOUNTER — Other Ambulatory Visit: Payer: Self-pay

## 2024-04-12 DIAGNOSIS — Z8546 Personal history of malignant neoplasm of prostate: Secondary | ICD-10-CM

## 2024-04-12 DIAGNOSIS — E291 Testicular hypofunction: Secondary | ICD-10-CM

## 2024-04-13 LAB — PSA: Prostate Specific Ag, Serum: 0.3 ng/mL (ref 0.0–4.0)

## 2024-04-15 ENCOUNTER — Ambulatory Visit: Payer: Self-pay

## 2024-04-29 NOTE — Telephone Encounter (Signed)
-----   Message from Belvie Clara, MD sent at 04/29/2024  1:23 PM EST ----- normal ----- Message ----- From: Sammie Exie HERO, CMA Sent: 04/15/2024   8:03 AM EST To: Belvie LITTIE Clara, MD  Please review.

## 2024-04-29 NOTE — Telephone Encounter (Signed)
 Called patient to let him know per MD McKenzie PSA is normal. Patient voiced his understanding

## 2024-05-13 ENCOUNTER — Encounter: Payer: Self-pay | Admitting: Urology

## 2024-05-15 NOTE — Telephone Encounter (Signed)
 I called patient to let him know we have faxed his labs to the [facility] in [place] patient voiced his understanding with a thank you

## 2024-05-31 ENCOUNTER — Ambulatory Visit: Admitting: Urology

## 2024-07-09 ENCOUNTER — Ambulatory Visit: Admitting: Neurology

## 2024-10-18 ENCOUNTER — Ambulatory Visit: Admitting: Urology

## 2024-12-04 ENCOUNTER — Ambulatory Visit: Admitting: Urology
